# Patient Record
Sex: Male | Born: 1935 | Race: Black or African American | Hispanic: No | Marital: Married | Smoking: Former smoker
Health system: Southern US, Community
[De-identification: ages and names within clinical notes are randomized; demographics above are authoritative.]

## PROBLEM LIST (undated history)

## (undated) DIAGNOSIS — I714 Abdominal aortic aneurysm, without rupture, unspecified: Secondary | ICD-10-CM

## (undated) DIAGNOSIS — E559 Vitamin D deficiency, unspecified: Secondary | ICD-10-CM

## (undated) DIAGNOSIS — I2510 Atherosclerotic heart disease of native coronary artery without angina pectoris: Secondary | ICD-10-CM

## (undated) DIAGNOSIS — E785 Hyperlipidemia, unspecified: Secondary | ICD-10-CM

## (undated) DIAGNOSIS — C801 Malignant (primary) neoplasm, unspecified: Secondary | ICD-10-CM

## (undated) DIAGNOSIS — I10 Essential (primary) hypertension: Secondary | ICD-10-CM

## (undated) DIAGNOSIS — E119 Type 2 diabetes mellitus without complications: Secondary | ICD-10-CM

## (undated) HISTORY — DX: Type 2 diabetes mellitus without complications: E11.9

## (undated) HISTORY — DX: Hyperlipidemia, unspecified: E78.5

## (undated) HISTORY — DX: Atherosclerotic heart disease of native coronary artery without angina pectoris: I25.10

## (undated) HISTORY — DX: Malignant (primary) neoplasm, unspecified: C80.1

## (undated) HISTORY — DX: Essential (primary) hypertension: I10

## (undated) HISTORY — DX: Abdominal aortic aneurysm, without rupture: I71.4

## (undated) HISTORY — DX: Abdominal aortic aneurysm, without rupture, unspecified: I71.40

## (undated) HISTORY — DX: Vitamin D deficiency, unspecified: E55.9

---

## 1997-04-11 HISTORY — PX: CAROTID STENT: SHX1301

## 1997-09-25 ENCOUNTER — Inpatient Hospital Stay (HOSPITAL_COMMUNITY): Admission: RE | Admit: 1997-09-25 | Discharge: 1997-10-01 | Payer: Self-pay | Admitting: Cardiology

## 2000-10-06 HISTORY — PX: HERNIA REPAIR: SHX51

## 2005-03-24 ENCOUNTER — Ambulatory Visit: Payer: Self-pay | Admitting: Family Medicine

## 2005-04-14 ENCOUNTER — Other Ambulatory Visit: Payer: Self-pay

## 2005-04-14 ENCOUNTER — Ambulatory Visit: Payer: Self-pay | Admitting: Vascular Surgery

## 2005-06-22 ENCOUNTER — Inpatient Hospital Stay: Payer: Self-pay | Admitting: Vascular Surgery

## 2009-07-27 LAB — PSA: PSA: 2.7

## 2011-03-18 ENCOUNTER — Emergency Department: Payer: Self-pay | Admitting: Emergency Medicine

## 2013-05-08 ENCOUNTER — Ambulatory Visit: Payer: Self-pay | Admitting: Otolaryngology

## 2013-05-08 LAB — CBC WITH DIFFERENTIAL/PLATELET
Basophil #: 0.1 10*3/uL (ref 0.0–0.1)
Basophil %: 1.2 %
EOS ABS: 0.2 10*3/uL (ref 0.0–0.7)
EOS PCT: 2.9 %
HCT: 42.6 % (ref 40.0–52.0)
HGB: 13.7 g/dL (ref 13.0–18.0)
LYMPHS ABS: 1.1 10*3/uL (ref 1.0–3.6)
Lymphocyte %: 13.8 %
MCH: 28.5 pg (ref 26.0–34.0)
MCHC: 32.2 g/dL (ref 32.0–36.0)
MCV: 89 fL (ref 80–100)
MONO ABS: 0.8 x10 3/mm (ref 0.2–1.0)
MONOS PCT: 9.7 %
NEUTROS PCT: 72.4 %
Neutrophil #: 5.8 10*3/uL (ref 1.4–6.5)
PLATELETS: 243 10*3/uL (ref 150–440)
RBC: 4.82 10*6/uL (ref 4.40–5.90)
RDW: 14.1 % (ref 11.5–14.5)
WBC: 8 10*3/uL (ref 3.8–10.6)

## 2013-05-08 LAB — POTASSIUM: POTASSIUM: 4.4 mmol/L (ref 3.5–5.1)

## 2013-05-09 ENCOUNTER — Ambulatory Visit: Payer: Self-pay | Admitting: Otolaryngology

## 2013-05-09 ENCOUNTER — Ambulatory Visit: Payer: Self-pay | Admitting: Hematology and Oncology

## 2013-05-12 ENCOUNTER — Ambulatory Visit: Payer: Self-pay | Admitting: Hematology and Oncology

## 2013-05-12 DIAGNOSIS — C801 Malignant (primary) neoplasm, unspecified: Secondary | ICD-10-CM

## 2013-05-12 HISTORY — DX: Malignant (primary) neoplasm, unspecified: C80.1

## 2013-05-12 HISTORY — PX: PORTACATH PLACEMENT: SHX2246

## 2013-05-16 ENCOUNTER — Ambulatory Visit: Payer: Self-pay | Admitting: Hematology and Oncology

## 2013-05-16 LAB — PATHOLOGY REPORT

## 2013-05-20 ENCOUNTER — Ambulatory Visit: Payer: Self-pay | Admitting: Vascular Surgery

## 2013-05-23 LAB — CBC CANCER CENTER
BASOS PCT: 0.9 %
Basophil #: 0.1 x10 3/mm (ref 0.0–0.1)
Eosinophil #: 0.4 x10 3/mm (ref 0.0–0.7)
Eosinophil %: 5.1 %
HCT: 41.2 % (ref 40.0–52.0)
HGB: 13.4 g/dL (ref 13.0–18.0)
LYMPHS ABS: 1.1 x10 3/mm (ref 1.0–3.6)
LYMPHS PCT: 12.8 %
MCH: 28.6 pg (ref 26.0–34.0)
MCHC: 32.4 g/dL (ref 32.0–36.0)
MCV: 89 fL (ref 80–100)
MONO ABS: 0.5 x10 3/mm (ref 0.2–1.0)
MONOS PCT: 6.3 %
NEUTROS PCT: 74.9 %
Neutrophil #: 6.2 x10 3/mm (ref 1.4–6.5)
Platelet: 237 x10 3/mm (ref 150–440)
RBC: 4.66 10*6/uL (ref 4.40–5.90)
RDW: 14.1 % (ref 11.5–14.5)
WBC: 8.3 x10 3/mm (ref 3.8–10.6)

## 2013-05-23 LAB — COMPREHENSIVE METABOLIC PANEL
Albumin: 3.5 g/dL (ref 3.4–5.0)
Alkaline Phosphatase: 65 U/L
Anion Gap: 10 (ref 7–16)
BUN: 21 mg/dL — AB (ref 7–18)
Bilirubin,Total: 0.7 mg/dL (ref 0.2–1.0)
CALCIUM: 8.6 mg/dL (ref 8.5–10.1)
Chloride: 97 mmol/L — ABNORMAL LOW (ref 98–107)
Co2: 26 mmol/L (ref 21–32)
Creatinine: 1.57 mg/dL — ABNORMAL HIGH (ref 0.60–1.30)
EGFR (African American): 48 — ABNORMAL LOW
GFR CALC NON AF AMER: 42 — AB
Glucose: 217 mg/dL — ABNORMAL HIGH (ref 65–99)
Osmolality: 276 (ref 275–301)
POTASSIUM: 4.1 mmol/L (ref 3.5–5.1)
SGOT(AST): 24 U/L (ref 15–37)
SGPT (ALT): 32 U/L (ref 12–78)
Sodium: 133 mmol/L — ABNORMAL LOW (ref 136–145)
Total Protein: 7.3 g/dL (ref 6.4–8.2)

## 2013-06-09 ENCOUNTER — Ambulatory Visit: Payer: Self-pay | Admitting: Hematology and Oncology

## 2013-06-11 LAB — CBC CANCER CENTER
Basophil #: 0.1 x10 3/mm (ref 0.0–0.1)
Basophil %: 1 %
EOS ABS: 0.3 x10 3/mm (ref 0.0–0.7)
EOS PCT: 4.4 %
HCT: 43.5 % (ref 40.0–52.0)
HGB: 14 g/dL (ref 13.0–18.0)
LYMPHS ABS: 1.8 x10 3/mm (ref 1.0–3.6)
LYMPHS PCT: 23.8 %
MCH: 28.7 pg (ref 26.0–34.0)
MCHC: 32.2 g/dL (ref 32.0–36.0)
MCV: 89 fL (ref 80–100)
MONOS PCT: 7.5 %
Monocyte #: 0.6 x10 3/mm (ref 0.2–1.0)
NEUTROS ABS: 4.8 x10 3/mm (ref 1.4–6.5)
Neutrophil %: 63.3 %
PLATELETS: 241 x10 3/mm (ref 150–440)
RBC: 4.89 10*6/uL (ref 4.40–5.90)
RDW: 14.5 % (ref 11.5–14.5)
WBC: 7.6 x10 3/mm (ref 3.8–10.6)

## 2013-06-11 LAB — COMPREHENSIVE METABOLIC PANEL
ALBUMIN: 4 g/dL (ref 3.4–5.0)
Alkaline Phosphatase: 61 U/L
Anion Gap: 13 (ref 7–16)
BILIRUBIN TOTAL: 0.8 mg/dL (ref 0.2–1.0)
BUN: 19 mg/dL — ABNORMAL HIGH (ref 7–18)
CALCIUM: 8.6 mg/dL (ref 8.5–10.1)
CHLORIDE: 95 mmol/L — AB (ref 98–107)
CO2: 25 mmol/L (ref 21–32)
Creatinine: 1.6 mg/dL — ABNORMAL HIGH (ref 0.60–1.30)
EGFR (African American): 47 — ABNORMAL LOW
EGFR (Non-African Amer.): 41 — ABNORMAL LOW
Glucose: 289 mg/dL — ABNORMAL HIGH (ref 65–99)
OSMOLALITY: 279 (ref 275–301)
Potassium: 4.2 mmol/L (ref 3.5–5.1)
SGOT(AST): 18 U/L (ref 15–37)
SGPT (ALT): 31 U/L (ref 12–78)
SODIUM: 133 mmol/L — AB (ref 136–145)
Total Protein: 7.6 g/dL (ref 6.4–8.2)

## 2013-06-18 LAB — CBC CANCER CENTER
Basophil #: 0.1 x10 3/mm (ref 0.0–0.1)
Basophil %: 0.8 %
EOS ABS: 0.3 x10 3/mm (ref 0.0–0.7)
Eosinophil %: 3.2 %
HCT: 40.9 % (ref 40.0–52.0)
HGB: 13.3 g/dL (ref 13.0–18.0)
Lymphocyte #: 1.4 x10 3/mm (ref 1.0–3.6)
Lymphocyte %: 15.9 %
MCH: 28.6 pg (ref 26.0–34.0)
MCHC: 32.5 g/dL (ref 32.0–36.0)
MCV: 88 fL (ref 80–100)
Monocyte #: 0.6 x10 3/mm (ref 0.2–1.0)
Monocyte %: 7 %
NEUTROS PCT: 73.1 %
Neutrophil #: 6.6 x10 3/mm — ABNORMAL HIGH (ref 1.4–6.5)
Platelet: 229 x10 3/mm (ref 150–440)
RBC: 4.65 10*6/uL (ref 4.40–5.90)
RDW: 14.2 % (ref 11.5–14.5)
WBC: 9 x10 3/mm (ref 3.8–10.6)

## 2013-06-18 LAB — BASIC METABOLIC PANEL
Anion Gap: 8 (ref 7–16)
BUN: 23 mg/dL — ABNORMAL HIGH (ref 7–18)
CALCIUM: 9 mg/dL (ref 8.5–10.1)
CO2: 28 mmol/L (ref 21–32)
CREATININE: 1.46 mg/dL — AB (ref 0.60–1.30)
Chloride: 93 mmol/L — ABNORMAL LOW (ref 98–107)
EGFR (African American): 53 — ABNORMAL LOW
EGFR (Non-African Amer.): 45 — ABNORMAL LOW
GLUCOSE: 253 mg/dL — AB (ref 65–99)
Osmolality: 271 (ref 275–301)
POTASSIUM: 4.2 mmol/L (ref 3.5–5.1)
Sodium: 129 mmol/L — ABNORMAL LOW (ref 136–145)

## 2013-06-25 LAB — CBC CANCER CENTER
BASOS ABS: 0 x10 3/mm (ref 0.0–0.1)
Basophil %: 0.5 %
Eosinophil #: 0.1 x10 3/mm (ref 0.0–0.7)
Eosinophil %: 1 %
HCT: 37.2 % — ABNORMAL LOW (ref 40.0–52.0)
HGB: 12.1 g/dL — AB (ref 13.0–18.0)
LYMPHS ABS: 0.7 x10 3/mm — AB (ref 1.0–3.6)
Lymphocyte %: 7.9 %
MCH: 28.5 pg (ref 26.0–34.0)
MCHC: 32.4 g/dL (ref 32.0–36.0)
MCV: 88 fL (ref 80–100)
Monocyte #: 0.7 x10 3/mm (ref 0.2–1.0)
Monocyte %: 7.8 %
Neutrophil #: 7.3 x10 3/mm — ABNORMAL HIGH (ref 1.4–6.5)
Neutrophil %: 82.8 %
Platelet: 202 x10 3/mm (ref 150–440)
RBC: 4.22 10*6/uL — ABNORMAL LOW (ref 4.40–5.90)
RDW: 13.9 % (ref 11.5–14.5)
WBC: 8.8 x10 3/mm (ref 3.8–10.6)

## 2013-06-25 LAB — BASIC METABOLIC PANEL
Anion Gap: 8 (ref 7–16)
BUN: 24 mg/dL — ABNORMAL HIGH (ref 7–18)
CALCIUM: 8.5 mg/dL (ref 8.5–10.1)
CHLORIDE: 91 mmol/L — AB (ref 98–107)
Co2: 26 mmol/L (ref 21–32)
Creatinine: 1.43 mg/dL — ABNORMAL HIGH (ref 0.60–1.30)
EGFR (African American): 54 — ABNORMAL LOW
GFR CALC NON AF AMER: 47 — AB
Glucose: 198 mg/dL — ABNORMAL HIGH (ref 65–99)
OSMOLALITY: 261 (ref 275–301)
Potassium: 4.5 mmol/L (ref 3.5–5.1)
SODIUM: 125 mmol/L — AB (ref 136–145)

## 2013-07-02 LAB — BASIC METABOLIC PANEL
ANION GAP: 9 (ref 7–16)
BUN: 29 mg/dL — ABNORMAL HIGH (ref 7–18)
CALCIUM: 8.7 mg/dL (ref 8.5–10.1)
CHLORIDE: 91 mmol/L — AB (ref 98–107)
Co2: 26 mmol/L (ref 21–32)
Creatinine: 1.61 mg/dL — ABNORMAL HIGH (ref 0.60–1.30)
EGFR (Non-African Amer.): 40 — ABNORMAL LOW
GFR CALC AF AMER: 47 — AB
Glucose: 251 mg/dL — ABNORMAL HIGH (ref 65–99)
OSMOLALITY: 268 (ref 275–301)
POTASSIUM: 4.8 mmol/L (ref 3.5–5.1)
SODIUM: 126 mmol/L — AB (ref 136–145)

## 2013-07-02 LAB — CBC CANCER CENTER
Basophil #: 0 x10 3/mm (ref 0.0–0.1)
Basophil %: 0.6 %
EOS PCT: 1.4 %
Eosinophil #: 0.1 x10 3/mm (ref 0.0–0.7)
HCT: 37.9 % — ABNORMAL LOW (ref 40.0–52.0)
HGB: 12.3 g/dL — AB (ref 13.0–18.0)
Lymphocyte #: 0.7 x10 3/mm — ABNORMAL LOW (ref 1.0–3.6)
Lymphocyte %: 9.6 %
MCH: 28.7 pg (ref 26.0–34.0)
MCHC: 32.4 g/dL (ref 32.0–36.0)
MCV: 89 fL (ref 80–100)
MONO ABS: 0.5 x10 3/mm (ref 0.2–1.0)
MONOS PCT: 7.4 %
NEUTROS ABS: 5.7 x10 3/mm (ref 1.4–6.5)
Neutrophil %: 81 %
Platelet: 203 x10 3/mm (ref 150–440)
RBC: 4.28 10*6/uL — ABNORMAL LOW (ref 4.40–5.90)
RDW: 14 % (ref 11.5–14.5)
WBC: 7 x10 3/mm (ref 3.8–10.6)

## 2013-07-05 LAB — CBC CANCER CENTER
Basophil #: 0 x10 3/mm (ref 0.0–0.1)
Basophil %: 0.6 %
EOS PCT: 1.5 %
Eosinophil #: 0.1 x10 3/mm (ref 0.0–0.7)
HCT: 36.7 % — AB (ref 40.0–52.0)
HGB: 11.8 g/dL — ABNORMAL LOW (ref 13.0–18.0)
LYMPHS ABS: 0.3 x10 3/mm — AB (ref 1.0–3.6)
Lymphocyte %: 7.1 %
MCH: 28.6 pg (ref 26.0–34.0)
MCHC: 32.2 g/dL (ref 32.0–36.0)
MCV: 89 fL (ref 80–100)
Monocyte #: 0.5 x10 3/mm (ref 0.2–1.0)
Monocyte %: 9.7 %
Neutrophil #: 3.9 x10 3/mm (ref 1.4–6.5)
Neutrophil %: 81.1 %
Platelet: 164 x10 3/mm (ref 150–440)
RBC: 4.13 10*6/uL — AB (ref 4.40–5.90)
RDW: 14 % (ref 11.5–14.5)
WBC: 4.8 x10 3/mm (ref 3.8–10.6)

## 2013-07-05 LAB — BASIC METABOLIC PANEL
ANION GAP: 10 (ref 7–16)
BUN: 29 mg/dL — ABNORMAL HIGH (ref 7–18)
CO2: 27 mmol/L (ref 21–32)
Calcium, Total: 8.8 mg/dL (ref 8.5–10.1)
Chloride: 89 mmol/L — ABNORMAL LOW (ref 98–107)
Creatinine: 1.57 mg/dL — ABNORMAL HIGH (ref 0.60–1.30)
EGFR (African American): 48 — ABNORMAL LOW
EGFR (Non-African Amer.): 42 — ABNORMAL LOW
Glucose: 201 mg/dL — ABNORMAL HIGH (ref 65–99)
OSMOLALITY: 265 (ref 275–301)
POTASSIUM: 4.8 mmol/L (ref 3.5–5.1)
Sodium: 126 mmol/L — ABNORMAL LOW (ref 136–145)

## 2013-07-10 ENCOUNTER — Ambulatory Visit: Payer: Self-pay | Admitting: Hematology and Oncology

## 2013-07-12 LAB — CBC CANCER CENTER
BASOS ABS: 0 x10 3/mm (ref 0.0–0.1)
Basophil %: 0.5 %
EOS ABS: 0.1 x10 3/mm (ref 0.0–0.7)
Eosinophil %: 2.2 %
HCT: 36.6 % — ABNORMAL LOW (ref 40.0–52.0)
HGB: 11.8 g/dL — AB (ref 13.0–18.0)
Lymphocyte #: 0.6 x10 3/mm — ABNORMAL LOW (ref 1.0–3.6)
Lymphocyte %: 17.3 %
MCH: 28.7 pg (ref 26.0–34.0)
MCHC: 32.1 g/dL (ref 32.0–36.0)
MCV: 90 fL (ref 80–100)
MONOS PCT: 14.6 %
Monocyte #: 0.5 x10 3/mm (ref 0.2–1.0)
NEUTROS PCT: 65.4 %
Neutrophil #: 2.4 x10 3/mm (ref 1.4–6.5)
Platelet: 136 x10 3/mm — ABNORMAL LOW (ref 150–440)
RBC: 4.09 10*6/uL — ABNORMAL LOW (ref 4.40–5.90)
RDW: 14.9 % — ABNORMAL HIGH (ref 11.5–14.5)
WBC: 3.7 x10 3/mm — ABNORMAL LOW (ref 3.8–10.6)

## 2013-07-12 LAB — COMPREHENSIVE METABOLIC PANEL
ALT: 20 U/L (ref 12–78)
Albumin: 3.5 g/dL (ref 3.4–5.0)
Alkaline Phosphatase: 65 U/L
Anion Gap: 10 (ref 7–16)
BILIRUBIN TOTAL: 0.3 mg/dL (ref 0.2–1.0)
BUN: 34 mg/dL — ABNORMAL HIGH (ref 7–18)
CHLORIDE: 90 mmol/L — AB (ref 98–107)
Calcium, Total: 8.9 mg/dL (ref 8.5–10.1)
Co2: 26 mmol/L (ref 21–32)
Creatinine: 1.64 mg/dL — ABNORMAL HIGH (ref 0.60–1.30)
EGFR (African American): 46 — ABNORMAL LOW
EGFR (Non-African Amer.): 39 — ABNORMAL LOW
GLUCOSE: 235 mg/dL — AB (ref 65–99)
Osmolality: 269 (ref 275–301)
Potassium: 4.8 mmol/L (ref 3.5–5.1)
SGOT(AST): 13 U/L — ABNORMAL LOW (ref 15–37)
Sodium: 126 mmol/L — ABNORMAL LOW (ref 136–145)
TOTAL PROTEIN: 7.1 g/dL (ref 6.4–8.2)

## 2013-07-19 LAB — BASIC METABOLIC PANEL
Anion Gap: 10 (ref 7–16)
BUN: 38 mg/dL — ABNORMAL HIGH (ref 7–18)
Calcium, Total: 9 mg/dL (ref 8.5–10.1)
Chloride: 91 mmol/L — ABNORMAL LOW (ref 98–107)
Co2: 26 mmol/L (ref 21–32)
Creatinine: 1.85 mg/dL — ABNORMAL HIGH (ref 0.60–1.30)
EGFR (Non-African Amer.): 34 — ABNORMAL LOW
GFR CALC AF AMER: 40 — AB
Glucose: 241 mg/dL — ABNORMAL HIGH (ref 65–99)
Osmolality: 272 (ref 275–301)
Potassium: 4.5 mmol/L (ref 3.5–5.1)
Sodium: 127 mmol/L — ABNORMAL LOW (ref 136–145)

## 2013-07-19 LAB — CBC CANCER CENTER
BASOS PCT: 0.3 %
Basophil #: 0 x10 3/mm (ref 0.0–0.1)
EOS ABS: 0 x10 3/mm (ref 0.0–0.7)
EOS PCT: 1.2 %
HCT: 36.2 % — AB (ref 40.0–52.0)
HGB: 11.8 g/dL — AB (ref 13.0–18.0)
LYMPHS ABS: 0.6 x10 3/mm — AB (ref 1.0–3.6)
Lymphocyte %: 17 %
MCH: 29.2 pg (ref 26.0–34.0)
MCHC: 32.7 g/dL (ref 32.0–36.0)
MCV: 89 fL (ref 80–100)
Monocyte #: 0.4 x10 3/mm (ref 0.2–1.0)
Monocyte %: 10.2 %
Neutrophil #: 2.6 x10 3/mm (ref 1.4–6.5)
Neutrophil %: 71.3 %
PLATELETS: 216 x10 3/mm (ref 150–440)
RBC: 4.06 10*6/uL — ABNORMAL LOW (ref 4.40–5.90)
RDW: 15 % — ABNORMAL HIGH (ref 11.5–14.5)
WBC: 3.6 x10 3/mm — ABNORMAL LOW (ref 3.8–10.6)

## 2013-08-08 LAB — CBC CANCER CENTER
Basophil #: 0 x10 3/mm (ref 0.0–0.1)
Basophil %: 0.3 %
EOS ABS: 0.1 x10 3/mm (ref 0.0–0.7)
Eosinophil %: 1.7 %
HCT: 34 % — ABNORMAL LOW (ref 40.0–52.0)
HGB: 11.5 g/dL — ABNORMAL LOW (ref 13.0–18.0)
LYMPHS PCT: 11.2 %
Lymphocyte #: 0.7 x10 3/mm — ABNORMAL LOW (ref 1.0–3.6)
MCH: 30.1 pg (ref 26.0–34.0)
MCHC: 33.9 g/dL (ref 32.0–36.0)
MCV: 89 fL (ref 80–100)
MONO ABS: 0.7 x10 3/mm (ref 0.2–1.0)
Monocyte %: 11.2 %
NEUTROS ABS: 4.4 x10 3/mm (ref 1.4–6.5)
Neutrophil %: 75.6 %
Platelet: 70 x10 3/mm — ABNORMAL LOW (ref 150–440)
RBC: 3.83 10*6/uL — AB (ref 4.40–5.90)
RDW: 16.6 % — AB (ref 11.5–14.5)
WBC: 5.9 x10 3/mm (ref 3.8–10.6)

## 2013-08-08 LAB — COMPREHENSIVE METABOLIC PANEL
ALBUMIN: 3.8 g/dL (ref 3.4–5.0)
Alkaline Phosphatase: 71 U/L
Anion Gap: 10 (ref 7–16)
BUN: 41 mg/dL — AB (ref 7–18)
Bilirubin,Total: 0.5 mg/dL (ref 0.2–1.0)
CALCIUM: 9.5 mg/dL (ref 8.5–10.1)
Chloride: 93 mmol/L — ABNORMAL LOW (ref 98–107)
Co2: 26 mmol/L (ref 21–32)
Creatinine: 1.92 mg/dL — ABNORMAL HIGH (ref 0.60–1.30)
EGFR (African American): 38 — ABNORMAL LOW
EGFR (Non-African Amer.): 33 — ABNORMAL LOW
GLUCOSE: 170 mg/dL — AB (ref 65–99)
OSMOLALITY: 273 (ref 275–301)
POTASSIUM: 4.7 mmol/L (ref 3.5–5.1)
SGOT(AST): 19 U/L (ref 15–37)
SGPT (ALT): 22 U/L (ref 12–78)
Sodium: 129 mmol/L — ABNORMAL LOW (ref 136–145)
Total Protein: 7.4 g/dL (ref 6.4–8.2)

## 2013-08-09 ENCOUNTER — Ambulatory Visit: Payer: Self-pay | Admitting: Hematology and Oncology

## 2013-08-23 LAB — BASIC METABOLIC PANEL
Anion Gap: 7 (ref 7–16)
BUN: 51 mg/dL — ABNORMAL HIGH (ref 7–18)
CALCIUM: 8.8 mg/dL (ref 8.5–10.1)
Chloride: 95 mmol/L — ABNORMAL LOW (ref 98–107)
Co2: 28 mmol/L (ref 21–32)
Creatinine: 2.07 mg/dL — ABNORMAL HIGH (ref 0.60–1.30)
EGFR (African American): 35 — ABNORMAL LOW
EGFR (Non-African Amer.): 30 — ABNORMAL LOW
GLUCOSE: 152 mg/dL — AB (ref 65–99)
Osmolality: 277 (ref 275–301)
Potassium: 4.8 mmol/L (ref 3.5–5.1)
Sodium: 130 mmol/L — ABNORMAL LOW (ref 136–145)

## 2013-08-30 LAB — BASIC METABOLIC PANEL
ANION GAP: 10 (ref 7–16)
BUN: 18 mg/dL (ref 7–18)
CHLORIDE: 100 mmol/L (ref 98–107)
CO2: 26 mmol/L (ref 21–32)
Calcium, Total: 8.7 mg/dL (ref 8.5–10.1)
Creatinine: 1.51 mg/dL — ABNORMAL HIGH (ref 0.60–1.30)
EGFR (African American): 51 — ABNORMAL LOW
EGFR (Non-African Amer.): 44 — ABNORMAL LOW
Glucose: 182 mg/dL — ABNORMAL HIGH (ref 65–99)
OSMOLALITY: 278 (ref 275–301)
Potassium: 4.6 mmol/L (ref 3.5–5.1)
Sodium: 136 mmol/L (ref 136–145)

## 2013-09-05 LAB — BASIC METABOLIC PANEL
ANION GAP: 9 (ref 7–16)
BUN: 15 mg/dL (ref 7–18)
CALCIUM: 8.4 mg/dL — AB (ref 8.5–10.1)
CO2: 26 mmol/L (ref 21–32)
Chloride: 103 mmol/L (ref 98–107)
Creatinine: 1.39 mg/dL — ABNORMAL HIGH (ref 0.60–1.30)
EGFR (African American): 56 — ABNORMAL LOW
EGFR (Non-African Amer.): 48 — ABNORMAL LOW
GLUCOSE: 187 mg/dL — AB (ref 65–99)
Osmolality: 281 (ref 275–301)
POTASSIUM: 5 mmol/L (ref 3.5–5.1)
Sodium: 138 mmol/L (ref 136–145)

## 2013-09-09 ENCOUNTER — Ambulatory Visit: Payer: Self-pay | Admitting: Hematology and Oncology

## 2013-11-11 ENCOUNTER — Ambulatory Visit: Payer: Self-pay | Admitting: Hematology and Oncology

## 2013-11-14 ENCOUNTER — Ambulatory Visit: Payer: Self-pay | Admitting: Hematology and Oncology

## 2013-11-14 LAB — COMPREHENSIVE METABOLIC PANEL
ALT: 25 U/L
ANION GAP: 7 (ref 7–16)
AST: 24 U/L (ref 15–37)
Albumin: 3.9 g/dL (ref 3.4–5.0)
Alkaline Phosphatase: 63 U/L
BUN: 24 mg/dL — AB (ref 7–18)
Bilirubin,Total: 0.5 mg/dL (ref 0.2–1.0)
CALCIUM: 8.8 mg/dL (ref 8.5–10.1)
Chloride: 102 mmol/L (ref 98–107)
Co2: 26 mmol/L (ref 21–32)
Creatinine: 1.57 mg/dL — ABNORMAL HIGH (ref 0.60–1.30)
EGFR (African American): 48 — ABNORMAL LOW
GFR CALC NON AF AMER: 42 — AB
GLUCOSE: 110 mg/dL — AB (ref 65–99)
Osmolality: 275 (ref 275–301)
POTASSIUM: 4.4 mmol/L (ref 3.5–5.1)
Sodium: 135 mmol/L — ABNORMAL LOW (ref 136–145)
Total Protein: 7.2 g/dL (ref 6.4–8.2)

## 2013-11-14 LAB — CBC CANCER CENTER
Basophil #: 0.1 x10 3/mm (ref 0.0–0.1)
Basophil %: 1.1 %
EOS ABS: 0.4 x10 3/mm (ref 0.0–0.7)
Eosinophil %: 5.8 %
HCT: 36.7 % — AB (ref 40.0–52.0)
HGB: 11.7 g/dL — ABNORMAL LOW (ref 13.0–18.0)
LYMPHS ABS: 0.9 x10 3/mm — AB (ref 1.0–3.6)
LYMPHS PCT: 12.6 %
MCH: 30.7 pg (ref 26.0–34.0)
MCHC: 31.8 g/dL — ABNORMAL LOW (ref 32.0–36.0)
MCV: 96 fL (ref 80–100)
Monocyte #: 0.8 x10 3/mm (ref 0.2–1.0)
Monocyte %: 11.8 %
NEUTROS PCT: 68.7 %
Neutrophil #: 4.7 x10 3/mm (ref 1.4–6.5)
PLATELETS: 180 x10 3/mm (ref 150–440)
RBC: 3.81 10*6/uL — AB (ref 4.40–5.90)
RDW: 13 % (ref 11.5–14.5)
WBC: 6.8 x10 3/mm (ref 3.8–10.6)

## 2013-11-19 ENCOUNTER — Inpatient Hospital Stay: Payer: Self-pay | Admitting: Oncology

## 2013-11-19 LAB — BASIC METABOLIC PANEL
Anion Gap: 21 — ABNORMAL HIGH (ref 7–16)
BUN: 52 mg/dL — ABNORMAL HIGH (ref 7–18)
CALCIUM: 9.8 mg/dL (ref 8.5–10.1)
CREATININE: 4.56 mg/dL — AB (ref 0.60–1.30)
Chloride: 92 mmol/L — ABNORMAL LOW (ref 98–107)
Co2: 15 mmol/L — ABNORMAL LOW (ref 21–32)
EGFR (African American): 13 — ABNORMAL LOW
EGFR (Non-African Amer.): 11 — ABNORMAL LOW
GLUCOSE: 180 mg/dL — AB (ref 65–99)
Osmolality: 276 (ref 275–301)
POTASSIUM: 4 mmol/L (ref 3.5–5.1)
Sodium: 128 mmol/L — ABNORMAL LOW (ref 136–145)

## 2013-11-19 LAB — CBC CANCER CENTER
BASOS ABS: 0 x10 3/mm (ref 0.0–0.1)
Basophil %: 0.1 %
Eosinophil #: 0 x10 3/mm (ref 0.0–0.7)
Eosinophil %: 0 %
HCT: 45.1 % (ref 40.0–52.0)
HGB: 14.6 g/dL (ref 13.0–18.0)
LYMPHS ABS: 0.3 x10 3/mm — AB (ref 1.0–3.6)
Lymphocyte %: 5 %
MCH: 30.8 pg (ref 26.0–34.0)
MCHC: 32.4 g/dL (ref 32.0–36.0)
MCV: 95 fL (ref 80–100)
Monocyte #: 0.5 x10 3/mm (ref 0.2–1.0)
Monocyte %: 7.8 %
NEUTROS ABS: 5.5 x10 3/mm (ref 1.4–6.5)
NEUTROS PCT: 87.1 %
Platelet: 235 x10 3/mm (ref 150–440)
RBC: 4.76 10*6/uL (ref 4.40–5.90)
RDW: 13.2 % (ref 11.5–14.5)
WBC: 6.4 x10 3/mm (ref 3.8–10.6)

## 2013-11-19 LAB — MAGNESIUM: Magnesium: 2.2 mg/dL

## 2013-11-19 LAB — CLOSTRIDIUM DIFFICILE(ARMC)

## 2013-11-20 LAB — CBC WITH DIFFERENTIAL/PLATELET
Basophil #: 0 10*3/uL (ref 0.0–0.1)
Basophil %: 0.1 %
Eosinophil #: 0 10*3/uL (ref 0.0–0.7)
Eosinophil %: 0.1 %
HCT: 36 % — AB (ref 40.0–52.0)
HGB: 11.6 g/dL — ABNORMAL LOW (ref 13.0–18.0)
Lymphocyte #: 0.2 10*3/uL — ABNORMAL LOW (ref 1.0–3.6)
Lymphocyte %: 2.8 %
MCH: 30.5 pg (ref 26.0–34.0)
MCHC: 32.2 g/dL (ref 32.0–36.0)
MCV: 95 fL (ref 80–100)
MONO ABS: 0.5 x10 3/mm (ref 0.2–1.0)
Monocyte %: 8.5 %
Neutrophil #: 5.1 10*3/uL (ref 1.4–6.5)
Neutrophil %: 88.5 %
PLATELETS: 169 10*3/uL (ref 150–440)
RBC: 3.8 10*6/uL — ABNORMAL LOW (ref 4.40–5.90)
RDW: 13.3 % (ref 11.5–14.5)
WBC: 5.7 10*3/uL (ref 3.8–10.6)

## 2013-11-20 LAB — BASIC METABOLIC PANEL
Anion Gap: 11 (ref 7–16)
BUN: 57 mg/dL — ABNORMAL HIGH (ref 7–18)
CREATININE: 4.13 mg/dL — AB (ref 0.60–1.30)
Calcium, Total: 7.6 mg/dL — ABNORMAL LOW (ref 8.5–10.1)
Chloride: 106 mmol/L (ref 98–107)
Co2: 13 mmol/L — ABNORMAL LOW (ref 21–32)
EGFR (African American): 15 — ABNORMAL LOW
EGFR (Non-African Amer.): 13 — ABNORMAL LOW
Glucose: 109 mg/dL — ABNORMAL HIGH (ref 65–99)
Osmolality: 277 (ref 275–301)
Potassium: 3.4 mmol/L — ABNORMAL LOW (ref 3.5–5.1)
Sodium: 130 mmol/L — ABNORMAL LOW (ref 136–145)

## 2013-11-20 LAB — WBCS, STOOL

## 2013-11-21 LAB — COMPREHENSIVE METABOLIC PANEL WITH GFR
Albumin: 2.6 g/dL — ABNORMAL LOW
Alkaline Phosphatase: 49 U/L
Anion Gap: 5 — ABNORMAL LOW
BUN: 46 mg/dL — ABNORMAL HIGH
Bilirubin,Total: 0.3 mg/dL
Calcium, Total: 7.5 mg/dL — ABNORMAL LOW
Chloride: 110 mmol/L — ABNORMAL HIGH
Co2: 17 mmol/L — ABNORMAL LOW
Creatinine: 2.69 mg/dL — ABNORMAL HIGH
EGFR (African American): 25 — ABNORMAL LOW
EGFR (Non-African Amer.): 22 — ABNORMAL LOW
Glucose: 96 mg/dL
Osmolality: 276
Potassium: 3.1 mmol/L — ABNORMAL LOW
SGOT(AST): 22 U/L
SGPT (ALT): 19 U/L
Sodium: 132 mmol/L — ABNORMAL LOW
Total Protein: 6 g/dL — ABNORMAL LOW

## 2013-11-21 LAB — CBC WITH DIFFERENTIAL/PLATELET
Basophil #: 0 x10 3/mm 3
Basophil %: 0.3 %
Eosinophil #: 0 x10 3/mm 3
Eosinophil %: 1 %
HCT: 31.9 % — ABNORMAL LOW
HGB: 10.6 g/dL — ABNORMAL LOW
Lymphocyte %: 5.9 %
Lymphs Abs: 0.2 x10 3/mm 3 — ABNORMAL LOW
MCH: 31 pg
MCHC: 33.2 g/dL
MCV: 93 fL
Monocyte #: 0.7 "x10 3/mm "
Monocyte %: 17 %
Neutrophil #: 3 x10 3/mm 3
Neutrophil %: 75.8 %
Platelet: 146 x10 3/mm 3 — ABNORMAL LOW
RBC: 3.41 x10 6/mm 3 — ABNORMAL LOW
RDW: 12.9 %
WBC: 3.9 x10 3/mm 3

## 2013-11-25 LAB — COMPREHENSIVE METABOLIC PANEL
ALT: 48 U/L
Albumin: 3.1 g/dL — ABNORMAL LOW (ref 3.4–5.0)
Alkaline Phosphatase: 68 U/L
Anion Gap: 10 (ref 7–16)
BILIRUBIN TOTAL: 0.7 mg/dL (ref 0.2–1.0)
BUN: 32 mg/dL — ABNORMAL HIGH (ref 7–18)
CHLORIDE: 103 mmol/L (ref 98–107)
CO2: 20 mmol/L — AB (ref 21–32)
Calcium, Total: 8.8 mg/dL (ref 8.5–10.1)
Creatinine: 1.96 mg/dL — ABNORMAL HIGH (ref 0.60–1.30)
EGFR (Non-African Amer.): 32 — ABNORMAL LOW
GFR CALC AF AMER: 37 — AB
GLUCOSE: 151 mg/dL — AB (ref 65–99)
Osmolality: 276 (ref 275–301)
Potassium: 4 mmol/L (ref 3.5–5.1)
SGOT(AST): 37 U/L (ref 15–37)
Sodium: 133 mmol/L — ABNORMAL LOW (ref 136–145)
TOTAL PROTEIN: 7.2 g/dL (ref 6.4–8.2)

## 2013-11-25 LAB — CBC CANCER CENTER
BASOS ABS: 0.1 x10 3/mm (ref 0.0–0.1)
Basophil %: 0.7 %
EOS ABS: 0.3 x10 3/mm (ref 0.0–0.7)
EOS PCT: 2.7 %
HCT: 38.3 % — ABNORMAL LOW (ref 40.0–52.0)
HGB: 12.8 g/dL — ABNORMAL LOW (ref 13.0–18.0)
LYMPHS PCT: 5.8 %
Lymphocyte #: 0.6 x10 3/mm — ABNORMAL LOW (ref 1.0–3.6)
MCH: 30.5 pg (ref 26.0–34.0)
MCHC: 33.4 g/dL (ref 32.0–36.0)
MCV: 91 fL (ref 80–100)
MONO ABS: 0.8 x10 3/mm (ref 0.2–1.0)
Monocyte %: 8.1 %
Neutrophil #: 8.3 x10 3/mm — ABNORMAL HIGH (ref 1.4–6.5)
Neutrophil %: 82.7 %
Platelet: 200 x10 3/mm (ref 150–440)
RBC: 4.2 10*6/uL — AB (ref 4.40–5.90)
RDW: 13.4 % (ref 11.5–14.5)
WBC: 10 x10 3/mm (ref 3.8–10.6)

## 2013-11-28 ENCOUNTER — Ambulatory Visit: Payer: Self-pay | Admitting: Oncology

## 2013-11-28 HISTORY — PX: AXILLARY LYMPH NODE BIOPSY: SHX5737

## 2013-12-05 LAB — PATHOLOGY REPORT

## 2013-12-10 ENCOUNTER — Ambulatory Visit: Payer: Self-pay | Admitting: Hematology and Oncology

## 2013-12-10 LAB — BASIC METABOLIC PANEL
ANION GAP: 10 (ref 7–16)
BUN: 17 mg/dL (ref 7–18)
Calcium, Total: 9.1 mg/dL (ref 8.5–10.1)
Chloride: 100 mmol/L (ref 98–107)
Co2: 26 mmol/L (ref 21–32)
Creatinine: 1.63 mg/dL — ABNORMAL HIGH (ref 0.60–1.30)
EGFR (Non-African Amer.): 40 — ABNORMAL LOW
GFR CALC AF AMER: 46 — AB
GLUCOSE: 154 mg/dL — AB (ref 65–99)
Osmolality: 277 (ref 275–301)
Potassium: 4.9 mmol/L (ref 3.5–5.1)
SODIUM: 136 mmol/L (ref 136–145)

## 2013-12-10 LAB — CBC CANCER CENTER
BASOS ABS: 0 x10 3/mm (ref 0.0–0.1)
BASOS PCT: 0.6 %
Eosinophil #: 0.5 x10 3/mm (ref 0.0–0.7)
Eosinophil %: 9.3 %
HCT: 37 % — AB (ref 40.0–52.0)
HGB: 11.9 g/dL — AB (ref 13.0–18.0)
LYMPHS PCT: 12 %
Lymphocyte #: 0.6 x10 3/mm — ABNORMAL LOW (ref 1.0–3.6)
MCH: 29 pg (ref 26.0–34.0)
MCHC: 32.1 g/dL (ref 32.0–36.0)
MCV: 91 fL (ref 80–100)
MONOS PCT: 12.8 %
Monocyte #: 0.7 x10 3/mm (ref 0.2–1.0)
NEUTROS ABS: 3.4 x10 3/mm (ref 1.4–6.5)
NEUTROS PCT: 65.3 %
PLATELETS: 262 x10 3/mm (ref 150–440)
RBC: 4.09 10*6/uL — ABNORMAL LOW (ref 4.40–5.90)
RDW: 13.1 % (ref 11.5–14.5)
WBC: 5.2 x10 3/mm (ref 3.8–10.6)

## 2013-12-10 LAB — MAGNESIUM: Magnesium: 1.8 mg/dL

## 2013-12-12 LAB — HEPATIC FUNCTION PANEL
ALK PHOS: 76 U/L (ref 25–125)
ALT: 17 U/L (ref 10–40)
AST: 24 U/L (ref 14–40)
BILIRUBIN, TOTAL: 0.4 mg/dL

## 2013-12-12 LAB — CBC AND DIFFERENTIAL
HEMATOCRIT: 34 % — AB (ref 41–53)
HEMOGLOBIN: 11.2 g/dL — AB (ref 13.5–17.5)
Neutrophils Absolute: 3 /uL
PLATELETS: 293 10*3/uL (ref 150–399)
WBC: 5.6 10^3/mL

## 2013-12-12 LAB — TSH: TSH: 2.02 u[IU]/mL (ref 0.41–5.90)

## 2013-12-12 LAB — LIPID PANEL
Cholesterol: 114 mg/dL (ref 0–200)
HDL: 37 mg/dL (ref 35–70)
LDL Cholesterol: 54 mg/dL
LDL/HDL RATIO: 1.5
TRIGLYCERIDES: 116 mg/dL (ref 40–160)

## 2013-12-17 ENCOUNTER — Encounter: Payer: Self-pay | Admitting: General Surgery

## 2013-12-17 LAB — URINALYSIS, COMPLETE
BILIRUBIN, UR: NEGATIVE
BLOOD: NEGATIVE
Glucose,UR: NEGATIVE mg/dL (ref 0–75)
KETONE: NEGATIVE
Nitrite: NEGATIVE
Ph: 5 (ref 4.5–8.0)
Protein: NEGATIVE
RBC,UR: 2 /HPF (ref 0–5)
Specific Gravity: 1.021 (ref 1.003–1.030)
Squamous Epithelial: <1
WBC UR: 3 /HPF (ref 0–5)

## 2013-12-20 ENCOUNTER — Ambulatory Visit: Payer: Self-pay | Admitting: Vascular Surgery

## 2013-12-25 ENCOUNTER — Encounter: Payer: Self-pay | Admitting: General Surgery

## 2013-12-25 ENCOUNTER — Ambulatory Visit (INDEPENDENT_AMBULATORY_CARE_PROVIDER_SITE_OTHER): Payer: Medicare HMO | Admitting: General Surgery

## 2013-12-25 VITALS — BP 130/88 | HR 76 | Resp 12 | Ht 67.0 in | Wt 146.0 lb

## 2013-12-25 DIAGNOSIS — Z85819 Personal history of malignant neoplasm of unspecified site of lip, oral cavity, and pharynx: Secondary | ICD-10-CM

## 2013-12-25 DIAGNOSIS — R599 Enlarged lymph nodes, unspecified: Secondary | ICD-10-CM

## 2013-12-25 DIAGNOSIS — Z85818 Personal history of malignant neoplasm of other sites of lip, oral cavity, and pharynx: Secondary | ICD-10-CM

## 2013-12-25 DIAGNOSIS — R590 Localized enlarged lymph nodes: Secondary | ICD-10-CM

## 2013-12-25 NOTE — Patient Instructions (Signed)
Patient's surgery has been scheduled for 12-30-13 at [facility].

## 2013-12-25 NOTE — Progress Notes (Signed)
Patient ID: Geoffrey Gregory, male   DOB: 07-Jul-1932, 80 y.o.   MRN: [id number]  Chief Complaint  Patient presents with  . Other    lymphnode    HPI Geoffrey Gregory is a 80 y.o. male.  Here today for evaluation of lymph node. 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 He says there is a lymph node in left axilla that [name] found with the PET scan. Denies any pain. The biopsy completed 11-28-13 showed atypical squamous cells. It is uncertain as to where the primary may have been.   HPI  Past Medical History  Diagnosis Date  . Hyperlipidemia   . 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 Hypertension   . Diabetes mellitus without complication   . CAD (coronary artery disease)   . Vitamin D deficiency   . AAA (abdominal aortic aneurysm)     Past Surgical History  Procedure Laterality Date  . Portacath placement  Fe 2015    [name]  . Axillary lymph node biopsy Left 11-28-13    atyical squamous cells  . Carotid stent  1999  . Hernia repair Bilateral 10-06-00    inguinal/ Dr. [name]    History reviewed. No pertinent family history.  Social History History  Substance Use Topics  . Smoking status: Former Smoker -- 1.00 packs/day for 15 years    Types: Cigarettes    Quit date: 04/11/1997  . Smokeless tobacco: Never Used  . Alcohol Use: No    No Known Allergies  Current Outpatient Prescriptions  Medication Sig Dispense Refill  . acetaminophen (TYLENOL) 500 MG tablet Take 500 mg by mouth every 6 (six) hours as needed.      . Cholecalciferol (VITAMIN D3) 1000 UNITS CAPS Take 1 capsule by mouth daily.      Geoffrey Gregory lovastatin (MEVACOR) 40 MG tablet Take 40 mg by mouth at bedtime.       . metoprolol (LOPRESSOR) 50 MG tablet Take 50 mg by mouth 2 (two) times daily.       Geoffrey Gregory omeprazole (PRILOSEC) 40 MG capsule Take 40 mg by mouth daily.       . prochlorperazine (COMPAZINE) 10 MG tablet Take 10 mg by mouth every 6 (six) hours as needed for nausea or vomiting.       No  current facility-administered medications for this visit.    Review of Systems Review of Systems  Constitutional: Positive for appetite change.  Respiratory: Negative.   Cardiovascular: Negative.     Blood pressure 130/88, pulse 76, resp. rate 12, height 5\' 7"  (1.702 m), weight 146 lb (66.225 kg).  Physical Exam Physical Exam  Constitutional: He is oriented to person, place, and time. He appears well-developed and well-nourished.  Eyes: Conjunctivae are normal. No scleral icterus.  Neck: Neck supple.  Cardiovascular: Normal rate, regular rhythm and normal heart sounds.   Pulmonary/Chest: Effort normal and breath sounds normal.  Lymphadenopathy:    He has no cervical adenopathy.    He has axillary adenopathy.  2 cm firm mobile lymph node left axilla  Neurological: He is alert and oriented to person, place, and time.  Skin: Skin is warm and dry.    Data Reviewed Office notes, PET scan and pathology reviewed.  Assessment    Lymphadenopathy left axilla. This case ws discussed in Tumor Case coneference and consensus was to excise the left axillary node for more detailed pathologic analysis including tumor markers.    Plan  Plan for excision left axillary node. Procedure explained to pt.     Patient's surgery has been scheduled for 12-30-13 at [facility].   Gloriana Piltz G 12/25/2013, 10:06 AM

## 2013-12-26 ENCOUNTER — Ambulatory Visit: Payer: Self-pay | Admitting: General Surgery

## 2013-12-30 ENCOUNTER — Ambulatory Visit: Payer: Self-pay | Admitting: General Surgery

## 2013-12-30 DIAGNOSIS — C099 Malignant neoplasm of tonsil, unspecified: Secondary | ICD-10-CM

## 2013-12-30 HISTORY — PX: LYMPH NODE DISSECTION: SHX5087

## 2014-01-01 ENCOUNTER — Encounter: Payer: Self-pay | Admitting: General Surgery

## 2014-01-02 LAB — STOOL CULTURE

## 2014-01-02 LAB — PATHOLOGY REPORT

## 2014-01-03 ENCOUNTER — Encounter: Payer: Self-pay | Admitting: General Surgery

## 2014-01-07 ENCOUNTER — Encounter: Payer: Self-pay | Admitting: General Surgery

## 2014-01-07 ENCOUNTER — Ambulatory Visit (INDEPENDENT_AMBULATORY_CARE_PROVIDER_SITE_OTHER): Payer: Self-pay | Admitting: General Surgery

## 2014-01-07 VITALS — BP 110/76 | HR 74 | Resp 12 | Ht 67.0 in | Wt 145.0 lb

## 2014-01-07 DIAGNOSIS — R590 Localized enlarged lymph nodes: Secondary | ICD-10-CM

## 2014-01-07 DIAGNOSIS — R599 Enlarged lymph nodes, unspecified: Secondary | ICD-10-CM

## 2014-01-07 NOTE — Progress Notes (Signed)
This is a 80 year old male here today for his post op excision axilla node on 12-30-13. States he is doing well.  Small seroma left axilla. Pathology showed benign lymph node with no metastic disease. Pt advised.  Follow up in one month.

## 2014-01-07 NOTE — Patient Instructions (Addendum)
Follow up in one month Heating pad to axilla as needed

## 2014-01-08 ENCOUNTER — Encounter: Payer: Self-pay | Admitting: General Surgery

## 2014-01-09 ENCOUNTER — Ambulatory Visit: Payer: Self-pay | Admitting: Hematology and Oncology

## 2014-01-21 DIAGNOSIS — K219 Gastro-esophageal reflux disease without esophagitis: Secondary | ICD-10-CM | POA: Insufficient documentation

## 2014-01-21 DIAGNOSIS — I6529 Occlusion and stenosis of unspecified carotid artery: Secondary | ICD-10-CM | POA: Insufficient documentation

## 2014-02-05 ENCOUNTER — Encounter: Payer: Medicare HMO | Admitting: General Surgery

## 2014-02-05 NOTE — Progress Notes (Signed)
This encounter was created in error - please disregard.

## 2014-02-11 ENCOUNTER — Ambulatory Visit (INDEPENDENT_AMBULATORY_CARE_PROVIDER_SITE_OTHER): Payer: Self-pay | Admitting: General Surgery

## 2014-02-11 ENCOUNTER — Encounter: Payer: Self-pay | Admitting: General Surgery

## 2014-02-11 VITALS — BP 118/82 | HR 78 | Resp 14 | Ht 67.0 in | Wt 144.0 lb

## 2014-02-11 DIAGNOSIS — R590 Localized enlarged lymph nodes: Secondary | ICD-10-CM

## 2014-02-11 DIAGNOSIS — Z85819 Personal history of malignant neoplasm of unspecified site of lip, oral cavity, and pharynx: Secondary | ICD-10-CM

## 2014-02-11 DIAGNOSIS — Z85818 Personal history of malignant neoplasm of other sites of lip, oral cavity, and pharynx: Secondary | ICD-10-CM

## 2014-02-11 NOTE — Progress Notes (Signed)
This is a 80 year old male here today for his post op excision axilla node on 12-30-13. States he is doing well.  Pathology showed benign lymph node with no metastatic disease. Next appointment with Dr [name] January 2016 with CT scan. Seroma left axilla resolved. Incision  well healed. No cervical or axillary adenopathy.   Follow up as needed.

## 2014-02-11 NOTE — Patient Instructions (Signed)
The patient is aware to call back for any questions or concerns.  

## 2014-02-27 ENCOUNTER — Ambulatory Visit: Payer: Self-pay | Admitting: Vascular Surgery

## 2014-02-27 LAB — CBC
HCT: 36.3 % — AB (ref 40.0–52.0)
HGB: 11.7 g/dL — ABNORMAL LOW (ref 13.0–18.0)
MCH: 28.8 pg (ref 26.0–34.0)
MCHC: 32.2 g/dL (ref 32.0–36.0)
MCV: 89 fL (ref 80–100)
PLATELETS: 196 10*3/uL (ref 150–440)
RBC: 4.07 10*6/uL — ABNORMAL LOW (ref 4.40–5.90)
RDW: 16.2 % — ABNORMAL HIGH (ref 11.5–14.5)
WBC: 5.8 10*3/uL (ref 3.8–10.6)

## 2014-02-27 LAB — BASIC METABOLIC PANEL
ANION GAP: 5 — AB (ref 7–16)
BUN: 21 mg/dL — ABNORMAL HIGH (ref 7–18)
CHLORIDE: 102 mmol/L (ref 98–107)
CO2: 27 mmol/L (ref 21–32)
CREATININE: 1.37 mg/dL — AB (ref 0.60–1.30)
Calcium, Total: 9.1 mg/dL (ref 8.5–10.1)
EGFR (African American): >60
GFR CALC NON AF AMER: 53 — AB
Glucose: 88 mg/dL (ref 65–99)
OSMOLALITY: 271 (ref 275–301)
Potassium: 4.9 mmol/L (ref 3.5–5.1)
Sodium: 134 mmol/L — ABNORMAL LOW (ref 136–145)

## 2014-03-12 ENCOUNTER — Inpatient Hospital Stay: Payer: Self-pay | Admitting: Vascular Surgery

## 2014-03-13 LAB — COMPREHENSIVE METABOLIC PANEL
Albumin: 2.9 g/dL — ABNORMAL LOW (ref 3.4–5.0)
Alkaline Phosphatase: 55 U/L
Anion Gap: 8 (ref 7–16)
BUN: 16 mg/dL (ref 7–18)
Bilirubin,Total: 0.8 mg/dL (ref 0.2–1.0)
Calcium, Total: 7.7 mg/dL — ABNORMAL LOW (ref 8.5–10.1)
Chloride: 103 mmol/L (ref 98–107)
Co2: 25 mmol/L (ref 21–32)
Creatinine: 1.24 mg/dL (ref 0.60–1.30)
EGFR (Non-African Amer.): 60 — ABNORMAL LOW
Glucose: 104 mg/dL — ABNORMAL HIGH (ref 65–99)
OSMOLALITY: 273 (ref 275–301)
Potassium: 4.1 mmol/L (ref 3.5–5.1)
SGOT(AST): 27 U/L (ref 15–37)
SGPT (ALT): 22 U/L
Sodium: 136 mmol/L (ref 136–145)
Total Protein: 5.8 g/dL — ABNORMAL LOW (ref 6.4–8.2)

## 2014-03-13 LAB — CBC WITH DIFFERENTIAL/PLATELET
BASOS ABS: 0 10*3/uL (ref 0.0–0.1)
Basophil %: 0.5 %
Eosinophil #: 0.2 10*3/uL (ref 0.0–0.7)
Eosinophil %: 3.5 %
HCT: 30.4 % — ABNORMAL LOW (ref 40.0–52.0)
HGB: 9.8 g/dL — AB (ref 13.0–18.0)
LYMPHS ABS: 0.6 10*3/uL — AB (ref 1.0–3.6)
LYMPHS PCT: 8.3 %
MCH: 28.8 pg (ref 26.0–34.0)
MCHC: 32.1 g/dL (ref 32.0–36.0)
MCV: 90 fL (ref 80–100)
MONO ABS: 0.7 x10 3/mm (ref 0.2–1.0)
Monocyte %: 10 %
NEUTROS ABS: 5.3 10*3/uL (ref 1.4–6.5)
Neutrophil %: 77.7 %
Platelet: 121 10*3/uL — ABNORMAL LOW (ref 150–440)
RBC: 3.39 10*6/uL — ABNORMAL LOW (ref 4.40–5.90)
RDW: 16.5 % — ABNORMAL HIGH (ref 11.5–14.5)
WBC: 6.9 10*3/uL (ref 3.8–10.6)

## 2014-03-13 LAB — MAGNESIUM: MAGNESIUM: 1.4 mg/dL — AB

## 2014-03-13 LAB — PHOSPHORUS: Phosphorus: 2.5 mg/dL (ref 2.5–4.9)

## 2014-03-13 LAB — APTT: Activated PTT: 27.7 secs (ref 23.6–35.9)

## 2014-03-13 LAB — PROTIME-INR
INR: 1.1
Prothrombin Time: 14.2 secs (ref 11.5–14.7)

## 2014-04-16 LAB — HEMOGLOBIN A1C: HEMOGLOBIN A1C: 5.5 % (ref 4.0–6.0)

## 2014-05-01 LAB — BASIC METABOLIC PANEL
BUN: 27 mg/dL — AB (ref 4–21)
Creatinine: 1.5 mg/dL — AB (ref 0.6–1.3)
Glucose: 98 mg/dL
Potassium: 4.7 mmol/L (ref 3.4–5.3)
Sodium: 131 mmol/L — AB (ref 137–147)

## 2014-05-06 ENCOUNTER — Ambulatory Visit: Payer: Self-pay | Admitting: Oncology

## 2014-05-07 ENCOUNTER — Ambulatory Visit: Payer: Self-pay | Admitting: Oncology

## 2014-05-07 LAB — CBC CANCER CENTER
Basophil #: 0 x10 3/mm (ref 0.0–0.1)
Basophil %: 0.9 %
EOS ABS: 0.5 x10 3/mm (ref 0.0–0.7)
Eosinophil %: 8.5 %
HCT: 40.4 % (ref 40.0–52.0)
HGB: 13 g/dL (ref 13.0–18.0)
LYMPHS PCT: 17.1 %
Lymphocyte #: 0.9 x10 3/mm — ABNORMAL LOW (ref 1.0–3.6)
MCH: 28.3 pg (ref 26.0–34.0)
MCHC: 32.3 g/dL (ref 32.0–36.0)
MCV: 88 fL (ref 80–100)
MONOS PCT: 8.4 %
Monocyte #: 0.5 x10 3/mm (ref 0.2–1.0)
NEUTROS ABS: 3.6 x10 3/mm (ref 1.4–6.5)
Neutrophil %: 65.1 %
Platelet: 185 x10 3/mm (ref 150–440)
RBC: 4.6 10*6/uL (ref 4.40–5.90)
RDW: 15.4 % — ABNORMAL HIGH (ref 11.5–14.5)
WBC: 5.5 x10 3/mm (ref 3.8–10.6)

## 2014-05-07 LAB — COMPREHENSIVE METABOLIC PANEL
ANION GAP: 10 (ref 7–16)
Albumin: 3.9 g/dL (ref 3.4–5.0)
Alkaline Phosphatase: 80 U/L (ref 46–116)
BUN: 23 mg/dL — AB (ref 7–18)
Bilirubin,Total: 0.6 mg/dL (ref 0.2–1.0)
CHLORIDE: 96 mmol/L — AB (ref 98–107)
Calcium, Total: 9 mg/dL (ref 8.5–10.1)
Co2: 29 mmol/L (ref 21–32)
Creatinine: 1.6 mg/dL — ABNORMAL HIGH (ref 0.60–1.30)
EGFR (Non-African Amer.): 45 — ABNORMAL LOW
GFR CALC AF AMER: 54 — AB
Glucose: 163 mg/dL — ABNORMAL HIGH (ref 65–99)
Osmolality: 277 (ref 275–301)
POTASSIUM: 4.3 mmol/L (ref 3.5–5.1)
SGOT(AST): 25 U/L (ref 15–37)
SGPT (ALT): 33 U/L (ref 14–63)
SODIUM: 135 mmol/L — AB (ref 136–145)
Total Protein: 7.6 g/dL (ref 6.4–8.2)

## 2014-05-07 LAB — LACTATE DEHYDROGENASE: LDH: 152 U/L (ref 85–241)

## 2014-05-12 ENCOUNTER — Ambulatory Visit: Payer: Self-pay | Admitting: Oncology

## 2014-08-02 NOTE — Op Note (Signed)
PATIENT NAME:  Geoffrey Gregory, Geoffrey Gregory MR#:  [id number] DATE OF BIRTH:  08-Dec-1932  DATE OF PROCEDURE:  05/20/2013  PREOPERATIVE DIAGNOSES: 1.  Tonsillar cancer. 2.  Hypertension. 3.  Diabetes. 4.  Coronary disease.  POSTOPERATIVE DIAGNOSES:  1.  Tonsillar cancer. 2.  Hypertension. 3.  Diabetes. 4.  Coronary disease.  PROCEDURES:  1.  Ultrasound guidance for vascular access, right internal jugular vein.  2.  Fluoroscopic guidance for placement of catheter.  3.  Placement of CT compatible Port-A-Cath, right internal jugular vein.   SURGEON:  Leotis Pain, MD.   ANESTHESIA:  Local with moderate conscious sedation.   FLUOROSCOPY TIME:  Less than 1 minute.   CONTRAST:  Zero.   ESTIMATED BLOOD LOSS: Minimal.   INDICATION FOR PROCEDURE:  A 81 year old male with tonsillar cancer. He needs a Port-A-Cath to start chemotherapy later this week. Risks and benefits were discussed. Informed consent was obtained.   DESCRIPTION OF THE PROCEDURE:  The patient was brought to the vascular and interventional radiology suite. The right neck and chest were sterilely prepped and draped, and a sterile surgical field was created. Ultrasound was used to help visualize a patent right internal jugular vein. This was then accessed under direct ultrasound guidance without difficulty with a Seldinger needle and a permanent image was recorded. A J-wire was placed. After skin nick and dilatation, the peel-away sheath was then placed over the wire. I then anesthetized an area under the clavicle approximately 2 fingerbreadths. A transverse incision was created and an inferior pocket was created with electrocautery and blunt dissection. The port was then brought onto the field, placed into the pocket and secured to the chest wall with 2 Prolene sutures. The catheter was connected to the port and tunneled from the subclavicular incision to the access site. Fluoroscopic guidance was used to cut the catheter to an appropriate length.  The catheter was then placed through the peel-away sheath and the peel-away sheath was removed. The catheter tip was parked in excellent location in the cavoatrial junction. The pocket was then irrigated with antibiotic-impregnated saline and the wound was closed with a running 3-0 Vicryl and a 4-0 Monocryl. The access incision was closed with a single 4-0 Monocryl. The Huber needle was used to withdraw blood and flush the port with heparinized saline. Dermabond was then placed as a dressing. The patient tolerated the procedure well and was taken to the recovery room in stable condition.   ____________________________ Algernon Huxley, MD jsd:aw D: 05/20/2013 10:08:43 ET T: 05/20/2013 12:28:39 ET JOB#: [id number]  cc: Algernon Huxley, MD, <Dictator> Algernon Huxley MD ELECTRONICALLY SIGNED 05/20/2013 13:51

## 2014-08-02 NOTE — Op Note (Signed)
PATIENT NAME:  Geoffrey Gregory, Geoffrey Gregory MR#:  [id number] DATE OF BIRTH:  [birth year]  DATE OF PROCEDURE:  03/12/2014  PREOPERATIVE DIAGNOSES:  1.  Abdominal aortic aneurysm.  2.  Atherosclerotic occlusive disease bilateral lower extremities.  3.  Hypertension.  4.  Obesity.   POSTOPERATIVE DIAGNOSES:  1.  Abdominal aortic aneurysm.  2.  Atherosclerotic occlusive disease bilateral lower extremities.  3.  Hypertension.  4.  Obesity.   PROCEDURES PERFORMED:  1.  Introduction catheter into aorta percutaneous right common femoral approach.  2.  Introduction catheter into aorta percutaneous left common femoral approach.  3.  Repair of abdominal aortic aneurysm with Gore C3 Excluder stent graft.  4.  Extension of right iliac limb with a 12 x 10 extender cuff.   SURGEONS:  Dr. Lucky Cowboy, Dr. Delana Meyer co-surgeon.   ANESTHESIA: General by endotracheal intubation.   FLUIDS: Per anesthesia record.   ESTIMATED BLOOD LOSS: Minimal.   SPECIMEN: None.   FLUOROSCOPY TIME: 30.1 minutes.   CONTRAST USED: Isovue 100 mL.   INDICATIONS: Geoffrey Gregory is a 80 year old gentleman who has been followed in the office and is now found to have an abdominal aortic aneurysm greater than 5 cm in diameter. CT angiography demonstrated anatomy acceptable for endograft placement and he is therefore undergoing endovascular repair. Risks and benefits were reviewed. All questions answered. The patient has agreed to proceed.   DESCRIPTION OF PROCEDURE: The patient is taken to the special procedure suite, placed in the supine position. After adequate general anesthesia is induced and appropriate invasive monitors are placed he is positioned supine and prepped from just below the chest to above the knees. He is then draped in a sterile fashion and appropriate timeout is called.   Ultrasound is placed in a sterile sleeve and ultrasound is utilized to assist in accessing the common femoral arteries with Dr. Lucky Cowboy working on the right side,  myself on the left. The right common femoral artery is identified. It is echolucent and pulsatile indicating patency. Image is recorded for the permanent record and Seldinger needle is then inserted into the anterior wall and a J-wire advanced. In a similar fashion the left femoral is imaged, it is patent, image is recorded, and a micropuncture needle is inserted followed by microwire, micro sheath and a J-wire.  6 French sheaths are placed.   Again working simultaneously and performing this in a similar fashion Perclose devices are utilized in a pre-close fashion, advancing one and deploying the needles at the 11 o'clock position and a second at the 1 o'clock position. Both sutures are then tagged with a curved and a straight hemostat. Sheaths are upsized to 8 French sheaths and 5000 units of heparin are given.   Pigtail catheter is then advanced up the right and abdominal aortogram is obtained, renal arteries are marked and with a marker pigtail final length measurements are made. A 26 x 12 x 18 Gore C3 main body is then prepped on the back table. The sheath is upsized to a 16 French sheath on the right over an Amplatz Super Stiff wire, which was advanced through the pigtail catheter. KMP catheter is then advanced from the left over the J-wire and positioned at the approximate level of the renals and the wire is removed. With the larger sheath in place the main body is then advanced over the wire, positioned just below the renals. Aortogram and a magnified view is obtained through the [place] catheter to more closely localize the left renal artery which  is the lower renal and the main body is then positioned and opened through the contralateral gate. KMP catheter is pulled back into the aorta, and attempts at advancing the wire through the contralateral gate are unsuccessful initially. Several different catheters and wires are utilized, ultimately advancing the 12 French sheath and utilizing a KMP catheter and  multiple different views. The gate is engaged. Glidewire is advanced following the KMP catheter. KMP catheter is then exchanged for a pigtail catheter which is twirled in the main body verifying intraluminal placement. Amplatz Super Stiff wire is then advanced through the pigtail catheter. Markers are adjusted to match the gate marker and an RAO projection of the pelvis is obtained. Length measurements are made and a 14 x 14 contralateral limb is prepped on the back table and then advanced through the 12 [place] sheath.  It is then deployed without difficulty. At this point the remaining portion of the ipsilateral limb is deployed. Small injection of contrast verifies position and distal positioning of the limb which is within the common iliac aneurysm and therefore iliac extender which is a 12 x 10 is advanced through the right-sided sheath and deployed without difficulty extending approximately 3-4 cm into the external iliac on the right.   Coda balloon is then advanced up the left sheath and angioplasty is performed throughout the main body in the left limb.  The Pry[place] Ochoa is then advanced up the right and angioplasty of the main body in the right limb is then performed. The portion of the stent within the external on the right and the common on the left remain undersized and therefore a 10 x 6 balloon is advanced up the left and an 8 x 6 is advanced up the right, balloon angioplasty is performed to 10 atmospheres bilaterally. This fully expands both limbs with significant improvement.   The pigtail catheter is then advanced up the right and final imaging is performed, a delayed type 2 endoleak from a pair of lumbars is noted, but this is quite small and likely will thrombose once the heparin has worn off. No other graft abnormalities are noted. It should be noted that the internal iliac on the right was occluded based on the initial films and therefore no embolization was needed.   The right sheath is then  removed and the pre-close device is deployed successfully, the knots are secured and pressure is held. The left sheath is removed and again the sutures are secured and light pressure is held. Both groin incisions are closed with a single interrupted 4-0 Monocryl and then Dermabond. Safeguards are applied. The patient tolerated the procedure well. There were no immediate complications. Sponge and needle counts were correct and he was taken to the recovery area in excellent condition.    ____________________________ Katha Cabal, MD ggs:bu D: 03/12/2014 19:26:48 ET T: 03/12/2014 20:17:23 ET JOB#: [id number]  cc: Katha Cabal, MD, <Dictator> Katha Cabal MD ELECTRONICALLY SIGNED 03/15/2014 16:40

## 2014-08-02 NOTE — Op Note (Signed)
PATIENT NAME:  PARKS, CZAJKOWSKI MR#:  [id number] DATE OF BIRTH:  Apr 12, 1932  DATE OF PROCEDURE:  03/12/2014  PREOPERATIVE DIAGNOSES:  1.  Abdominal aortic aneurysm.  2.  Tonsillar cancer.  3.  Coronary disease.  4.  Hyperlipidemia.  5.  Diabetes.  POSTOPERATIVE DIAGNOSES: 1.  Abdominal aortic aneurysm.  2.  Tonsillar cancer.  3.  Coronary disease.  4.  Hyperlipidemia.  5.  Diabetes.    PROCEDURES:  1.  Ultrasound guidance for vascular access of bilateral femoral arteries; right by Dr. Leotis Pain, left by Dr. Hortencia Pilar.  2.  Catheter placement of the aorta from bilateral femoral approaches; from the right by Dr. Leotis Pain and from the left by Dr. Hortencia Pilar.  3.  Aortogram and iliofemoral arteriogram.  4.  Placement of a Gore Excluder endoprosthesis, main body right, 26 mm diameter main body, 18 cm length, with a 14 mm diameter x 14 cm length contralateral limb; co-surgeons. 5.  Placement of a right iliac artery extension cuff, 12 mm diameter x 12 cm length; co-surgeons.  6.  ProGlide closure device, bilateral femoral arteries; right by Dr. Leotis Pain and left by Dr. Hortencia Pilar.   CO-SURGEONS: Algernon Huxley, MD and Katha Cabal, MD  ANESTHESIA: General.   BLOOD LOSS: 50 mL   FLUOROSCOPY TIME: 30 minutes   CONTRAST USED: 100 mL   INDICATION FOR PROCEDURE: A 81 year old gentleman with a nearly 6 cm infrarenal abdominal aortic aneurysm with appropriate anatomy for an endovascular repair. He is brought in for repair of this. Risks and benefits were discussed. Informed consent was obtained.   DESCRIPTION OF PROCEDURE: The patient was brought to the vascular suite with the help our anesthesia colleagues providing general anesthetic. His abdomen and groins were sterilely prepped and draped, and a sterile surgical field was created. After an appropriate surgical timeout and antibiotics, we used ultrasound to access the femoral arteries on each side. I did the right and Dr.  Delana Meyer did the left, and 6 French sheaths were placed. We then placed 2 ProGlide devices in a pre close fashion. Again, I did the right and Dr. Delana Meyer did the left, and 8 French sheaths were placed. Next 5000 units of intravenous heparin were given for systemic anticoagulation.   Pigtail catheter was placed up the right, and aortogram was performed. This demonstrated the expected infrarenal abdominal aortic aneurysm. The right hypogastric artery was chronically occluded, which was also expected from the CT scan, with planned extension of the stent graft down into the external iliac arteries as the aneurysm went down throughout the entirety of the common iliac artery.   I put a 32 French sheath up on the right and a 12 French sheath up on the left. A Kumpe catheter was placed through the left femoral sheath, and an aortogram was performed in a magnified fashion. The stent graft then deployed, just to the base of the left renal artery, which was lowest in a flipped configuration, using a 26 mm diameter proximal, 18 cm in length main body. Dr. Delana Meyer cannulated the contralateral gate with a mild amount of difficulty; ultimately with a Kumpe catheter and stiff angled Glidewire. We confirmed successful cannulation by putting a pigtail catheter in the main body and twirling this. Imaging was performed through this to ensure that the stent graft remaining in good location. A 14 mm diameter x 14 cm in length contralateral limb was placed down to just above the left hypogastric artery, and the main  body deployment was completed in the right iliac, which extended down to the distal common iliac artery, where there was still aneurysmal degeneration. We had to extend down with a 12 mm diameter x 12 cm length limb, several centimeters into the external iliac artery to get a good seal. All junction points and seal zones were ironed out with the compliant balloon. Due to some residual constraint in the iliac vessels, an 8  mm diameter non-compliant balloon was used on the right and a 10 mm diameter non compliant balloon was used on the left within the stent, with a good angiographic completion result.   The completion angiogram showed what appeared to be a small type II endoleak, but no type I or III endoleaks, with brisk flow through the stent graft. The left hypogastric artery remained patent, and both renal arteries remained patent.   At this point, we elected to terminate the procedure. The ProGlide devices were used to close the arteriotomy on each side. I did the right and Dr. Delana Meyer did the left. Next, 4-0 Monocryl was used to close the skin incision, and Dermabond and pressure dressings were placed.   The patient tolerated the procedure well and was taken to the recovery room in stable condition.    ____________________________ Algernon Huxley, MD jsd:MT D: 03/12/2014 15:08:01 ET T: 03/12/2014 17:02:23 ET JOB#: [id number]  cc: Algernon Huxley, MD, <Dictator> Richard L. Rosanna Randy, MD Algernon Huxley MD ELECTRONICALLY SIGNED 04/10/2014 14:12

## 2014-08-02 NOTE — Discharge Summary (Signed)
Dates of Admission and Diagnosis:  Date of Admission 19-Nov-2013   Date of Discharge 21-Nov-2013   Admitting Diagnosis acute on chronic renal failure   Final Diagnosis acute on chronic renal failure   Discharge Diagnosis 1 severe diarrhea  secondary to salmonellosis   2 dehydration   3 carcinoma of   tonsils  status post chemoradiation therapywith abnormalPET scan    Chief Complaint/History of Present Illness Patient is here for further evaluation regarding tonsilar cancer. He completed chemoradiation on 08/02/13. He recently had a PET scan with some areas of concern noted in the left axillary area. Lymph nodes are very small but light up slightly on PET scan. He follows up today for further discussion with Dr. Doylene Canning regarding biopsy versus rescanning for follow up. He reports seeing his ENT on Friday, Dr. Gertie Baron. He also states that he developed severe diarrhea and vomiting on Sunday. He has taken his compazine at home for nausea with some relief but continues with loose liquid stools, last episode this morning. He denies any fever, chills, cough, shortness of breath, or chest pain. Reports no other family memebrs have been ill. Has been very weak since Sunday.   Allergies:  No Known Allergies:   Hepatic:  13-Aug-15 05:04   Bilirubin, Total 0.3  Alkaline Phosphatase 49 (46-116 NOTE: New Reference Range 10/29/13)  SGPT (ALT) 19 (14-63 NOTE: New Reference Range 10/29/13)  SGOT (AST) 22  Total Protein, Serum  6.0  Albumin, Serum  2.6  Routine Chem:  12-Aug-15 04:22   Glucose, Serum  109  BUN  57  Creatinine (comp)  4.13  Sodium, Serum  130  Potassium, Serum  3.4  Chloride, Serum 106  CO2, Serum  13  Calcium (Total), Serum  7.6  Osmolality (calc) 277  eGFR (African American)  15  eGFR (Non-African American)  13 (eGFR values <21mL/min/1.73 m2 may be an indication of chronic kidney disease (CKD). Calculated eGFR is useful in patients with stable renal function. The  eGFR calculation will not be reliable in acutely ill patients when serum creatinine is changing rapidly. It is not useful in  patients on dialysis. The eGFR calculation may not be applicable to patients at the low and high extremes of body sizes, pregnant women, and vegetarians.)  Anion Gap 11  13-Aug-15 05:04   Result Comment labs  - This specimen was collected through an   - indwelling catheter or arterial line.  - A minimum of of blood was wasted prior    - to collecting the sample.  Interpret  - results with caution.  Result(s) reported on 21 Nov 2013 at 08:44AM.  Glucose, Serum 96  BUN  46  Creatinine (comp)  2.69  Sodium, Serum  132  Potassium, Serum  3.1  Chloride, Serum  110  CO2, Serum  17  Calcium (Total), Serum  7.5  Osmolality (calc) 276  eGFR (African American)  25  eGFR (Non-African American)  22 (eGFR values <63mL/min/1.73 m2 may be an indication of chronic kidney disease (CKD). Calculated eGFR is useful in patients with stable renal function. The eGFR calculation will not be reliable in acutely ill patients when serum creatinine is changing rapidly. It is not useful in  patients on dialysis. The eGFR calculation may not be applicable to patients at the low and high extremes of body sizes, pregnant women, and vegetarians.)  Anion Gap  5  Routine Hem:  12-Aug-15 04:22   WBC (CBC) 5.7  RBC (CBC)  3.80  Hemoglobin (CBC)  11.6  Hematocrit (CBC)  36.0  Platelet Count (CBC) 169  MCV 95  MCH 30.5  MCHC 32.2  RDW 13.3  Neutrophil % 88.5  Lymphocyte % 2.8  Monocyte % 8.5  Eosinophil % 0.1  Basophil % 0.1  Neutrophil # 5.1  Lymphocyte #  0.2  Monocyte # 0.5  Eosinophil # 0.0  Basophil # 0.0 (Result(s) reported on 20 Nov 2013 at [facility].)  13-Aug-15 05:04   WBC (CBC) 3.9  RBC (CBC)  3.41  Hemoglobin (CBC)  10.6  Hematocrit (CBC)  31.9  Platelet Count (CBC)  146  MCV 93  MCH 31.0  MCHC 33.2  RDW 12.9  Neutrophil % 75.8  Lymphocyte % 5.9   Monocyte % 17.0  Eosinophil % 1.0  Basophil % 0.3  Neutrophil # 3.0  Lymphocyte #  0.2  Monocyte # 0.7  Eosinophil # 0.0  Basophil # 0.0   PERTINENT RADIOLOGY STUDIES: [place]:    11-Aug-15 14:12, US Kidney Bilateral  US Kidney Bilateral   REASON FOR EXAM:    acute renal failure, dehydration  COMMENTS:       PROCEDURE: [place]  - US KIDNEY  - Nov 19 2013  2:12PM     CLINICAL DATA:  Acute renal failure. Dehydration. Head and neck  cancer.    EXAM:  RENAL/URINARY TRACT ULTRASOUND COMPLETE    COMPARISON:  Multiple exams, including 11/11/2013    FINDINGS:  Right Kidney:  Length: 9.3 cm. Abnormally increased echogenicity compared to the  liver. Mid kidney medial cyst 0.7 cm.    Left Kidney:    Length: 9.2 cm. Mildly hyperechoic, similar to the contralateral  side. Left kidney lower pole cyst measures 3.0 x 2.1 x 1.7 cm.    Bladder:    Appears normal for degree of bladder distention.     IMPRESSION:  1. Small echogenic kidneys, favoring chronic medical renal disease.  Single bilateral renal cysts.  Electronically Signed    By: Sherryl Barters M.D.    On: 11/19/2013 14:17         Verified By: Carron Curie, M.D.,  LabUnknown:  PACS Image    Pertinent Past History:  Pertinent Past History Significant History/PMH: ??  nocturia:  ??  Hypertension:  ??  ASCVD:  ??  CAD:  ??  Hyperlipidemia:  ??  Vitamin D Deficiency:  ??  Diabetes Mellitus, Type II (NIDD):  ??  Hypotension:  ??  Port Placement:   Hospital Course:  Hospital Course During hospital stay patient was started on IV fluid.  Stool was cultured was negative for C. difficile but was positive for Salmonella.  Patient also had evaluation by nephrologist.  Ultrasound of both kidneys revealed chronic kidney disease.  Gradually serum creatinine improved.  Patient was started on Cipro and was discharged on Cipro.  Patient felt much better.  Diarrhea improved.  Patient was advised to get followup as outpatient    Condition on Discharge Fair   Code Status:  Code Status Full Code   DISCHARGE INSTRUCTIONS HOME MEDS:  Medication Reconciliation: Patient's Home Medications at Discharge:     Medication Instructions  metoprolol tartrate 50 mg oral tablet  1 tab(s) orally 2 times a day   lovastatin 40 mg oral tablet  1 tab(s) orally once a day   vitamin d3 1000 intl units oral capsule  1 cap(s) orally once a day   compazine 10 mg oral tablet  1 tab(s) orally 3 times a day, As Needed -  for Nausea, Vomiting   omeprazole 40 mg oral delayed release capsule  1 cap(s) orally once a day   acetaminophen 500 mg oral tablet  2 tab(s) orally 1 to 3 times a day, As Needed - for Pain   ciprofloxacin 500 mg oral tablet  1 tab(s) orally once a day   aspirin 81 mg oral tablet  1 tab(s) orally once a day    PRESCRIPTIONS: ELECTRONICALLY SUBMITTED   Physician's Instructions:  Home Health? No   Treatments None   Home Oxygen? No   Diet Regular   Diet Consistency Regular Consistency   Activity Limitations None   Return to Work Not Applicable   Time frame for Follow Up Appointment 1-2 days   Electronic Signatures: Jobe Gibbon (MD)  (Signed 31-Aug-15 20:41)  Authored: ADMISSION DATE AND DIAGNOSIS, CHIEF COMPLAINT/HPI, Allergies, PERTINENT LABS, PERTINENT RADIOLOGY STUDIES, PERTINENT PAST HISTORY, HOSPITAL COURSE, DISCHARGE INSTRUCTIONS HOME MEDS, PATIENT INSTRUCTIONS   Last Updated: 31-Aug-15 20:41 by Jobe Gibbon (MD)

## 2014-08-02 NOTE — Op Note (Signed)
PATIENT NAME:  Geoffrey Gregory, Geoffrey Gregory MR#:  [id number] DATE OF BIRTH:  1932/09/30  DATE OF PROCEDURE:  05/09/2013  PREOPERATIVE DIAGNOSIS: Left tonsil mass (likely squamous cell carcinoma).  POSTOPERATIVE DIAGNOSIS: Left tonsil mass (likely squamous cell carcinoma). Frozen section analysis consistent with invasive moderately differentiated squamous cell carcinoma.   PROCEDURES:  1.  Microscopic direct laryngoscopy.  2.  Incisional biopsy, left tonsil.   SURGEON: Nadeen Landau, M.D.   DESCRIPTION OF PROCEDURE: The patient was placed in supine position on the operating room table. After general endotracheal anesthesia was induced, the patient was turned slightly from anesthesia and the Dedo laryngoscope was used to examine the larynx, oropharynx and no other lesions other than the tonsillar mass were identified. The tonsillar mass filled the entire tonsillar fossa and extended superiorly and inferiorly, essentially by palpation measured approximately 3 x 3 cm and was palpably discrete from the left neck nodule (approximately 1.5 x 2 cm.) The Dingman mouth retractor was then placed and the superior portion of the mass was sent for frozen section analysis. An additional approximately 1 x 1.5 cm of the very friable tissue was removed. Hemostasis was achieved, but the entire mass was not able to be removed due to extension beyond the tonsillar fossa and there was fairly moderate diffuse bleeding from the incisional biopsy. A tonsil sponge was placed and left in place for approximately 10 minutes while the pathology was read. No additional bleeding was encountered, therefore the area was irrigated, suctioned, the patient was returned to anesthesia, allowed to emerge from anesthesia in the operating room and taken to the recovery room in stable condition. There were no complications. Estimated blood loss 25 mL.   ____________________________ J. Nadeen Landau, MD jmc:aw D: 05/09/2013 11:53:57 ET T: 05/09/2013  12:43:01 ET JOB#: [id number]  cc: Janalee Dane, MD, <Dictator> Nicholos Johns MD ELECTRONICALLY SIGNED 05/12/2013 10:04

## 2014-08-02 NOTE — Consult Note (Signed)
Reason for Visit: This 81 year old Male patient presents to the clinic for initial evaluation of  head and neck cancer .   Referred by Dr. Nadeen Landau.  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  Pathology Report pathology report reviewed   Imaging Report PET/CT scans reviewed   Referral Report clinical notes reviewed   Planned Treatment Regimen concurrent chemotherapy and radiation therapy   HPI   patient is a pleasant 81 year old male who presented with a several month history of left-sided throat pain. So his PMD who noted some swelling in his left cervical chain. He initially was treated empirically with antibiotic therapy then saw Dr. Carlis Abbott underwent fiberoptic laryngoscopy on January 29 showing a left posterior displacement of the tonsil highly suspicious for tonsillar carcinoma. Mass was appreciated at 3 x 3 cm. Went on to have biopsy positive for moderately differentiated squamous cell carcinoma with lymphovascular invasion.PET CT scan was performed showinghypermetabolic 3.3 cm mass in the left tonsillar pillar with hypermetabolic lymph nodes in the left cervical chain down into the superior left supraclavicular fossa.patient has been seen by medical oncology and plan is for concurrent chemoradiation. Patient is doing fairly well. Does have some slight dysphasia. Also complains of swelling in his left neck.  Past Hx:    nocturia:    Hypertension:    ASCVD:    CAD:    Hyperlipidemia:    Vitamin D Deficiency:    Diabetes Mellitus, Type II (NIDD):    Hypotension:    Port Placement:    Carotid Stent Placement:   Past, Family and Social History:  Past Medical History positive   Cardiovascular coronary artery disease; coronary artery stents; hyperlipidemia; hypertension   Endocrine diabetes mellitus   Family History noncontributory    Social History positive   Social History Comments 40-pack-year smoking history quit in 1999 no EtOH abuse history   Additional Past Medical and Surgical History seen accompanied by his wife today   Allergies:   No Known Allergies:   Home Meds:  Home Medications: Medication Instructions Status  docusate-senna 50 mg-8.6 mg oral tablet 2 tab(s) orally once a day (at bedtime) Active  aspirin 81 mg oral tablet 1 tab(s) orally once a day  Active  liquid pain medication  Active  Metoprolol Tartrate 50 mg oral tablet 1 tab(s) orally 2 times a day Active  amLODIPine 10 mg oral tablet 1 tab(s) orally once a day Active  hydrochlorothiazide-valsartan 25 mg-320 mg oral tablet 1 tab(s) orally once a day Active  lovastatin 40 mg oral tablet 1 tab(s) orally once a day Active  Vitamin D3 1000 intl units oral capsule 1 cap(s) orally once a day Active   Review of Systems:  General negative   Performance Status (ECOG) 0   Skin negative   Breast negative   Ophthalmologic negative   ENMT see HPI   Respiratory and Thorax negative   Cardiovascular negative   Gastrointestinal negative   Genitourinary negative   Musculoskeletal negative   Neurological negative   Psychiatric negative   Hematology/Lymphatics negative   Endocrine negative   Allergic/Immunologic negative   Review of Systems   review of systems obtained from nurses notes  Nursing Notes:  Nursing Vital Signs and Chemo Nursing Nursing Notes: *CC Vital Signs Flowsheet:   12-Feb-15 09:42  Temp Temperature 97.7  Pulse Pulse 80  Respirations Respirations 20  SBP SBP 117  DBP DBP 77  Pain Scale (0-10)  0  Pulse Oxi  95  Current Weight (kg) (kg) 79.4  Height (cm) centimeters 174  BSA (m2) 1.9   Physical Exam:  General/Skin/HEENT:  General normal   Skin normal   Eyes normal   Additional PE well-developed male in NAD. Oral cavity shows left necrotic lesion in the left tonsillar pillar. There some slight  occlusion of the upper airway. Patient will also has high cervical adenopathy and some fullness in the left supraclavicular fossa. Teeth are in good state of repair.lungs are clear to A&P.patient has had a port placed in the right anterior chest wall   Breasts/Resp/CV/GI/GU:  Respiratory and Thorax normal   Cardiovascular normal   Gastrointestinal normal   Genitourinary normal   MS/Neuro/Psych/Lymph:  Musculoskeletal normal   Neurological normal   Lymphatics normal   Other Results:  Radiology Results: LabUnknown:    05-Feb-15 15:40, PET/CT Scan Head/Neck CA Initial Staging  PACS Image   Nuclear Med:  PET/CT Scan Head/Neck CA Initial Staging   REASON FOR EXAM:    Staging Tonsil CA  COMMENTS:       PROCEDURE: PET - PET/CT INIT STAG HEAD/NECK CA  - May 16 2013  3:40PM     CLINICAL DATA:  Initial treatment strategy for tonsillar invasive  squamous cell cancer.Marland Kitchen    EXAM:  NUCLEAR MEDICINE PET SKULL BASE TO THIGH    FASTING BLOOD GLUCOSE:  Value: 137mg /dl    TECHNIQUE:  12.7 mCi F-18 FDG was injected intravenously. CT data was obtained  and used for attenuation correction and anatomic localization only.  (This was not acquired as a diagnostic CT examination.) Additional  exam technical data entered on technologist worksheet. Dedicated  head and neck images were acquired, and SUV measurements (if any) in  the head and neck regions were performed from these dedicated  images.    COMPARISON:  None.    FINDINGS:  NECK    Centrally necrotic mass of the left tonsillar pillar measures  approximately 3.3 cm in diameter and has a maximum standard uptake  value of 21.0.  1.1 by 0.9 cm soft tissue nodule along the inferior margin of the  left parotid gland has maximum standard uptake value of 8.0.    There are scattered hypermetabolic small station 2 and posterior  cervical triangle lymph nodes. An index left supraclavicular node  measuring 0.8 x 1.2 cm on image 98 of  series 3 has a maximum  standard uptake value of 6.3.    There is equivocal thickening of the left area epiglottic fold  posteriorly, with associated hypermetabolic activity at SUV max 8.3.    CHEST    Mild biapical pleural parenchymal scarring is observed. 3 mm  subpleural nodule in the left upper lobe, image 119 of series 3,  somewhat triangular morphology and not hypermetabolic. 3 x 4 mm left  lower lobe nodule, image 158 of series 3. Left anterior descending  and right coronary artery atherosclerotic calcification.    ABDOMEN/PELVIS    No significant abnormal hypermetabolic activity in the abdomen or  pelvis. There is a fusiform 5.4 by 5.5 cm infrarenal abdominal  aortic aneurysm which extends to the aortic bifurcation and  partially into the right common iliac artery which measures up to  2.3 cm in diameter. Aortoiliac atherosclerotic calcification is  present.    Left kidney lower pole fluid density lesion compatible with cyst  measuring 3.6 x 2.5 cm.  Bilateral inguinal hernia repairs  noted.    SKELETON    No focal hypermetabolic activity to suggest skeletal metastasis.     IMPRESSION:  1. Hypermetabolic 3.3 cm centrally necrotic mass of the left  tonsillar pillar with associated borderline enlarged but  hypermetabolic ipsilateral adenopathy in the left neck and  supraclavicular region.  2. Thickening of the left ariepiglottic fold posteriorly with  associated hypermetabolic activity could represent a small focus of  metastatic disease.  3. Two small pulmonary nodules in the left lung. These areprobably  benign but warrant observation. They are too small for accurate  PET-CT characterization, in the 3-4 millimeter range.  4. Fusiform 5.5 cm infrarenal abdominal aortic aneurysm.      Electronically Signed    By: Sherryl Barters M.D.    On: 05/16/2013 17:35         Verified By: Carron Curie, M.D.,   Relevent Results:   Relevant Scans and Labs PET  scan is reviewed   Assessment and Plan: Impression:   stage IVb squamous carcinoma left tonsillar pillar with cervical or supraclavicular hypermetabolic activity by PET CT scan to receive concurrent chemoradiation with curative intent in 81 year old male Plan:   at this time I recommended going ahead with IMRT radiation therapy to his head and neck region. Would treat up to 7000 cGy to both the primary lesion and hypermetabolic lymph nodes. Would also treat his remaining cervical chain up to 5400 cGy using IMRT dose painting technique. We'll use IMRT to spare spinal cord, salivary glands and oral cavity. Risks and benefits of treatment including increased sensation of taste, oral mucositis, fatigue, dysphagia, skin reaction, all were discussed in depth with the patient and his wife. They both seem tocomprehend my treatment plan well. I have set him up for CT simulation tomorrow he is oriented a port placed. Will coordinate concurrent chemotherapy with medical oncology.  I would like to take this opportunity to thank you for allowing me to continue to participate in this patient's care.  CC Referral:  cc: Dr. Nadeen Landau, Dr. Miguel Aschoff   Electronic Signatures: Baruch Gouty, Roda Shutters (MD)  (Signed 12-Feb-15 12:00)  Authored: HPI, Diagnosis, Past Hx, PFSH, Allergies, Home Meds, ROS, Nursing Notes, Physical Exam, Other Results, Relevent Results, Encounter Assessment and Plan, CC Referring Physician   Last Updated: 12-Feb-15 12:00 by Armstead Peaks (MD)

## 2014-08-02 NOTE — Op Note (Signed)
PATIENT NAME:  Geoffrey Gregory, Geoffrey Gregory MR#:  [id number] DATE OF BIRTH:  11/09/1932  DATE OF PROCEDURE:  12/30/2013  PREOPERATIVE DIAGNOSES:  1.  Carcinoma of tonsil.  2.  Left axilla lymphadenopathy.   POSTOPERATIVE DIAGNOSES:  1.  Carcinoma of tonsil.  2.  Left axilla lymphadenopathy.   OPERATION: Excision left axillary lymph node.   ANESTHESIA: General.   COMPLICATIONS: None.   ESTIMATED BLOOD LOSS: Minimal.   DRAINS: None.   DESCRIPTION OF PROCEDURE: The patient was put to sleep in the supine position on the operating table and the left axilla prepped and draped out as a sterile field. The patient had a palpable 1 to 2 cm lymph node in the left axilla which had shown to be positive on a recent PET scan and needle biopsy of this suggested squamous cells, suggesting a possible metastatic involvement, however excision was recommended for additional molecular studies and stainings to for a potential source cancer for this cancer. Accordingly with the area of the lymph node palpated and marked the skin was infiltrated with some local 0.5% Marcaine and skin incision was made in anterior-posterior direction and deepened through the axillary fat pad. Exploration showed that there was no significantly large node except for one that was palpable which was about 2 cm, but this did not however appear to be malignant on gross inspection, it had more fatty features to it, but it was well demarcated from the rest of the axillary tissue and there did not appear to be any other palpable nodes. This node was excised out and was sent in formalin for pathologic evaluation. Bleeding was controlled with cautery and the deep tissue closed with 2-0 Vicryl and the skin closed with subcuticular 4-0 Vicryl covered with Dermabond. The procedure was well tolerated. He was subsequently extubated and returned to the recovery room in stable condition    ____________________________ S.Robinette Haines, MD sgs:bu D: 12/30/2013 14:05:57  ET T: 12/30/2013 14:50:56 ET JOB#: [id number]  cc: S.G. [name], MD, <Dictator> [facility] Robinette Haines MD ELECTRONICALLY SIGNED 01/06/2014 8:47

## 2014-08-05 ENCOUNTER — Ambulatory Visit
Admit: 2014-08-05 | Disposition: A | Discharge status: Home / Self Care | Payer: Self-pay | Attending: Radiation Oncology | Admitting: Radiation Oncology

## 2014-08-06 LAB — CBC CANCER CENTER
Basophil #: 0.1 x10 3/mm (ref 0.0–0.1)
Basophil %: 1.1 %
Eosinophil #: 0.6 x10 3/mm (ref 0.0–0.7)
Eosinophil %: 11.6 %
HCT: 38.6 % — ABNORMAL LOW (ref 40.0–52.0)
HGB: 12.6 g/dL — ABNORMAL LOW (ref 13.0–18.0)
LYMPHS ABS: 1 x10 3/mm (ref 1.0–3.6)
Lymphocyte %: 18.1 %
MCH: 28.5 pg (ref 26.0–34.0)
MCHC: 32.6 g/dL (ref 32.0–36.0)
MCV: 87 fL (ref 80–100)
MONOS PCT: 10.5 %
Monocyte #: 0.6 x10 3/mm (ref 0.2–1.0)
Neutrophil #: 3.1 x10 3/mm (ref 1.4–6.5)
Neutrophil %: 58.7 %
PLATELETS: 163 x10 3/mm (ref 150–440)
RBC: 4.42 10*6/uL (ref 4.40–5.90)
RDW: 15.7 % — AB (ref 11.5–14.5)
WBC: 5.3 x10 3/mm (ref 3.8–10.6)

## 2014-08-06 LAB — COMPREHENSIVE METABOLIC PANEL
Albumin: 4 g/dL
Alkaline Phosphatase: 60 U/L
Anion Gap: 4 — ABNORMAL LOW (ref 7–16)
BUN: 32 mg/dL — AB
Bilirubin,Total: 1 mg/dL
CHLORIDE: 100 mmol/L — AB
CO2: 27 mmol/L
CREATININE: 1.61 mg/dL — AB
Calcium, Total: 8.9 mg/dL
GFR CALC AF AMER: 46 — AB
GFR CALC NON AF AMER: 40 — AB
GLUCOSE: 161 mg/dL — AB
POTASSIUM: 4.4 mmol/L
SGOT(AST): 28 U/L
SGPT (ALT): 19 U/L
Sodium: 131 mmol/L — ABNORMAL LOW
TOTAL PROTEIN: 7.6 g/dL

## 2014-09-17 DIAGNOSIS — N529 Male erectile dysfunction, unspecified: Secondary | ICD-10-CM | POA: Insufficient documentation

## 2014-09-17 DIAGNOSIS — I7090 Unspecified atherosclerosis: Secondary | ICD-10-CM | POA: Insufficient documentation

## 2014-09-17 DIAGNOSIS — E139 Other specified diabetes mellitus without complications: Secondary | ICD-10-CM | POA: Insufficient documentation

## 2014-09-17 DIAGNOSIS — F1021 Alcohol dependence, in remission: Secondary | ICD-10-CM | POA: Insufficient documentation

## 2014-09-17 DIAGNOSIS — R972 Elevated prostate specific antigen [PSA]: Secondary | ICD-10-CM | POA: Insufficient documentation

## 2014-09-17 DIAGNOSIS — Z87891 Personal history of nicotine dependence: Secondary | ICD-10-CM | POA: Insufficient documentation

## 2014-09-17 DIAGNOSIS — R351 Nocturia: Secondary | ICD-10-CM | POA: Insufficient documentation

## 2014-09-17 DIAGNOSIS — R1312 Dysphagia, oropharyngeal phase: Secondary | ICD-10-CM | POA: Insufficient documentation

## 2014-09-17 DIAGNOSIS — I889 Nonspecific lymphadenitis, unspecified: Secondary | ICD-10-CM | POA: Insufficient documentation

## 2014-09-17 DIAGNOSIS — C801 Malignant (primary) neoplasm, unspecified: Secondary | ICD-10-CM | POA: Insufficient documentation

## 2014-09-17 DIAGNOSIS — I10 Essential (primary) hypertension: Secondary | ICD-10-CM | POA: Insufficient documentation

## 2014-09-17 DIAGNOSIS — C099 Malignant neoplasm of tonsil, unspecified: Secondary | ICD-10-CM | POA: Insufficient documentation

## 2014-09-17 DIAGNOSIS — I2510 Atherosclerotic heart disease of native coronary artery without angina pectoris: Secondary | ICD-10-CM | POA: Insufficient documentation

## 2014-09-17 DIAGNOSIS — E7800 Pure hypercholesterolemia, unspecified: Secondary | ICD-10-CM | POA: Insufficient documentation

## 2014-09-17 DIAGNOSIS — E559 Vitamin D deficiency, unspecified: Secondary | ICD-10-CM | POA: Insufficient documentation

## 2014-10-01 ENCOUNTER — Inpatient Hospital Stay: Payer: Medicare HMO | Attending: Family Medicine

## 2014-10-15 ENCOUNTER — Other Ambulatory Visit: Payer: Self-pay

## 2014-10-15 MED ORDER — METOPROLOL TARTRATE 50 MG PO TABS: 50.0000 mg | ORAL_TABLET | Freq: Two times a day (BID) | ORAL | Status: DC

## 2014-10-15 MED ORDER — LOVASTATIN 40 MG PO TABS: 40.0000 mg | ORAL_TABLET | Freq: Every day | ORAL | Status: DC

## 2014-10-17 ENCOUNTER — Other Ambulatory Visit: Payer: Self-pay

## 2014-10-17 MED ORDER — METOPROLOL TARTRATE 50 MG PO TABS: 50.0000 mg | ORAL_TABLET | Freq: Two times a day (BID) | ORAL | Status: DC

## 2014-10-17 MED ORDER — LOVASTATIN 40 MG PO TABS: 40.0000 mg | ORAL_TABLET | Freq: Every day | ORAL | Status: DC

## 2014-12-05 ENCOUNTER — Encounter (INDEPENDENT_AMBULATORY_CARE_PROVIDER_SITE_OTHER): Payer: Self-pay

## 2014-12-05 ENCOUNTER — Inpatient Hospital Stay: Payer: Medicare HMO

## 2014-12-05 ENCOUNTER — Inpatient Hospital Stay: Payer: Medicare HMO | Attending: Family Medicine | Admitting: Family Medicine

## 2014-12-05 VITALS — BP 190/95 | HR 71 | Temp 96.7°F | Resp 16 | Wt 145.9 lb

## 2014-12-05 DIAGNOSIS — Z452 Encounter for adjustment and management of vascular access device: Secondary | ICD-10-CM | POA: Insufficient documentation

## 2014-12-05 DIAGNOSIS — R590 Localized enlarged lymph nodes: Secondary | ICD-10-CM | POA: Insufficient documentation

## 2014-12-05 DIAGNOSIS — Z87891 Personal history of nicotine dependence: Secondary | ICD-10-CM | POA: Insufficient documentation

## 2014-12-05 DIAGNOSIS — C099 Malignant neoplasm of tonsil, unspecified: Secondary | ICD-10-CM | POA: Insufficient documentation

## 2014-12-05 DIAGNOSIS — Z923 Personal history of irradiation: Secondary | ICD-10-CM | POA: Diagnosis not present

## 2014-12-05 DIAGNOSIS — Z9221 Personal history of antineoplastic chemotherapy: Secondary | ICD-10-CM | POA: Diagnosis not present

## 2014-12-05 DIAGNOSIS — E119 Type 2 diabetes mellitus without complications: Secondary | ICD-10-CM | POA: Diagnosis not present

## 2014-12-05 DIAGNOSIS — I10 Essential (primary) hypertension: Secondary | ICD-10-CM | POA: Insufficient documentation

## 2014-12-05 DIAGNOSIS — E559 Vitamin D deficiency, unspecified: Secondary | ICD-10-CM | POA: Insufficient documentation

## 2014-12-05 DIAGNOSIS — Z79899 Other long term (current) drug therapy: Secondary | ICD-10-CM | POA: Diagnosis not present

## 2014-12-05 DIAGNOSIS — E785 Hyperlipidemia, unspecified: Secondary | ICD-10-CM | POA: Diagnosis not present

## 2014-12-05 DIAGNOSIS — I2510 Atherosclerotic heart disease of native coronary artery without angina pectoris: Secondary | ICD-10-CM | POA: Insufficient documentation

## 2014-12-05 LAB — COMPREHENSIVE METABOLIC PANEL
ALK PHOS: 51 U/L (ref 38–126)
ALT: 16 U/L — AB (ref 17–63)
AST: 24 U/L (ref 15–41)
Albumin: 3.3 g/dL — ABNORMAL LOW (ref 3.5–5.0)
Anion gap: 7 (ref 5–15)
BILIRUBIN TOTAL: 0.8 mg/dL (ref 0.3–1.2)
BUN: 27 mg/dL — AB (ref 6–20)
CALCIUM: 8.7 mg/dL — AB (ref 8.9–10.3)
CO2: 27 mmol/L (ref 22–32)
CREATININE: 1.57 mg/dL — AB (ref 0.61–1.24)
Chloride: 97 mmol/L — ABNORMAL LOW (ref 101–111)
GFR, EST AFRICAN AMERICAN: 47 mL/min — AB (ref >60)
GFR, EST NON AFRICAN AMERICAN: 40 mL/min — AB (ref >60)
Glucose, Bld: 190 mg/dL — ABNORMAL HIGH (ref 65–99)
Potassium: 4.2 mmol/L (ref 3.5–5.1)
Sodium: 131 mmol/L — ABNORMAL LOW (ref 135–145)
TOTAL PROTEIN: 6.3 g/dL — AB (ref 6.5–8.1)

## 2014-12-05 LAB — CBC WITH DIFFERENTIAL/PLATELET
BASOS ABS: 0.1 10*3/uL (ref 0–0.1)
Basophils Relative: 1 %
Eosinophils Absolute: 0.5 10*3/uL (ref 0–0.7)
Eosinophils Relative: 8 %
HEMATOCRIT: 38.8 % — AB (ref 40.0–52.0)
HEMOGLOBIN: 12.8 g/dL — AB (ref 13.0–18.0)
LYMPHS PCT: 14 %
Lymphs Abs: 1 10*3/uL (ref 1.0–3.6)
MCH: 28.8 pg (ref 26.0–34.0)
MCHC: 33 g/dL (ref 32.0–36.0)
MCV: 87.2 fL (ref 80.0–100.0)
Monocytes Absolute: 0.5 10*3/uL (ref 0.2–1.0)
Monocytes Relative: 7 %
NEUTROS ABS: 4.8 10*3/uL (ref 1.4–6.5)
Neutrophils Relative %: 70 %
Platelets: 183 10*3/uL (ref 150–440)
RBC: 4.45 MIL/uL (ref 4.40–5.90)
RDW: 14.9 % — ABNORMAL HIGH (ref 11.5–14.5)
WBC: 6.9 10*3/uL (ref 3.8–10.6)

## 2014-12-05 MED ORDER — HEPARIN SOD (PORK) LOCK FLUSH 100 UNIT/ML IV SOLN
500.0000 [IU] | Freq: Once | INTRAVENOUS | Status: AC
  Administered 2014-12-05: 500 [IU] via INTRAVENOUS

## 2014-12-05 MED ORDER — SODIUM CHLORIDE 0.9 % IJ SOLN
10.0000 mL | INTRAMUSCULAR | Status: AC | PRN
  Administered 2014-12-05: 10 mL via INTRAVENOUS
  Filled 2014-12-05: qty 10

## 2014-12-05 MED ORDER — HEPARIN SOD (PORK) LOCK FLUSH 100 UNIT/ML IV SOLN
INTRAVENOUS | Status: AC
  Filled 2014-12-05: qty 5

## 2014-12-05 NOTE — Progress Notes (Signed)
[place]  [phone]) [phone]  Fax:([phone]     Geoffrey Gregory DOB: 1933/03/28  MR#: [id number]  YTW#:[id number]  Patient Care Team: Jerrol Banana., MD as PCP - General (Family Medicine) Forest Gleason, MD (Unknown Physician Specialty) Christene Lye, MD (General Surgery)  CHIEF COMPLAINT:  Chief Complaint  Patient presents with  . Follow-up    Will also have his port flushed today...   81 year old male referred by [facility]  with newly diagnosed left tonsillar squamous cell cancer patient had presented to Dr. Rosanna Randy for left-sided throat pain and his wife had noted some mild swelling in his neck.  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  Had been previously treated with oral antibiotics .remote history of smoking that he quit in 1999. Other history significant for diabetes type 2 elevated PSA coronary artery disease and carotid artery stenosis. Patient underwent fiberoptic laryngoscopy on 05/09/2013 with findings of left posterior displacement fullness inferior aspect of the left nasopharynxwith findings highly suspicious for tonsillar squamous cell carcinoma. Dr. Carlis Abbott noted  tonsillar mass is 3 x 3 cm also noted a discrete left neck nodule 1.5 x 2 and centimeters, was not able to be removed due to extension beyond the tonsillar fossa.  INTERVAL HISTORY:  Patient is here for further evaluation and treatment consideration regarding previous history of left tonsillar cancer. He subsequently developed enlarged lymph nodes in the left axilla. He had an excisional biopsy of the left axillary lymph nodes that was reported as negative for malignancy. CT scan also showed some enlargement in lymph nodes but no other areas of disease. He denies any fever, chills, nausea, vomiting, diarrhea, cough, shortness of breath, or chest pain. Denies any new areas of swelling or new nodules. Overall patient is  feeling very well and denies any acute complaints.   REVIEW OF SYSTEMS:   Review of Systems  Constitutional: Negative for fever, chills, weight loss, malaise/fatigue and diaphoresis.  HENT: Negative for congestion, ear discharge, ear pain, hearing loss, nosebleeds, sore throat and tinnitus.   Eyes: Negative for blurred vision, double vision, photophobia, pain, discharge and redness.  Respiratory: Negative for cough, hemoptysis, sputum production, shortness of breath, wheezing and stridor.   Cardiovascular: Negative for chest pain, palpitations, orthopnea, claudication, leg swelling and PND.  Gastrointestinal: Negative for heartburn, nausea, vomiting, abdominal pain, diarrhea, constipation, blood in stool and melena.  Genitourinary: Negative.   Musculoskeletal: Negative.   Skin: Negative.   Neurological: Negative for dizziness, tingling, focal weakness, seizures, weakness and headaches.  Endo/Heme/Allergies: Does not bruise/bleed easily.  Psychiatric/Behavioral: Negative for depression. The patient is not nervous/anxious and does not have insomnia.     As per HPI. Otherwise, a complete review of systems is negatve.   PAST MEDICAL HISTORY: Past Medical History  Diagnosis Date  . Hyperlipidemia   . 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 Hypertension   . Diabetes mellitus without complication   . CAD (coronary artery disease)   . Vitamin D deficiency   . AAA (abdominal aortic aneurysm)     PAST SURGICAL HISTORY: Past Surgical History  Procedure Laterality Date  . Portacath placement  Fe 2015    [name]  . Axillary lymph node biopsy Left 11-28-13    atyical squamous cells  . Carotid stent  1999  . Hernia repair Bilateral 10-06-00    inguinal/ Dr. [name]  . Lymph node dissection Left 12-30-13  axilla    FAMILY HISTORY Family History  Problem Relation Age of Onset  . Diabetes Mother   . Heart disease Mother   . Stroke Sister   . Kidney disease Sister     kidney  failure  . Hypertension Brother     GYNECOLOGIC HISTORY:  No LMP for male patient.     ADVANCED DIRECTIVES:    HEALTH MAINTENANCE: Social History  Substance Use Topics  . Smoking status: Former Smoker -- 1.00 packs/day for 15 years    Types: Cigarettes    Quit date: 04/11/1997  . Smokeless tobacco: Never Used  . Alcohol Use: No     Colonoscopy:  PAP:  Bone density:  Lipid panel:  No Known Allergies  Current Outpatient Prescriptions  Medication Sig Dispense Refill  . acetaminophen (TYLENOL) 500 MG tablet Take 500 mg by mouth every 6 (six) hours as needed.    Marland Kitchen aspirin 81 MG tablet Take by mouth daily.    . Cholecalciferol (VITAMIN D3) 1000 UNITS CAPS Take 1 capsule by mouth daily.    . hydrochlorothiazide (HYDRODIURIL) 25 MG tablet Take by mouth daily.    Marland Kitchen lovastatin (MEVACOR) 40 MG tablet Take 1 tablet (40 mg total) by mouth at bedtime. 30 tablet 6  . metoprolol (LOPRESSOR) 50 MG tablet Take 1 tablet (50 mg total) by mouth 2 (two) times daily. 60 tablet 6  . omeprazole (PRILOSEC) 40 MG capsule Take 40 mg by mouth daily.     . prochlorperazine (COMPAZINE) 10 MG tablet Take 10 mg by mouth every 6 (six) hours as needed for nausea or vomiting.     No current facility-administered medications for this visit.    OBJECTIVE: BP 190/95 mmHg  Pulse 71  Temp(Src) 96.7 F (35.9 C) (Tympanic)  Resp 16  Wt 145 lb 15.1 oz (66.2 kg)   Body mass index is 22.85 kg/(m^2).    ECOG FS:0 - Asymptomatic  General: Well-developed, well-nourished, no acute distress. Eyes: Pink conjunctiva, anicteric sclera. HEENT: Normocephalic, moist mucous membranes, clear oropharnyx. Lungs: Clear to auscultation bilaterally. Heart: Regular rate and rhythm. No rubs, murmurs, or gallops. Abdomen: Soft, nontender, nondistended. No organomegaly noted, normoactive bowel sounds. Musculoskeletal: No edema, cyanosis, or clubbing. Neuro: Alert, answering all questions appropriately. Cranial nerves grossly  intact. Skin: No rashes or petechiae noted. Psych: Normal affect. Lymphatics: No cervical, clavicular, axillary lymphadenopathy   LAB RESULTS:  Appointment on 12/05/2014  Component Date Value Ref Range Status  . WBC 12/05/2014 6.9  3.8 - 10.6 K/uL Final  . RBC 12/05/2014 4.45  4.40 - 5.90 MIL/uL Final  . Hemoglobin 12/05/2014 12.8* 13.0 - 18.0 g/dL Final  . HCT 12/05/2014 38.8* 40.0 - 52.0 % Final  . MCV 12/05/2014 87.2  80.0 - 100.0 fL Final  . MCH 12/05/2014 28.8  26.0 - 34.0 pg Final  . MCHC 12/05/2014 33.0  32.0 - 36.0 g/dL Final  . RDW 12/05/2014 14.9* 11.5 - 14.5 % Final  . Platelets 12/05/2014 183  150 - 440 K/uL Final  . Neutrophils Relative % 12/05/2014 70   Final  . Neutro Abs 12/05/2014 4.8  1.4 - 6.5 K/uL Final  . Lymphocytes Relative 12/05/2014 14   Final  . Lymphs Abs 12/05/2014 1.0  1.0 - 3.6 K/uL Final  . Monocytes Relative 12/05/2014 7   Final  . Monocytes Absolute 12/05/2014 0.5  0.2 - 1.0 K/uL Final  . Eosinophils Relative 12/05/2014 8   Final  . Eosinophils Absolute 12/05/2014 0.5  0 - 0.7  K/uL Final  . Basophils Relative 12/05/2014 1   Final  . Basophils Absolute 12/05/2014 0.1  0 - 0.1 K/uL Final  . Sodium 12/05/2014 131* 135 - 145 mmol/L Final  . Potassium 12/05/2014 4.2  3.5 - 5.1 mmol/L Final  . Chloride 12/05/2014 97* 101 - 111 mmol/L Final  . CO2 12/05/2014 27  22 - 32 mmol/L Final  . Glucose, Bld 12/05/2014 190* 65 - 99 mg/dL Final  . BUN 12/05/2014 27* 6 - 20 mg/dL Final  . Creatinine, Ser 12/05/2014 1.57* 0.61 - 1.24 mg/dL Final  . Calcium 12/05/2014 8.7* 8.9 - 10.3 mg/dL Final  . Total Protein 12/05/2014 6.3* 6.5 - 8.1 g/dL Final  . Albumin 12/05/2014 3.3* 3.5 - 5.0 g/dL Final  . AST 12/05/2014 24  15 - 41 U/L Final  . ALT 12/05/2014 16* 17 - 63 U/L Final  . Alkaline Phosphatase 12/05/2014 51  38 - 126 U/L Final  . Total Bilirubin 12/05/2014 0.8  0.3 - 1.2 mg/dL Final  . GFR calc non Af Amer 12/05/2014 40* >60 mL/min Final  . GFR calc Af Amer  12/05/2014 47* >60 mL/min Final   Comment: (NOTE) The eGFR has been calculated using the CKD EPI equation. This calculation has not been validated in all clinical situations. eGFR's persistently <60 mL/min signify possible Chronic Kidney Disease.   . Anion gap 12/05/2014 7  5 - 15 Final    STUDIES: No results found.  ASSESSMENT:  Left tonsillar cancer with lymphovascular invasion, status post combined modality chemotherapy and XRT.  PLAN:   1. Left Tonsillar cancer. Clinically there is no evidence of recurrent disease. As previously noted, Left axillary lymph node enlargement. Excisional biopsy was negative. Lymph node remains palpable but has not changed in size. Most recent CT scan in January 2016 revealed increase in size of left axillary lymph node. Retroperitoneal lymph nodes were not visualized on recent CT scan, CT scan of head and neck area did not reveal any evidence of recurrent disease.  Patient advised to routinely palpate axillary, inguinal and cervical lymph nodes and inform us if there is any enlargement.   We'll continue with routine follow-up with [name] in 4 months.  Patient expressed understanding and was in agreement with this plan. He also understands that He can call clinic at any time with any questions, concerns, or complaints.   [name] was available for consultation and review of plan of care for this patient.    Evlyn Kanner, NP   12/05/2014 10:51 AM

## 2014-12-08 ENCOUNTER — Encounter: Payer: Self-pay | Admitting: Family Medicine

## 2014-12-08 ENCOUNTER — Ambulatory Visit (INDEPENDENT_AMBULATORY_CARE_PROVIDER_SITE_OTHER): Payer: Medicare HMO | Admitting: Family Medicine

## 2014-12-08 ENCOUNTER — Telehealth: Payer: Self-pay | Admitting: Family Medicine

## 2014-12-08 VITALS — BP 148/62 | HR 78 | Temp 98.2°F | Resp 16 | Wt 147.0 lb

## 2014-12-08 DIAGNOSIS — I10 Essential (primary) hypertension: Secondary | ICD-10-CM | POA: Diagnosis not present

## 2014-12-08 DIAGNOSIS — I2510 Atherosclerotic heart disease of native coronary artery without angina pectoris: Secondary | ICD-10-CM | POA: Diagnosis not present

## 2014-12-08 DIAGNOSIS — E7800 Pure hypercholesterolemia, unspecified: Secondary | ICD-10-CM

## 2014-12-08 DIAGNOSIS — Z23 Encounter for immunization: Secondary | ICD-10-CM

## 2014-12-08 DIAGNOSIS — E139 Other specified diabetes mellitus without complications: Secondary | ICD-10-CM | POA: Diagnosis not present

## 2014-12-08 NOTE — Telephone Encounter (Signed)
Amy pharmacist advised that it is ok to do this-aa

## 2014-12-08 NOTE — Telephone Encounter (Signed)
Pharmacy called wanting to know if they could have a 90 refill on his medications metoprolol (LOPRESSOR) 50 MG tablet Taking 10/17/14 -- Geoffrey Gregory., MD Take 1 tablet (50 mg total) by mouth 2 (two) times daily.  Lovastatin and Hydrochlorothiazide.  Could not get it to copy.  Thanks Con Memos

## 2014-12-08 NOTE — Progress Notes (Signed)
Patient ID: Geoffrey Gregory, male   DOB: 1932/10/13, 81 y.o.   MRN: [id number]    Subjective:  HPI  Diabetes Mellitus Type II, Follow-up:   Lab Results  Component Value Date   HGBA1C 5.5 04/16/2014    Last seen for diabetes 4 months ago.  Management changes included none. He reports good compliance with treatment. He is not having side effects.  Current symptoms include none Home blood sugar records: not being checked.  Episodes of hypoglycemia? no   Current Insulin Regimen: n/a Most Recent Eye Exam: about 2 years ago. Weight trend: stable Current exercise: Not right now due to the hot weather  Pertinent Labs:    Component Value Date/Time   CHOL 114 12/12/2013   TRIG 116 12/12/2013   CREATININE 1.57* 12/05/2014 1002   CREATININE 1.61* 08/06/2014 1015   CREATININE 1.5* 05/01/2014    Wt Readings from Last 3 Encounters:  12/08/14 147 lb (66.679 kg)  12/05/14 145 lb 15.1 oz (66.2 kg)  06/09/14 147 lb (66.679 kg)    ------------------------------------------------------------------------  Hypertension, follow-up:  BP Readings from Last 3 Encounters:  12/08/14 148/62  12/05/14 190/95  06/09/14 136/82    He was last seen for hypertension 4 months ago.  BP at that visit was 136/82. Management changes since that visit include none. He reports good compliance with treatment. He is not having side effects.  Outside blood pressures are being checked when he feels bad and run about 120/78. He is experiencing none.      Wt Readings from Last 3 Encounters:  12/08/14 147 lb (66.679 kg)  12/05/14 145 lb 15.1 oz (66.2 kg)  06/09/14 147 lb (66.679 kg)   ------------------------------------------------------------------------       Prior to Admission medications   Medication Sig Start Date End Date Taking? Authorizing Provider  acetaminophen (TYLENOL) 500 MG tablet Take 500 mg by mouth every 6 (six) hours as needed.   Yes Historical Provider, MD  aspirin 81 MG  tablet Take by mouth daily. 06/08/11  Yes Historical Provider, MD  Cholecalciferol (VITAMIN D3) 1000 UNITS CAPS Take 1 capsule by mouth daily.   Yes Historical Provider, MD  hydrochlorothiazide (HYDRODIURIL) 25 MG tablet Take by mouth daily. 04/16/14  Yes Historical Provider, MD  lovastatin (MEVACOR) 40 MG tablet Take 1 tablet (40 mg total) by mouth at bedtime. 10/17/14  Yes [name], MD  metoprolol (LOPRESSOR) 50 MG tablet Take 1 tablet (50 mg total) by mouth 2 (two) times daily. 10/17/14  Yes [name], MD  omeprazole (PRILOSEC) 40 MG capsule Take 40 mg by mouth daily.  12/11/13  Yes Historical Provider, MD    Patient Active Problem List   Diagnosis Date Noted  . Alcohol dependence in remission 09/17/2014  . Arterial vascular disease 09/17/2014  . Arteriosclerosis of coronary artery 09/17/2014  . DM (diabetes mellitus), secondary 09/17/2014  . Dysphagia, oropharyngeal 09/17/2014  . Impotence of organic origin 09/17/2014  . Abnormal prostate specific antigen 09/17/2014  . Essential (primary) hypertension 09/17/2014  . Hypercholesteremia 09/17/2014  . Adenitis 09/17/2014  . Cancer of tonsil 09/17/2014  . Excessive urination at night 09/17/2014  . Cancer 09/17/2014  . Personal history of tobacco use, presenting hazards to health 09/17/2014  . Avitaminosis D 09/17/2014  . Carotid artery narrowing 01/21/2014  . Acid reflux 01/21/2014    Past Medical History  Diagnosis Date  . Hyperlipidemia   . 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 Hypertension   .  Diabetes mellitus without complication   . CAD (coronary artery disease)   . Vitamin D deficiency   . AAA (abdominal aortic aneurysm)     Social History   Social History  . Marital Status: Married    Spouse Name: N/A  . Number of Children: N/A  . Years of Education: N/A   Occupational History  . Not on file.   Social History Main Topics  . Smoking status: Former Smoker -- 1.00 packs/day for 15 years     Types: Cigarettes    Quit date: 04/11/1997  . Smokeless tobacco: Never Used  . Alcohol Use: No  . Drug Use: No  . Sexual Activity: Not on file   Other Topics Concern  . Not on file   Social History Narrative    No Known Allergies  Review of Systems  Constitutional: Negative.   HENT: Negative.   Eyes: Negative.   Respiratory: Negative.   Cardiovascular: Negative.   Gastrointestinal: Negative.   Genitourinary: Negative.   Musculoskeletal: Negative.   Skin: Negative.   Neurological: Negative.   Endo/Heme/Allergies: Negative.   Psychiatric/Behavioral: Negative.     Immunization History  Administered Date(s) Administered  . Pneumococcal Polysaccharide-23 01/13/2009  . Td 09/10/2008   Objective:  BP 148/62 mmHg  Pulse 78  Temp(Src) 98.2 F (36.8 C) (Oral)  Resp 16  Wt 147 lb (66.679 kg)  Physical Exam  Constitutional: He is oriented to person, place, and time and well-developed, well-nourished, and in no distress.  HENT:  Head: Normocephalic and atraumatic.  Right Ear: External ear normal.  Left Ear: External ear normal.  Nose: Nose normal.  Mouth/Throat: Oropharynx is clear and moist.  Single BB sized lymph node in the mid right posterior cervical chain. Another BB size large metastases in the left mandibular area is just below where the patient had hurt his tonsillar cancer.  Eyes: Conjunctivae are normal.  Neck: Neck supple.  Cardiovascular: Normal rate, regular rhythm and normal heart sounds.   Pulmonary/Chest: Effort normal and breath sounds normal.  Abdominal: Soft. Bowel sounds are normal.  Neurological: He is alert and oriented to person, place, and time. Gait normal.  Skin: Skin is warm.  Psychiatric: Mood, memory, affect and judgment normal.    Lab Results  Component Value Date   WBC 6.9 12/05/2014   HGB 12.8* 12/05/2014   HCT 38.8* 12/05/2014   PLT 183 12/05/2014   GLUCOSE 190* 12/05/2014   CHOL 114 12/12/2013   TRIG 116 12/12/2013   HDL  37 12/12/2013   LDLCALC 54 12/12/2013   TSH 2.02 12/12/2013   PSA 2.7 07/27/2009   INR 1.1 03/13/2014   HGBA1C 5.5 04/16/2014    CMP     Component Value Date/Time   NA 131* 12/05/2014 1002   NA 131* 08/06/2014 1015   NA 131* 05/01/2014   K 4.2 12/05/2014 1002   K 4.4 08/06/2014 1015   CL 97* 12/05/2014 1002   CL 100* 08/06/2014 1015   CO2 27 12/05/2014 1002   CO2 27 08/06/2014 1015   GLUCOSE 190* 12/05/2014 1002   GLUCOSE 161* 08/06/2014 1015   BUN 27* 12/05/2014 1002   BUN 32* 08/06/2014 1015   BUN 27* 05/01/2014   CREATININE 1.57* 12/05/2014 1002   CREATININE 1.61* 08/06/2014 1015   CREATININE 1.5* 05/01/2014   CALCIUM 8.7* 12/05/2014 1002   CALCIUM 8.9 08/06/2014 1015   PROT 6.3* 12/05/2014 1002   PROT 7.6 08/06/2014 1015   ALBUMIN 3.3* 12/05/2014 1002   ALBUMIN  4.0 08/06/2014 1015   AST 24 12/05/2014 1002   AST 28 08/06/2014 1015   ALT 16* 12/05/2014 1002   ALT 19 08/06/2014 1015   ALKPHOS 51 12/05/2014 1002   ALKPHOS 60 08/06/2014 1015   BILITOT 0.8 12/05/2014 1002   BILITOT 1.0 08/06/2014 1015   GFRNONAA 40* 12/05/2014 1002   GFRNONAA 40* 08/06/2014 1015   GFRNONAA 45* 05/07/2014 1041   GFRAA 47* 12/05/2014 1002   GFRAA 46* 08/06/2014 1015   GFRAA 54* 05/07/2014 1041    Assessment and Plan :  1. Essential (primary) hypertension  - CBC with Differential/Platelet - Comprehensive Metabolic Panel (CMET)  2. DM (diabetes mellitus), secondary Excellent control. This is actually pre diabetic range. - POCT HgB A1C--5.7 today  3. Hypercholesteremia  - Lipid Panel With LDL/HDL Ratio - TSH  4. Arteriosclerosis of coronary artery All risk factors treated - TSH  5. Need for pneumococcal vaccination  - Pneumococcal conjugate vaccine 13-valent 6. History of cancer of the tonsil Doing well. Followed at  cancer center. I have done the exam and reviewed the above chart and it is accurate to the best of my knowledge.  Miguel Aschoff MD [facility] 12/08/2014 9:51 AM

## 2014-12-10 LAB — COMPREHENSIVE METABOLIC PANEL
A/G RATIO: 1.5 (ref 1.1–2.5)
ALT: 31 IU/L (ref 0–44)
AST: 37 IU/L (ref 0–40)
Albumin: 3.5 g/dL (ref 3.5–4.8)
Alkaline Phosphatase: 66 IU/L (ref 39–117)
BILIRUBIN TOTAL: 0.5 mg/dL (ref 0.0–1.2)
BUN/Creatinine Ratio: 18 (ref 10–22)
BUN: 26 mg/dL (ref 8–27)
CHLORIDE: 94 mmol/L — AB (ref 97–108)
CO2: 23 mmol/L (ref 18–29)
Calcium: 9.2 mg/dL (ref 8.6–10.2)
Creatinine, Ser: 1.46 mg/dL — ABNORMAL HIGH (ref 0.76–1.27)
GFR calc non Af Amer: 45 mL/min/{1.73_m2} — ABNORMAL LOW (ref >59)
GFR, EST AFRICAN AMERICAN: 52 mL/min/{1.73_m2} — AB (ref >59)
GLUCOSE: 114 mg/dL — AB (ref 65–99)
Globulin, Total: 2.4 g/dL (ref 1.5–4.5)
POTASSIUM: 5.5 mmol/L — AB (ref 3.5–5.2)
Sodium: 133 mmol/L — ABNORMAL LOW (ref 134–144)
TOTAL PROTEIN: 5.9 g/dL — AB (ref 6.0–8.5)

## 2014-12-10 LAB — CBC WITH DIFFERENTIAL/PLATELET
BASOS ABS: 0.1 10*3/uL (ref 0.0–0.2)
Basos: 1 %
EOS (ABSOLUTE): 0.5 10*3/uL — AB (ref 0.0–0.4)
Eos: 7 %
Hematocrit: 38 % (ref 37.5–51.0)
Hemoglobin: 12.6 g/dL (ref 12.6–17.7)
IMMATURE GRANS (ABS): 0 10*3/uL (ref 0.0–0.1)
IMMATURE GRANULOCYTES: 0 %
LYMPHS: 13 %
Lymphocytes Absolute: 1 10*3/uL (ref 0.7–3.1)
MCH: 28.6 pg (ref 26.6–33.0)
MCHC: 33.2 g/dL (ref 31.5–35.7)
MCV: 86 fL (ref 79–97)
MONOS ABS: 0.9 10*3/uL (ref 0.1–0.9)
Monocytes: 13 %
NEUTROS ABS: 4.9 10*3/uL (ref 1.4–7.0)
NEUTROS PCT: 66 %
PLATELETS: 205 10*3/uL (ref 150–379)
RBC: 4.4 x10E6/uL (ref 4.14–5.80)
RDW: 14.9 % (ref 12.3–15.4)
WBC: 7.3 10*3/uL (ref 3.4–10.8)

## 2014-12-10 LAB — TSH: TSH: 3.17 u[IU]/mL (ref 0.450–4.500)

## 2014-12-10 LAB — LIPID PANEL WITH LDL/HDL RATIO
CHOLESTEROL TOTAL: 111 mg/dL (ref 100–199)
HDL: 44 mg/dL (ref >39)
LDL Calculated: 55 mg/dL (ref 0–99)
LDl/HDL Ratio: 1.3 ratio units (ref 0.0–3.6)
TRIGLYCERIDES: 59 mg/dL (ref 0–149)
VLDL CHOLESTEROL CAL: 12 mg/dL (ref 5–40)

## 2014-12-25 ENCOUNTER — Encounter: Payer: Self-pay | Admitting: Family Medicine

## 2014-12-25 ENCOUNTER — Ambulatory Visit (INDEPENDENT_AMBULATORY_CARE_PROVIDER_SITE_OTHER): Payer: Medicare HMO | Admitting: Family Medicine

## 2014-12-25 VITALS — BP 128/64 | HR 81 | Temp 97.7°F | Resp 16 | Wt 148.0 lb

## 2014-12-25 DIAGNOSIS — S29011A Strain of muscle and tendon of front wall of thorax, initial encounter: Secondary | ICD-10-CM | POA: Diagnosis not present

## 2014-12-25 NOTE — Patient Instructions (Signed)
Discussed using Tylenol up to 3000 mg./day.

## 2014-12-25 NOTE — Progress Notes (Signed)
Subjective:     Patient ID: Geoffrey Gregory, male   DOB: [birth year], 81 y.o.   MRN: [id number]  HPI  Chief Complaint  Patient presents with  . Cough    Patient comes in office today with concerns of cough and congestion for one week, patient reports attending family renunion prior to symptoms beginning. Patient complaints or sore ness in the chest but denies being short of breath.   States he was at a family reunion Labor Day weekend. Reports that he was lifting luggage and a cooler and felt it made his chest sore. Reports transient sneezing and runny nose which he states was contributed to by being in cold meeting rooms. No cough. Has taken Tylenol with improvement in his sx.    Review of Systems  Constitutional: Negative for fever and chills.       Objective:   Physical Exam  Constitutional: He appears well-developed and well-nourished. No distress.  Musculoskeletal:  No chest wall tenderness on palpation  Ears: T.M's intact without inflammation Throat: no tonsillar enlargement or exudate Neck: no cervical adenopathy Lungs: clear     Assessment:    1. Chest wall muscle strain, initial encounter    Plan:    Discussed continued use of Tylenol as needed due to decreased renal function.

## 2015-01-05 ENCOUNTER — Ambulatory Visit (INDEPENDENT_AMBULATORY_CARE_PROVIDER_SITE_OTHER): Payer: Medicare HMO | Admitting: Family Medicine

## 2015-01-05 ENCOUNTER — Telehealth: Payer: Self-pay | Admitting: *Deleted

## 2015-01-05 ENCOUNTER — Encounter: Payer: Self-pay | Admitting: Family Medicine

## 2015-01-05 VITALS — BP 144/62 | HR 72 | Resp 18 | Wt 155.0 lb

## 2015-01-05 DIAGNOSIS — N410 Acute prostatitis: Secondary | ICD-10-CM | POA: Diagnosis not present

## 2015-01-05 DIAGNOSIS — R3915 Urgency of urination: Secondary | ICD-10-CM

## 2015-01-05 DIAGNOSIS — R5383 Other fatigue: Secondary | ICD-10-CM

## 2015-01-05 LAB — POCT URINALYSIS DIPSTICK
Glucose, UA: NEGATIVE
NITRITE UA: NEGATIVE
PH UA: 6.5
Spec Grav, UA: 1.02
UROBILINOGEN UA: 0.2

## 2015-01-05 LAB — IFOBT (OCCULT BLOOD): IMMUNOLOGICAL FECAL OCCULT BLOOD TEST: NEGATIVE

## 2015-01-05 MED ORDER — DOXYCYCLINE HYCLATE 100 MG PO TABS: 100.0000 mg | ORAL_TABLET | Freq: Two times a day (BID) | ORAL | Status: DC

## 2015-01-05 NOTE — Progress Notes (Signed)
Patient ID: KAVIN WECKWERTH, male   DOB: 14-Jun-1932, 81 y.o.   MRN: [id number]       Patient: YOCHANAN EDDLEMAN Male    DOB: 10/21/1932   81 y.o.   MRN: [id number] Visit Date: 01/05/2015  Today's Provider: [name], MD   Chief Complaint  Patient presents with  . Fatigue    x 1 week.    Subjective:    HPI Fatigue Pateint reports that he has been feeling fatigued for the past week or so. Patient reports that he had trouble urinating, and feels bloated off and on. Patient denies any pain with urinating. He denies any fever. Patient reports that he just feels "run down". Patient reports that he has not been taking anything OTC for symptoms.    he has no specific symptoms such as fevers headaches abdominal pain chest pain shortness of breath.  No Known Allergies Previous Medications   ACETAMINOPHEN (TYLENOL) 500 MG TABLET    Take 500 mg by mouth every 6 (six) hours as needed.   ASPIRIN 81 MG TABLET    Take by mouth daily.   CHOLECALCIFEROL (VITAMIN D3) 1000 UNITS CAPS    Take 1 capsule by mouth daily.   HYDROCHLOROTHIAZIDE (HYDRODIURIL) 25 MG TABLET    Take by mouth daily.   LOVASTATIN (MEVACOR) 40 MG TABLET    Take 1 tablet (40 mg total) by mouth at bedtime.   METOPROLOL (LOPRESSOR) 50 MG TABLET    Take 1 tablet (50 mg total) by mouth 2 (two) times daily.   OMEPRAZOLE (PRILOSEC) 40 MG CAPSULE    Take 40 mg by mouth daily.     Review of Systems  Constitutional: Positive for fatigue.  HENT: Negative.   Eyes: Negative.   Respiratory: Positive for shortness of breath.   Cardiovascular: Negative.   Gastrointestinal: Positive for abdominal pain.       Feels "bloated".   Endocrine: Negative.   Genitourinary: Positive for frequency.  Allergic/Immunologic: Negative.   Neurological: Negative.   Hematological: Negative.   Psychiatric/Behavioral: Negative.     Social History  Substance Use Topics  . Smoking status: Former Smoker -- 1.00 packs/day for 15 years    Types:  Cigarettes    Quit date: 04/11/1997  . Smokeless tobacco: Never Used  . Alcohol Use: No   Objective:   There were no vitals taken for this visit.  Physical Exam  Constitutional: He is oriented to person, place, and time. He appears well-developed and well-nourished.  Incredibly pleasant, frail black male gentleman in no acute distress. He looks chronically ill.  HENT:  Head: Normocephalic and atraumatic.  Right Ear: External ear normal.  Left Ear: External ear normal.  Nose: Nose normal.  Mouth/Throat: Oropharyngeal exudate present.  Eyes: Conjunctivae are normal. Scleral icterus is present.  Neck: Neck supple.  Cardiovascular: Normal rate, regular rhythm and normal heart sounds.   Pulmonary/Chest: Effort normal.  Abdominal: Soft.  Neurological: He is alert and oriented to person, place, and time.  Skin: Skin is warm and dry.  Psychiatric: He has a normal mood and affect. His behavior is normal. Judgment and thought content normal.        Assessment & Plan:     Malaise and fatigue in this pleasant gentleman I am very concerned because of his parents today. His color is not good and he has has slight jaundice. We'll draw lab work. Encouraged hydration. Follow-up in a couple days. Acute prostatitis He has an abnormal urine and his  prostate is joint to exam. This could explain his symptoms but not all physical findings. At this time we'll treat with doxycycline 100 mg twice a day and follow-up. Again encouraged fluid Known squamous cell carcinoma of the tonsil Followed at [facility] ASCVD Risk factors treated History of controlled type 2 diabetes mellitus    I have done the exam and reviewed the above chart and it is accurate to the best of my knowledge.   Richard Cranford Mon, MD  [facility]

## 2015-01-05 NOTE — Telephone Encounter (Signed)
Called to report that he is lying around and doesn't feel well. "Thinks it is his kidneys again" drinking a lot of water, but not really passing much urine, denies burning on urination. Advised her to call Dr Rosanna Randy, [place], She said she would call them

## 2015-01-06 LAB — URINE CULTURE: ORGANISM ID, BACTERIA: NO GROWTH

## 2015-01-07 ENCOUNTER — Ambulatory Visit (INDEPENDENT_AMBULATORY_CARE_PROVIDER_SITE_OTHER): Payer: Medicare HMO | Admitting: Family Medicine

## 2015-01-07 ENCOUNTER — Encounter: Payer: Self-pay | Admitting: Emergency Medicine

## 2015-01-07 ENCOUNTER — Inpatient Hospital Stay
Admission: EM | Admit: 2015-01-07 | Discharge: 2015-01-09 | DRG: 435 | Disposition: A | Discharge status: Other Acute Inpatient Hospital | Payer: Medicare HMO | Attending: Internal Medicine | Admitting: Internal Medicine

## 2015-01-07 ENCOUNTER — Emergency Department: Payer: Medicare HMO

## 2015-01-07 ENCOUNTER — Encounter: Payer: Self-pay | Admitting: Family Medicine

## 2015-01-07 ENCOUNTER — Telehealth: Payer: Self-pay

## 2015-01-07 ENCOUNTER — Inpatient Hospital Stay: Payer: Medicare HMO

## 2015-01-07 VITALS — BP 120/60 | HR 67 | Temp 96.9°F | Resp 16 | Wt 153.6 lb

## 2015-01-07 DIAGNOSIS — D638 Anemia in other chronic diseases classified elsewhere: Secondary | ICD-10-CM | POA: Diagnosis present

## 2015-01-07 DIAGNOSIS — E119 Type 2 diabetes mellitus without complications: Secondary | ICD-10-CM | POA: Diagnosis present

## 2015-01-07 DIAGNOSIS — I2510 Atherosclerotic heart disease of native coronary artery without angina pectoris: Secondary | ICD-10-CM | POA: Diagnosis present

## 2015-01-07 DIAGNOSIS — I714 Abdominal aortic aneurysm, without rupture: Secondary | ICD-10-CM | POA: Diagnosis present

## 2015-01-07 DIAGNOSIS — E222 Syndrome of inappropriate secretion of antidiuretic hormone: Secondary | ICD-10-CM | POA: Diagnosis present

## 2015-01-07 DIAGNOSIS — K838 Other specified diseases of biliary tract: Secondary | ICD-10-CM

## 2015-01-07 DIAGNOSIS — Z79899 Other long term (current) drug therapy: Secondary | ICD-10-CM | POA: Diagnosis not present

## 2015-01-07 DIAGNOSIS — Z794 Long term (current) use of insulin: Secondary | ICD-10-CM

## 2015-01-07 DIAGNOSIS — K869 Disease of pancreas, unspecified: Secondary | ICD-10-CM

## 2015-01-07 DIAGNOSIS — R630 Anorexia: Secondary | ICD-10-CM | POA: Diagnosis not present

## 2015-01-07 DIAGNOSIS — C099 Malignant neoplasm of tonsil, unspecified: Secondary | ICD-10-CM | POA: Diagnosis present

## 2015-01-07 DIAGNOSIS — E780 Pure hypercholesterolemia: Secondary | ICD-10-CM | POA: Diagnosis present

## 2015-01-07 DIAGNOSIS — Z85818 Personal history of malignant neoplasm of other sites of lip, oral cavity, and pharynx: Secondary | ICD-10-CM | POA: Diagnosis not present

## 2015-01-07 DIAGNOSIS — K831 Obstruction of bile duct: Secondary | ICD-10-CM

## 2015-01-07 DIAGNOSIS — Z8589 Personal history of malignant neoplasm of other organs and systems: Secondary | ICD-10-CM | POA: Diagnosis not present

## 2015-01-07 DIAGNOSIS — E871 Hypo-osmolality and hyponatremia: Secondary | ICD-10-CM | POA: Diagnosis present

## 2015-01-07 DIAGNOSIS — I10 Essential (primary) hypertension: Secondary | ICD-10-CM | POA: Diagnosis present

## 2015-01-07 DIAGNOSIS — R1909 Other intra-abdominal and pelvic swelling, mass and lump: Secondary | ICD-10-CM | POA: Diagnosis not present

## 2015-01-07 DIAGNOSIS — R17 Unspecified jaundice: Secondary | ICD-10-CM

## 2015-01-07 DIAGNOSIS — Z7982 Long term (current) use of aspirin: Secondary | ICD-10-CM | POA: Diagnosis not present

## 2015-01-07 DIAGNOSIS — C259 Malignant neoplasm of pancreas, unspecified: Secondary | ICD-10-CM | POA: Diagnosis not present

## 2015-01-07 DIAGNOSIS — Z87891 Personal history of nicotine dependence: Secondary | ICD-10-CM

## 2015-01-07 DIAGNOSIS — K8689 Other specified diseases of pancreas: Secondary | ICD-10-CM

## 2015-01-07 DIAGNOSIS — E559 Vitamin D deficiency, unspecified: Secondary | ICD-10-CM | POA: Diagnosis present

## 2015-01-07 DIAGNOSIS — E785 Hyperlipidemia, unspecified: Secondary | ICD-10-CM | POA: Diagnosis present

## 2015-01-07 DIAGNOSIS — R531 Weakness: Secondary | ICD-10-CM | POA: Diagnosis not present

## 2015-01-07 LAB — CBC WITH DIFFERENTIAL/PLATELET
BAND NEUTROPHILS: 1 %
BASOS ABS: 0 10*3/uL (ref 0–0.1)
BASOS PCT: 0 %
Basophils Absolute: 0 10*3/uL (ref 0.0–0.2)
Basos: 0 %
Blasts: 0 %
EOS (ABSOLUTE): 0.3 10*3/uL (ref 0.0–0.4)
EOS ABS: 0 10*3/uL (ref 0–0.7)
EOS PCT: 0 %
EOS: 3 %
HCT: 29.5 % — ABNORMAL LOW (ref 40.0–52.0)
HEMATOCRIT: 31.1 % — AB (ref 37.5–51.0)
Hemoglobin: 10.7 g/dL — ABNORMAL LOW (ref 12.6–17.7)
Hemoglobin: 10.1 g/dL — ABNORMAL LOW (ref 13.0–18.0)
Immature Grans (Abs): 0.1 10*3/uL (ref 0.0–0.1)
Immature Granulocytes: 1 %
LYMPHS ABS: 0.8 10*3/uL (ref 0.7–3.1)
LYMPHS ABS: 0.6 10*3/uL — AB (ref 1.0–3.6)
Lymphocytes Relative: 7 %
Lymphs: 9 %
MCH: 28.3 pg (ref 26.6–33.0)
MCH: 28.4 pg (ref 26.0–34.0)
MCHC: 34.4 g/dL (ref 31.5–35.7)
MCHC: 34.3 g/dL (ref 32.0–36.0)
MCV: 82 fL (ref 79–97)
MCV: 82.7 fL (ref 80.0–100.0)
METAMYELOCYTES PCT: 0 %
MONO ABS: 0.7 10*3/uL (ref 0.2–1.0)
MONOS ABS: 1 10*3/uL — AB (ref 0.1–0.9)
MONOS PCT: 8 %
MYELOCYTES: 0 %
Monocytes: 11 %
NEUTROS ABS: 6.9 10*3/uL — AB (ref 1.4–6.5)
NEUTROS PCT: 76 %
Neutrophils Absolute: 7 10*3/uL (ref 1.4–7.0)
Neutrophils Relative %: 84 %
Other: 0 %
PLATELETS: 510 10*3/uL — AB (ref 150–379)
PLATELETS: 471 10*3/uL — AB (ref 150–440)
Promyelocytes Absolute: 0 %
RBC: 3.78 x10E6/uL — AB (ref 4.14–5.80)
RBC: 3.56 MIL/uL — AB (ref 4.40–5.90)
RDW: 16.5 % — AB (ref 12.3–15.4)
RDW: 15.1 % — AB (ref 11.5–14.5)
WBC: 9.2 10*3/uL (ref 3.4–10.8)
WBC: 8.2 10*3/uL (ref 3.8–10.6)
nRBC: 0 /100 WBC

## 2015-01-07 LAB — COMPREHENSIVE METABOLIC PANEL
A/G RATIO: 1.1 (ref 1.1–2.5)
ALBUMIN: 3 g/dL — AB (ref 3.5–4.8)
ALK PHOS: 978 IU/L — AB (ref 39–117)
ALK PHOS: 871 U/L — AB (ref 38–126)
ALT: 168 IU/L — ABNORMAL HIGH (ref 0–44)
ALT: 149 U/L — AB (ref 17–63)
AST: 199 IU/L — AB (ref 0–40)
AST: 189 U/L — AB (ref 15–41)
Albumin: 2.4 g/dL — ABNORMAL LOW (ref 3.5–5.0)
Anion gap: 10 (ref 5–15)
BILIRUBIN TOTAL: 18.4 mg/dL — AB (ref 0.0–1.2)
BUN / CREAT RATIO: 17 (ref 10–22)
BUN: 21 mg/dL (ref 8–27)
BUN: 25 mg/dL — AB (ref 6–20)
CALCIUM: 8.3 mg/dL — AB (ref 8.9–10.3)
CHLORIDE: 73 mmol/L — AB (ref 97–108)
CHLORIDE: 77 mmol/L — AB (ref 101–111)
CO2: 22 mmol/L (ref 18–29)
CO2: 24 mmol/L (ref 22–32)
CREATININE: 1.23 mg/dL (ref 0.76–1.27)
CREATININE: 0.84 mg/dL (ref 0.61–1.24)
Calcium: 8.8 mg/dL (ref 8.6–10.2)
GFR calc Af Amer: 64 mL/min/{1.73_m2} (ref >59)
GFR calc Af Amer: >60 mL/min (ref >60)
GFR calc non Af Amer: 55 mL/min/{1.73_m2} — ABNORMAL LOW (ref >59)
GFR calc non Af Amer: >60 mL/min (ref >60)
GLOBULIN, TOTAL: 2.8 g/dL (ref 1.5–4.5)
GLUCOSE: 176 mg/dL — AB (ref 65–99)
Glucose: 178 mg/dL — ABNORMAL HIGH (ref 65–99)
POTASSIUM: 5 mmol/L (ref 3.5–5.2)
Potassium: 4.6 mmol/L (ref 3.5–5.1)
SODIUM: 111 mmol/L — AB (ref 134–144)
SODIUM: 111 mmol/L — AB (ref 135–145)
Total Bilirubin: 19.7 mg/dL (ref 0.3–1.2)
Total Protein: 5.8 g/dL — ABNORMAL LOW (ref 6.0–8.5)
Total Protein: 6.4 g/dL — ABNORMAL LOW (ref 6.5–8.1)

## 2015-01-07 LAB — CREATININE, SERUM: Creatinine, Ser: 0.88 mg/dL (ref 0.61–1.24)

## 2015-01-07 LAB — CBC
HEMATOCRIT: 27.6 % — AB (ref 40.0–52.0)
HEMOGLOBIN: 9.8 g/dL — AB (ref 13.0–18.0)
MCH: 29.4 pg (ref 26.0–34.0)
MCHC: 35.4 g/dL (ref 32.0–36.0)
MCV: 83 fL (ref 80.0–100.0)
Platelets: 450 10*3/uL — ABNORMAL HIGH (ref 150–440)
RBC: 3.33 MIL/uL — AB (ref 4.40–5.90)
RDW: 15.3 % — AB (ref 11.5–14.5)
WBC: 8.2 10*3/uL (ref 3.8–10.6)

## 2015-01-07 LAB — TSH: TSH: 4.58 u[IU]/mL — ABNORMAL HIGH (ref 0.450–4.500)

## 2015-01-07 LAB — GLUCOSE, CAPILLARY
GLUCOSE-CAPILLARY: 117 mg/dL — AB (ref 65–99)
GLUCOSE-CAPILLARY: 127 mg/dL — AB (ref 65–99)

## 2015-01-07 LAB — LIPASE, BLOOD: Lipase: 739 U/L — ABNORMAL HIGH (ref 22–51)

## 2015-01-07 LAB — SODIUM
SODIUM: 112 mmol/L — AB (ref 135–145)
SODIUM: 112 mmol/L — AB (ref 135–145)

## 2015-01-07 LAB — HEMOGLOBIN A1C: Hgb A1c MFr Bld: 5.9 % (ref 4.0–6.0)

## 2015-01-07 LAB — BILIRUBIN, DIRECT: Bilirubin, Direct: 12.7 mg/dL — ABNORMAL HIGH (ref 0.1–0.5)

## 2015-01-07 MED ORDER — INSULIN ASPART 100 UNIT/ML ~~LOC~~ SOLN: 0.0000 [IU] | Freq: Three times a day (TID) | SUBCUTANEOUS | Status: DC

## 2015-01-07 MED ORDER — GADOBENATE DIMEGLUMINE 529 MG/ML IV SOLN
15.0000 mL | Freq: Once | INTRAVENOUS | Status: AC | PRN
  Administered 2015-01-07: 13 mL via INTRAVENOUS

## 2015-01-07 MED ORDER — SODIUM CHLORIDE 0.9 % IJ SOLN: 10.0000 mL | INTRAMUSCULAR | Status: DC | PRN

## 2015-01-07 MED ORDER — INSULIN ASPART 100 UNIT/ML ~~LOC~~ SOLN
0.0000 [IU] | Freq: Every day | SUBCUTANEOUS | Status: DC
  Administered 2015-01-08: 23:00:00 2 [IU] via SUBCUTANEOUS
  Filled 2015-01-07: qty 2

## 2015-01-07 MED ORDER — METOPROLOL TARTRATE 50 MG PO TABS
50.0000 mg | ORAL_TABLET | Freq: Two times a day (BID) | ORAL | Status: DC
  Administered 2015-01-07 – 2015-01-09 (×4): 50 mg via ORAL
  Filled 2015-01-07 (×4): qty 1

## 2015-01-07 MED ORDER — HEPARIN SOD (PORK) LOCK FLUSH 100 UNIT/ML IV SOLN: 500.0000 [IU] | Freq: Once | INTRAVENOUS | Status: DC

## 2015-01-07 MED ORDER — ONDANSETRON HCL 4 MG PO TABS: 4.0000 mg | ORAL_TABLET | Freq: Four times a day (QID) | ORAL | Status: DC | PRN

## 2015-01-07 MED ORDER — INSULIN ASPART 100 UNIT/ML ~~LOC~~ SOLN: 3.0000 [IU] | Freq: Three times a day (TID) | SUBCUTANEOUS | Status: DC

## 2015-01-07 MED ORDER — ALTEPLASE 2 MG IJ SOLR
2.0000 mg | Freq: Once | INTRAMUSCULAR | Status: DC | PRN
  Filled 2015-01-07: qty 2

## 2015-01-07 MED ORDER — SODIUM CHLORIDE 0.9 % IV SOLN: INTRAVENOUS | Status: DC

## 2015-01-07 MED ORDER — SODIUM CHLORIDE 3 % IV SOLN
INTRAVENOUS | Status: DC
Start: 2015-01-07 — End: 2015-01-07

## 2015-01-07 MED ORDER — ONDANSETRON HCL 4 MG/2ML IJ SOLN: 4.0000 mg | Freq: Four times a day (QID) | INTRAMUSCULAR | Status: DC | PRN

## 2015-01-07 MED ORDER — ASPIRIN EC 81 MG PO TBEC
81.0000 mg | DELAYED_RELEASE_TABLET | Freq: Every day | ORAL | Status: DC
Start: 2015-01-07 — End: 2015-01-09
  Filled 2015-01-07: qty 1

## 2015-01-07 MED ORDER — HEPARIN SODIUM (PORCINE) 5000 UNIT/ML IJ SOLN
5000.0000 [IU] | Freq: Three times a day (TID) | INTRAMUSCULAR | Status: DC
  Filled 2015-01-07: qty 1

## 2015-01-07 MED ORDER — PANTOPRAZOLE SODIUM 40 MG PO TBEC
40.0000 mg | DELAYED_RELEASE_TABLET | Freq: Every day | ORAL | Status: DC
  Administered 2015-01-07: 18:00:00 40 mg via ORAL
  Filled 2015-01-07: qty 1

## 2015-01-07 NOTE — ED Provider Notes (Addendum)
[facility] Emergency Department Provider Note  Time seen: 1:14 PM  I have reviewed the triage vital signs and the nursing notes.   HISTORY  Chief Complaint Weakness    HPI Geoffrey Gregory is a 81 y.o. male with a past medical history of hyperlipidemia, hypertension, diabetes, AAA status post repair, tonsil cancer (status post remission 2 years) who presents to the emergency department for abnormal labs. According to the patient he has noticed a very dark urine color simian appointment with his primary care physician. He was seen at his primary care physician where they did basic lab work. He was called back today stating that his bilirubin was very elevated and he needed to come to the emergency department for evaluation. Patient has noted today that he is been very itchy, his eyes are yellow and the skin is yellow. He states prior to today he did not notice this. He denies any abdominal pain, nausea or vomiting. Patient has a history of tonsil cancer, but is been off chemotherapy for the past 2 years, and is supposedly in remission per patient.     Past Medical History  Diagnosis Date  . Hyperlipidemia   . 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 Hypertension   . Diabetes mellitus without complication   . CAD (coronary artery disease)   . Vitamin D deficiency   . AAA (abdominal aortic aneurysm)     Patient Active Problem List   Diagnosis Date Noted  . Alcohol dependence in remission 09/17/2014  . Arterial vascular disease 09/17/2014  . Arteriosclerosis of coronary artery 09/17/2014  . DM (diabetes mellitus), secondary 09/17/2014  . Dysphagia, oropharyngeal 09/17/2014  . Impotence of organic origin 09/17/2014  . Abnormal prostate specific antigen 09/17/2014  . Essential (primary) hypertension 09/17/2014  . Hypercholesteremia 09/17/2014  . Adenitis 09/17/2014  . Cancer of tonsil 09/17/2014  . Excessive urination at night 09/17/2014  . Cancer  09/17/2014  . Personal history of tobacco use, presenting hazards to health 09/17/2014  . Avitaminosis D 09/17/2014  . Carotid artery narrowing 01/21/2014  . Acid reflux 01/21/2014    Past Surgical History  Procedure Laterality Date  . Portacath placement  Fe 2015    [name]  . Axillary lymph node biopsy Left 11-28-13    atyical squamous cells  . Carotid stent  1999  . Hernia repair Bilateral 10-06-00    inguinal/ Dr. [name]  . Lymph node dissection Left 12-30-13    axilla    Current Outpatient Rx  Name  Route  Sig  Dispense  Refill  . acetaminophen (TYLENOL) 500 MG tablet   Oral   Take 500 mg by mouth every 6 (six) hours as needed.         Marland Kitchen aspirin 81 MG tablet   Oral   Take by mouth daily.         . Cholecalciferol (VITAMIN D3) 1000 UNITS CAPS   Oral   Take 1 capsule by mouth daily.         Marland Kitchen doxycycline (VIBRA-TABS) 100 MG tablet   Oral   Take 1 tablet (100 mg total) by mouth 2 (two) times daily.   20 tablet   0   . hydrochlorothiazide (HYDRODIURIL) 25 MG tablet   Oral   Take by mouth daily.         Marland Kitchen lovastatin (MEVACOR) 40 MG tablet   Oral   Take 1 tablet (40 mg total) by mouth at bedtime.  30 tablet   6   . metoprolol (LOPRESSOR) 50 MG tablet   Oral   Take 1 tablet (50 mg total) by mouth 2 (two) times daily.   60 tablet   6   . omeprazole (PRILOSEC) 40 MG capsule   Oral   Take 40 mg by mouth daily.            Allergies Review of patient's allergies indicates no known allergies.  Family History  Problem Relation Age of Onset  . Diabetes Mother   . Heart disease Mother   . Stroke Sister   . Kidney disease Sister     kidney failure  . Hypertension Brother     Social History Social History  Substance Use Topics  . Smoking status: Former Smoker -- 1.00 packs/day for 15 years    Types: Cigarettes    Quit date: 04/11/1997  . Smokeless tobacco: Never Used  . Alcohol Use: No    Review of Systems Constitutional: Negative for  fever. Eyes: Yellowing of the eyes. Cardiovascular: Negative for chest pain. Respiratory: Negative for shortness of breath. Gastrointestinal: Negative for abdominal pain Genitourinary: Negative for dysuria. Positive for dark urine. Skin: Yellowing of the skin. Neurological: Negative for headache 10-point ROS otherwise negative.  ____________________________________________   PHYSICAL EXAM:  VITAL SIGNS: ED Triage Vitals  Enc Vitals Group     BP 01/07/15 1124 158/77 mmHg     Pulse Rate 01/07/15 1124 67     Resp 01/07/15 1124 20     Temp 01/07/15 1124 98.2 F (36.8 C)     Temp Source 01/07/15 1124 Oral     SpO2 01/07/15 1124 100 %     Weight 01/07/15 1124 153 lb (69.4 kg)     Height 01/07/15 1124 5\' 5"  (1.651 m)     Head Cir --      Peak Flow --      Pain Score 01/07/15 1125 0     Pain Loc --      Pain Edu? --      Excl. in [place]? --     Constitutional: Alert and oriented. Well appearing and in no distress. Eyes: Normal exam ENT   Mouth/Throat: Mucous membranes are moist. Scleral icterus. Cardiovascular: Normal rate, regular rhythm.  Respiratory: Normal respiratory effort without tachypnea nor retractions. Breath sounds are clear and equal bilaterally. No wheezes/rales/rhonchi. Gastrointestinal: Soft and nontender. No distention.  Musculoskeletal: Nontender with normal range of motion in all extremities. No lower extremity tenderness or edema. Neurologic:  Normal speech and language. No gross focal neurologic deficits are appreciated. Speech is normal. Skin:  Skin is warm, dry. Jaundiced appearance. Psychiatric: Mood and affect are normal. Speech and behavior are normal. Patient exhibits appropriate insight and judgment.  ____________________________________________     RADIOLOGY  Ultrasound consistent with pancreatic mass likely causing obstruction.  ____________________________________________    INITIAL IMPRESSION / ASSESSMENT AND PLAN / ED  COURSE  Pertinent labs & imaging results that were available during my care of the patient were reviewed by me and considered in my medical decision making (see chart for details).  Patient with an elevated bilirubin of 18.5 sent to the emergency department for evaluation. Scleral icterus and jaundice on exam. No abdominal pain/tenderness. Patient has a history of tonsillar cancer, but is supposedly in remission. No right upper quadrant pain. No recent abdominal pain. No association with food. We will repeat labs in the emergency department, and obtain an ultrasound of the right upper quadrant help further  evaluate. Patient is in no distress, is calm and comfortable. States he is itching but denies any other pains or discomforts.  Ultrasound consistent with pancreatic mass which likely causing an obstruction of the biliary ducts. I discussed the findings with the patient and the need for admission for an oncologic workup, and GI intervention. The patient and wife are agreeable. Labs consistent with obstructive processes as well as hyponatremia. Patient admits possible for further workup and evaluation. ____________________________________________   FINAL CLINICAL IMPRESSION(S) / ED DIAGNOSES  Hyperbilirubinemia Pancreatic mass Hyponatremia  Harvest Dark, MD 01/07/15 1448  Harvest Dark, MD 01/07/15 [phone]

## 2015-01-07 NOTE — ED Notes (Signed)
Pt reports that he hasnt been feeling well for a week.  States that he went to his PCP and had bloodwork done yesterday.  PCP called pt today and was told to come to the ED for further eval of elevated bili and decreased sodium.  Pt appears very jaundice and is reporting itching.  Pt denies pain at this time.  Pt A/O x 4.  No acute distress noted.  Family at bedside

## 2015-01-07 NOTE — Progress Notes (Signed)
       Patient: Geoffrey Gregory Male    DOB: 1932/12/28   81 y.o.   MRN: [id number] Visit Date: 01/07/2015  Today's Daquisha Clermont: [name], MD   Chief Complaint  Patient presents with  . Labs Only    Here to review labs   Subjective:    HPI Patient is here to discuss labs.    No Known Allergies Previous Medications   ACETAMINOPHEN (TYLENOL) 500 MG TABLET    Take 500 mg by mouth every 6 (six) hours as needed.   ASPIRIN 81 MG TABLET    Take by mouth daily.   CHOLECALCIFEROL (VITAMIN D3) 1000 UNITS CAPS    Take 1 capsule by mouth daily.   DOXYCYCLINE (VIBRA-TABS) 100 MG TABLET    Take 1 tablet (100 mg total) by mouth 2 (two) times daily.   HYDROCHLOROTHIAZIDE (HYDRODIURIL) 25 MG TABLET    Take by mouth daily.   LOVASTATIN (MEVACOR) 40 MG TABLET    Take 1 tablet (40 mg total) by mouth at bedtime.   METOPROLOL (LOPRESSOR) 50 MG TABLET    Take 1 tablet (50 mg total) by mouth 2 (two) times daily.   OMEPRAZOLE (PRILOSEC) 40 MG CAPSULE    Take 40 mg by mouth daily.     Review of Systems  Constitutional: Positive for fatigue.  HENT: Negative.   Eyes: Negative.   Respiratory: Negative.   Cardiovascular: Negative.   Gastrointestinal: Positive for abdominal distention.  Endocrine: Negative.   Genitourinary: Positive for difficulty urinating.  Allergic/Immunologic: Negative.   Neurological: Negative.   Hematological: Negative.   Psychiatric/Behavioral: Negative.     Social History  Substance Use Topics  . Smoking status: Former Smoker -- 1.00 packs/day for 15 years    Types: Cigarettes    Quit date: 04/11/1997  . Smokeless tobacco: Never Used  . Alcohol Use: No   Objective:   BP 120/60 mmHg  Pulse 67  Temp(Src) 96.9 F (36.1 C) (Axillary)  Resp 16  Wt 153 lb 9.6 oz (69.673 kg)  SpO2 95%  Physical Exam  Constitutional: He is oriented to person, place, and time. He appears well-developed and well-nourished.  Thin black male in no acute distress. He does not look  like he feels well.  HENT:  Head: Normocephalic and atraumatic.  Right Ear: External ear normal.  Left Ear: External ear normal.  Nose: Nose normal.  Eyes: Conjunctivae are normal. Scleral icterus is present.  Neck: Neck supple.  Cardiovascular: Normal rate, regular rhythm and normal heart sounds.   Pulmonary/Chest: Effort normal and breath sounds normal.  Abdominal: Soft. He exhibits distension.  Mild abdominal distention without tenderness or mass  Neurological: He is alert and oriented to person, place, and time.  Skin: Skin is warm and dry.  Psychiatric: He has a normal mood and affect. His behavior is normal. Judgment and thought content normal.        Assessment & Plan:     Severe hyponatremia Biliary Obstruction    Pt to ED at [facility] for almost certain admission.  Richard Cranford Mon, MD  [facility]

## 2015-01-07 NOTE — ED Notes (Addendum)
Pt to ed with c/o weakness and elevated billirubin, and sodium of 111.  Pt skin yellow in color.

## 2015-01-07 NOTE — Plan of Care (Signed)
Problem: Discharge Progression Outcomes Goal: Discharge plan in place and appropriate Individualization of Care Outcome: Progressing 1. Lives at home with wife 2. Likes to be called Geoffrey Gregory 3. Hx of CAD, hyperlipidemia, DM, Tonsil cancer, AAA s/p repair, and HTn controlled by home meds.  Goal: Other Discharge Outcomes/Goals Outcome: Progressing Plan of care progress to goal for: 1. Pain-no c/o pain this shift 2. Hemodynamically-             -VSS, pt remains afebrile this shift             -IV fluids continue per orders 3. Complications-no evidence of this shift 4. Diet-pt tolerating diet this shift 5. Activity-pt up to the BR with assistance

## 2015-01-07 NOTE — H&P (Addendum)
[facility] [place] NAME: Geoffrey Gregory    MR#:  [id number]  DATE OF BIRTH:  [birth year]  DATE OF ADMISSION:  01/07/2015  PRIMARY CARE PHYSICIAN: [name], MD   REQUESTING/REFERRING PHYSICIAN:   CHIEF COMPLAINT:   Chief Complaint  Patient presents with  . Weakness    HISTORY OF PRESENT ILLNESS: Geoffrey Gregory  is a 81 y.o. male with a known history of multiple medical problems including coronary artery disease, diabetes, hypertension, hyperlipidemia, tonsillar cancer who underwent a chemotherapy and radiation therapy as recommended by Dr. Ramond Craver approximately 2 years ago, presents to the hospital with dark urine. According to the patient is been noticing dark urine for the past one week. He denies any stool color change, but admits of significant weakness and itching since approximately 2 or 3 days ago. On arrival to the hospital. He was noted to have elevated total and direct bilirubin and ultrasound of abdomen was performed which revealed a mass in pancreatic head area. Hospitalist services were contacted for admission. Patient denies any significant pains but admits of poor appetite recently and weakness  PAST MEDICAL HISTORY:   Past Medical History  Diagnosis Date  . Hyperlipidemia   . 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 Hypertension   . Diabetes mellitus without complication   . CAD (coronary artery disease)   . Vitamin D deficiency   . AAA (abdominal aortic aneurysm)     PAST SURGICAL HISTORY:  Past Surgical History  Procedure Laterality Date  . Portacath placement  Fe 2015    [name]  . Axillary lymph node biopsy Left 11-28-13    atyical squamous cells  . Carotid stent  1999  . Hernia repair Bilateral 10-06-00    inguinal/ Dr. [name]  . Lymph node dissection Left 12-30-13    axilla    SOCIAL HISTORY:  Social History  Substance Use Topics  . Smoking status: Former Smoker -- 1.00 packs/day for 15  years    Types: Cigarettes    Quit date: 04/11/1997  . Smokeless tobacco: Never Used  . Alcohol Use: No    FAMILY HISTORY:  Family History  Problem Relation Age of Onset  . Diabetes Mother   . Heart disease Mother   . Stroke Sister   . Kidney disease Sister     kidney failure  . Hypertension Brother     DRUG ALLERGIES: No Known Allergies  Review of Systems  Constitutional: Negative for fever, chills, weight loss and malaise/fatigue.  HENT: Negative for congestion.   Eyes: Negative for blurred vision and double vision.  Respiratory: Negative for cough, sputum production, shortness of breath and wheezing.   Cardiovascular: Negative for chest pain, palpitations, orthopnea, leg swelling and PND.  Gastrointestinal: Negative for nausea, vomiting, abdominal pain, diarrhea, constipation, blood in stool and melena.  Genitourinary: Negative for dysuria, urgency, frequency and hematuria.  Musculoskeletal: Negative for falls.  Skin: Negative for rash.  Neurological: Positive for weakness. Negative for dizziness.  Psychiatric/Behavioral: Negative for depression and memory loss. The patient is not nervous/anxious.     MEDICATIONS AT HOME:  Prior to Admission medications   Medication Sig Start Date End Date Taking? Authorizing [name]  acetaminophen (TYLENOL) 500 MG tablet Take 500 mg by mouth every 6 (six) hours as needed.   Yes Historical [name], MD  aspirin 81 MG tablet Take by mouth daily. 06/08/11  Yes Historical [name], MD  Cholecalciferol (VITAMIN D3) 1000 UNITS  CAPS Take 1 capsule by mouth daily.   Yes Historical [name], MD  doxycycline (VIBRA-TABS) 100 MG tablet Take 1 tablet (100 mg total) by mouth 2 (two) times daily. 01/05/15  Yes [name], MD  hydrochlorothiazide (HYDRODIURIL) 25 MG tablet Take by mouth daily. 04/16/14  Yes Historical [name], MD  lovastatin (MEVACOR) 40 MG tablet Take 1 tablet (40 mg total) by mouth at bedtime. 10/17/14  Yes [name], MD  metoprolol (LOPRESSOR) 50 MG tablet Take 1 tablet (50 mg total) by mouth 2 (two) times daily. 10/17/14  Yes [name], MD  omeprazole (PRILOSEC) 40 MG capsule Take 40 mg by mouth daily.  12/11/13  Yes Historical [name], MD      PHYSICAL EXAMINATION:   VITAL SIGNS: Blood pressure 158/77, pulse 67, temperature 98.2 F (36.8 C), temperature source Oral, resp. rate 20, height 5\' 5"  (1.651 m), weight 69.4 kg (153 lb), SpO2 100 %.  GENERAL:  81 y.o.-year-old patient lying in the bed with no acute distress.  EYES: Pupils equal, round, reactive to light and accommodation. No scleral icterus. Extraocular muscles intact.  HEENT: Head atraumatic, normocephalic. Oropharynx and nasopharynx clear.  NECK:  Supple, no jugular venous distention. No thyroid enlargement, no tenderness.  LUNGS: Normal breath sounds bilaterally, no wheezing, rales,rhonchi or crepitation. No use of accessory muscles of respiration.  CARDIOVASCULAR: S1, S2 normal. No murmurs, rubs, or gallops.  ABDOMEN: Soft, nontender, nondistended. Bowel sounds present. No organomegaly or mass.  EXTREMITIES: No pedal edema, cyanosis, or clubbing.  NEUROLOGIC: Cranial nerves II through XII are intact. Muscle strength 5/5 in all extremities. Sensation intact. Gait not checked.  PSYCHIATRIC: The patient is alert and oriented x 3.  SKIN: No obvious rash, lesion, or ulcer.   LABORATORY PANEL:   CBC  Recent Labs Lab 01/06/15 1033 01/07/15 1149  WBC 9.2 8.2  HGB  --  10.1*  HCT 31.1* 29.5*  PLT  --  471*  MCV  --  82.7  MCH 28.3 28.4  MCHC 34.4 34.3  RDW 16.5* 15.1*  LYMPHSABS 0.8 0.6*  MONOABS  --  0.7  EOSABS  --  0.0  BASOSABS 0.0 0.0   ------------------------------------------------------------------------------------------------------------------  Chemistries   Recent Labs Lab 01/07/15 1149  NA 111*  K 4.6  CL 77*  CO2 24  GLUCOSE 176*  BUN 25*  CREATININE 0.84  CALCIUM 8.3*  AST 189*  ALT  149*  ALKPHOS 871*  BILITOT 19.7*   ------------------------------------------------------------------------------------------------------------------  Cardiac Enzymes No results for input(s): TROPONINI in the last 168 hours. ------------------------------------------------------------------------------------------------------------------  RADIOLOGY: US Abdomen Limited Ruq  01/07/2015   CLINICAL DATA:  Elevated liver function studies, history of diabetes, alcohol dependence in remission, coronary artery disease.  EXAM: US ABDOMEN LIMITED - RIGHT UPPER QUADRANT  COMPARISON:  Abdominal pelvic CT scan of December 20, 2013  FINDINGS: Gallbladder:  The gallbladder is adequately distended. There is echogenic bile versus tiny floating stones within the gallbladder lumen. There is no gallbladder wall thickening, pericholecystic fluid, or positive sonographic Murphy's sign.  Common bile duct:  Diameter: 10 mm  Liver:  The intrahepatic ducts are dilated. No discrete hepatic mass is observed. There is a hypoechoic mass associated with the inferior aspect of the pancreatic head. There is ascites.  IMPRESSION: Suspicious masslike lesion in the region of the pancreatic head resulting in biliary ductal dilation. Possible tiny gallstones versus echogenic bile.  Abdominal MRI or CT scanning is recommended.   Electronically Signed   By:  David  Martinique M.D.   On: 01/07/2015 14:03    EKG: Orders placed or performed in visit on 05/08/13  . EKG 12-Lead    IMPRESSION AND PLAN:  Active Problems:   Obstructive jaundice 1. Obstructive jaundice with ultrasound revealing pancreatic head mass, get gastroenterologist involved for further recommendations. Get MRI, MRCP of liver and pancreas 2. Hypertension. Continue outpatient medications. No changes 3. Diabetes mellitus. Continue patient on diabetic diet and sliding scale insulin 4. Hyponatremia, severe, discontinue HCTZ , patient is relatively asymptomatic except of  generalized weakness, continue IV fluids for now. Following sodium level every 4 hours. Initiate patient on 3% sodium solution. If sodium level does not improve on IV fluid administration 5. Generalized weakness. Physical therapist involved for further recommendations. Possible rehabilitation placement   All the records are reviewed and case discussed with ED Nathaniel Yaden. Management plans discussed with the patient, family and they are in agreement.  CODE STATUS:  Full code  TOTAL TIME TAKING CARE OF THIS PATIENT: 50 minutes.    Theodoro Grist M.D on 01/07/2015 at 3:07 PM  Between 7am to 6pm - Pager - [phone] After 6pm go to [email or web address] - password [email or web address] [facility] [place] [facility]  Office  [phone]  CC: Primary care physician; [name], MD

## 2015-01-07 NOTE — Consult Note (Signed)
[facility]  [address] [place], [place] [address] Phone: [phone] Fax : [phone]  Consultation  Referring Braxston Quinter:     No ref. Elyshia Kumagai found Primary Care Physician:  [name], MD Primary Gastroenterologist:           Reason for Consultation:     Obstructive jaundice  Date of Admission:  01/07/2015 Date of Consultation:  01/07/2015         HPI:   Geoffrey Gregory is a 81 y.o. 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 The patient has been followed by oncology and underwent chemotherapy and radiation. The patient has been noticing dark urine for 1 week. He also has been feeling very tired and was brought to the emergency room for this weakness. The patient also states he has been having itching. On admission to the hospital the patient was found to have a mass in the pancreatic area which was compressing the common bile duct. The patient's bilirubin was also noted to be high at 19. The patient denies any nausea vomiting fevers chills black stools or bloody stools. The patient was then sent for an MRI which she had just come back from when I saw him today.  Past Medical History  Diagnosis Date  . Hyperlipidemia   . 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 Hypertension   . Diabetes mellitus without complication   . CAD (coronary artery disease)   . Vitamin D deficiency   . AAA (abdominal aortic aneurysm)     Past Surgical History  Procedure Laterality Date  . Portacath placement  Fe 2015    [name]  . Axillary lymph node biopsy Left 11-28-13    atyical squamous cells  . Carotid stent  1999  . Hernia repair Bilateral 10-06-00    inguinal/ Dr. [name]  . Lymph node dissection Left 12-30-13    axilla    Prior to Admission medications   Medication Sig Start Date End Date Taking? Authorizing Bobbyjo Marulanda  acetaminophen (TYLENOL) 500 MG tablet Take 500 mg by mouth every 6 (six) hours as needed.   Yes Historical Karion Cudd, MD    aspirin 81 MG tablet Take by mouth daily. 06/08/11  Yes Historical Kamirah Shugrue, MD  Cholecalciferol (VITAMIN D3) 1000 UNITS CAPS Take 1 capsule by mouth daily.   Yes Historical Cypress Fanfan, MD  doxycycline (VIBRA-TABS) 100 MG tablet Take 1 tablet (100 mg total) by mouth 2 (two) times daily. 01/05/15  Yes [name], MD  hydrochlorothiazide (HYDRODIURIL) 25 MG tablet Take by mouth daily. 04/16/14  Yes Historical Guillermo Difrancesco, MD  lovastatin (MEVACOR) 40 MG tablet Take 1 tablet (40 mg total) by mouth at bedtime. 10/17/14  Yes [name], MD  metoprolol (LOPRESSOR) 50 MG tablet Take 1 tablet (50 mg total) by mouth 2 (two) times daily. 10/17/14  Yes [name], MD  omeprazole (PRILOSEC) 40 MG capsule Take 40 mg by mouth daily.  12/11/13  Yes Historical Nianna Igo, MD    Family History  Problem Relation Age of Onset  . Diabetes Mother   . Heart disease Mother   . Stroke Sister   . Kidney disease Sister     kidney failure  . Hypertension Brother      Social History  Substance Use Topics  . Smoking status: Former Smoker -- 1.00 packs/day for 15 years    Types: Cigarettes    Quit date: 04/11/1997  . Smokeless tobacco:  Never Used  . Alcohol Use: No    Allergies as of 01-21-15  . (No Known Allergies)    Review of Systems:    All systems reviewed and negative except where noted in HPI.   Physical Exam:  Vital signs in last 24 hours: Temp:  [96.9 F (36.1 C)-98.2 F (36.8 C)] 97.7 F (36.5 C) 2023-01-21 1543) Pulse Rate:  [58-67] 64 21-Jan-2023 1543) Resp:  [15-20] 18 01/21/2023 1543) BP: (120-158)/(60-77) 154/74 mmHg 01-21-23 1543) SpO2:  [95 %-100 %] 100 % 2023-01-21 1543) Weight:  [145 lb (65.772 kg)-153 lb 9.6 oz (69.673 kg)] 145 lb (65.772 kg) Jan 21, 2023 1543)   General:   Pleasant, cooperative in NAD jaundice Head:  Normocephalic and atraumatic. Eyes:   Positive icterus.   Conjunctiva yellow. PERRLA. Ears:  Normal auditory acuity. Neck:  Supple; no masses or  thyroidomegaly Lungs: Respirations even and unlabored. Lungs clear to auscultation bilaterally.   No wheezes, crackles, or rhonchi.  Heart:  Regular rate and rhythm;  Without murmur, clicks, rubs or gallops Abdomen:  Soft, nondistended, nontender. Normal bowel sounds. No appreciable masses or hepatomegaly.  No rebound or guarding.  Rectal:  Not performed. Msk:  Symmetrical without gross deformities.  Strength  Extremities:  Without edema, cyanosis or clubbing. Neurologic:  Alert and oriented x3;  grossly normal neurologically. Skin:  Intact without significant lesions or rashes. icteric Cervical Nodes:  No significant cervical adenopathy. Psych:  Alert and cooperative. Normal affect.  LAB RESULTS:  Recent Labs  01/06/15 1033 01-21-15 1149  WBC 9.2 8.2  HGB  --  10.1*  HCT 31.1* 29.5*  PLT  --  471*   BMET  Recent Labs  01/06/15 1033 01/21/15 1149  NA 111* 111*  K 5.0 4.6  CL 73* 77*  CO2 22 24  GLUCOSE 178* 176*  BUN 21 25*  CREATININE 1.23 0.84  CALCIUM 8.8 8.3*   LFT  Recent Labs  21-Jan-2015 1149  PROT 6.4*  ALBUMIN 2.4*  AST 189*  ALT 149*  ALKPHOS 871*  BILITOT 19.7*  BILIDIR 12.7*   PT/INR No results for input(s): LABPROT, INR in the last 72 hours.  STUDIES: US Abdomen Limited Ruq  Jan 21, 2015   CLINICAL DATA:  Elevated liver function studies, history of diabetes, alcohol dependence in remission, coronary artery disease.  EXAM: US ABDOMEN LIMITED - RIGHT UPPER QUADRANT  COMPARISON:  Abdominal pelvic CT scan of December 20, 2013  FINDINGS: Gallbladder:  The gallbladder is adequately distended. There is echogenic bile versus tiny floating stones within the gallbladder lumen. There is no gallbladder wall thickening, pericholecystic fluid, or positive sonographic Murphy's sign.  Common bile duct:  Diameter: 10 mm  Liver:  The intrahepatic ducts are dilated. No discrete hepatic mass is observed. There is a hypoechoic mass associated with the inferior aspect of  the pancreatic head. There is ascites.  IMPRESSION: Suspicious masslike lesion in the region of the pancreatic head resulting in biliary ductal dilation. Possible tiny gallstones versus echogenic bile.  Abdominal MRI or CT scanning is recommended.   Electronically Signed   By: David  Martinique M.D.   On: Jan 21, 2015 14:03      Impression / Plan:   Geoffrey Gregory is a 81 y.o. y/o male with who comes today with painless jaundice and a mass found on an ultrasound. The patient's bilirubin is elevated at 19. The patient will be set up for an ERCP for tomorrow. The patient underwent a MRI of the abdomen today. The patient and his  family have been explained the high likelihood that this is pancreatic cancer. He will also have his blood sent off for CA 19-9. I have discussed risks & benefits which include, but are not limited to, bleeding, infection, perforation & drug reaction.  The patient agrees with this plan & written consent will be obtained.     Thank you for involving me in the care of this patient.      LOS: 0 days   Ollen Bowl, MD  01/07/2015, 5:47 PM   Note: This dictation was prepared with Dragon dictation along with smaller phrase technology. Any transcriptional errors that result from this process are unintentional.

## 2015-01-07 NOTE — Telephone Encounter (Signed)
Louie Casa, RN from the [facility] [employer] triage service called regarding Kendric Sindelar with a critical lab. Sodium is at 111 four weeks ago was 133, Total Bili is 18.4 four weeks ago was 0.5, and chloride is 73. Per Louie Casa Dr. Manuella Ghazi was on call last night and recommended for patient to go the ED but nurse have not be able to reach patient.  Please advise.  Thanks,  -Joseline

## 2015-01-07 NOTE — ED Notes (Signed)
Pt returned from ultrasound

## 2015-01-07 NOTE — Telephone Encounter (Signed)
Pt is coming in today to discuss this-aa

## 2015-01-08 ENCOUNTER — Encounter: Payer: Self-pay | Admitting: Certified Registered Nurse Anesthetist

## 2015-01-08 ENCOUNTER — Inpatient Hospital Stay: Payer: Medicare HMO | Admitting: Anesthesiology

## 2015-01-08 ENCOUNTER — Ambulatory Visit: Payer: Medicare HMO | Admitting: Family Medicine

## 2015-01-08 ENCOUNTER — Encounter
Admission: EM | Disposition: A | Discharge status: Other Acute Inpatient Hospital | Payer: Self-pay | Source: Home / Self Care | Attending: Internal Medicine

## 2015-01-08 ENCOUNTER — Encounter: Payer: Self-pay | Admitting: Anesthesiology

## 2015-01-08 DIAGNOSIS — Z85818 Personal history of malignant neoplasm of other sites of lip, oral cavity, and pharynx: Secondary | ICD-10-CM

## 2015-01-08 DIAGNOSIS — R630 Anorexia: Secondary | ICD-10-CM

## 2015-01-08 DIAGNOSIS — R531 Weakness: Secondary | ICD-10-CM

## 2015-01-08 DIAGNOSIS — K831 Obstruction of bile duct: Secondary | ICD-10-CM

## 2015-01-08 DIAGNOSIS — R1909 Other intra-abdominal and pelvic swelling, mass and lump: Secondary | ICD-10-CM

## 2015-01-08 DIAGNOSIS — R109 Unspecified abdominal pain: Secondary | ICD-10-CM

## 2015-01-08 HISTORY — PX: ENDOSCOPIC RETROGRADE CHOLANGIOPANCREATOGRAPHY (ERCP) WITH PROPOFOL: SHX5810

## 2015-01-08 LAB — BASIC METABOLIC PANEL
ANION GAP: 10 (ref 5–15)
BUN: 23 mg/dL — ABNORMAL HIGH (ref 6–20)
CALCIUM: 7.8 mg/dL — AB (ref 8.9–10.3)
CO2: 22 mmol/L (ref 22–32)
Chloride: 81 mmol/L — ABNORMAL LOW (ref 101–111)
Creatinine, Ser: 0.91 mg/dL (ref 0.61–1.24)
GLUCOSE: 97 mg/dL (ref 65–99)
POTASSIUM: 4 mmol/L (ref 3.5–5.1)
Sodium: 113 mmol/L — CL (ref 135–145)

## 2015-01-08 LAB — COMPREHENSIVE METABOLIC PANEL
ALBUMIN: 2.1 g/dL — AB (ref 3.5–5.0)
ALK PHOS: 786 U/L — AB (ref 38–126)
ALT: 130 U/L — ABNORMAL HIGH (ref 17–63)
ANION GAP: 7 (ref 5–15)
AST: 165 U/L — ABNORMAL HIGH (ref 15–41)
BILIRUBIN TOTAL: 18.5 mg/dL — AB (ref 0.3–1.2)
BUN: 24 mg/dL — ABNORMAL HIGH (ref 6–20)
CALCIUM: 7.8 mg/dL — AB (ref 8.9–10.3)
CO2: 23 mmol/L (ref 22–32)
Chloride: 83 mmol/L — ABNORMAL LOW (ref 101–111)
Creatinine, Ser: 0.9 mg/dL (ref 0.61–1.24)
GFR calc Af Amer: >60 mL/min (ref >60)
GLUCOSE: 101 mg/dL — AB (ref 65–99)
POTASSIUM: 4.2 mmol/L (ref 3.5–5.1)
Sodium: 113 mmol/L — CL (ref 135–145)
TOTAL PROTEIN: 5.7 g/dL — AB (ref 6.5–8.1)

## 2015-01-08 LAB — CHLORIDE, URINE, RANDOM: CHLORIDE URINE: 73 mmol/L

## 2015-01-08 LAB — CBC
HCT: 26.3 % — ABNORMAL LOW (ref 40.0–52.0)
HEMOGLOBIN: 8.9 g/dL — AB (ref 13.0–18.0)
MCH: 28.4 pg (ref 26.0–34.0)
MCHC: 34.1 g/dL (ref 32.0–36.0)
MCV: 83.3 fL (ref 80.0–100.0)
Platelets: 427 10*3/uL (ref 150–440)
RBC: 3.15 MIL/uL — ABNORMAL LOW (ref 4.40–5.90)
RDW: 15.4 % — AB (ref 11.5–14.5)
WBC: 7.6 10*3/uL (ref 3.8–10.6)

## 2015-01-08 LAB — GLUCOSE, CAPILLARY
Glucose-Capillary: 107 mg/dL — ABNORMAL HIGH (ref 65–99)
Glucose-Capillary: 104 mg/dL — ABNORMAL HIGH (ref 65–99)
Glucose-Capillary: 240 mg/dL — ABNORMAL HIGH (ref 65–99)

## 2015-01-08 LAB — SODIUM
SODIUM: 123 mmol/L — AB (ref 135–145)
Sodium: 112 mmol/L — CL (ref 135–145)
Sodium: 113 mmol/L — CL (ref 135–145)
Sodium: 115 mmol/L — CL (ref 135–145)

## 2015-01-08 LAB — SODIUM, URINE, RANDOM: SODIUM UR: 75 mmol/L

## 2015-01-08 SURGERY — ENDOSCOPIC RETROGRADE CHOLANGIOPANCREATOGRAPHY (ERCP) WITH PROPOFOL
Anesthesia: General

## 2015-01-08 MED ORDER — PROPOFOL 10 MG/ML IV BOLUS
INTRAVENOUS | Status: DC | PRN
  Administered 2015-01-08: 30 mg via INTRAVENOUS
  Administered 2015-01-08: 50 mg via INTRAVENOUS
  Administered 2015-01-08: 70 mg via INTRAVENOUS

## 2015-01-08 MED ORDER — LIDOCAINE HCL (CARDIAC) 20 MG/ML IV SOLN
INTRAVENOUS | Status: DC | PRN
  Administered 2015-01-08: 60 mg via INTRAVENOUS

## 2015-01-08 MED ORDER — DEXAMETHASONE SODIUM PHOSPHATE 10 MG/ML IJ SOLN
INTRAMUSCULAR | Status: DC | PRN
  Administered 2015-01-08: 20 mg via INTRAVENOUS

## 2015-01-08 MED ORDER — INDOMETHACIN 50 MG RE SUPP
100.0000 mg | Freq: Once | RECTAL | Status: AC
  Administered 2015-01-08: 16:00:00 100 mg via RECTAL
  Filled 2015-01-08: qty 2

## 2015-01-08 MED ORDER — RACEPINEPHRINE HCL 2.25 % IN NEBU
0.5000 mL | INHALATION_SOLUTION | Freq: Once | RESPIRATORY_TRACT | Status: AC
  Administered 2015-01-08: 0.5 mL via RESPIRATORY_TRACT

## 2015-01-08 MED ORDER — NALOXONE HCL 0.4 MG/ML IJ SOLN
INTRAMUSCULAR | Status: DC | PRN
  Administered 2015-01-08: 0.1 mg via INTRAVENOUS

## 2015-01-08 MED ORDER — SODIUM CHLORIDE 3 % IV SOLN
INTRAVENOUS | Status: DC
  Administered 2015-01-08: 02:00:00 25 mL/h via INTRAVENOUS
  Administered 2015-01-08: 40 mL/h via INTRAVENOUS
  Filled 2015-01-08 (×3): qty 500

## 2015-01-08 MED ORDER — FENTANYL CITRATE (PF) 100 MCG/2ML IJ SOLN
INTRAMUSCULAR | Status: DC | PRN
  Administered 2015-01-08: 50 ug via INTRAVENOUS

## 2015-01-08 MED ORDER — SODIUM CHLORIDE 0.9 % IV SOLN
INTRAVENOUS | Status: DC | PRN
  Administered 2015-01-08: 17:00:00 via INTRAVENOUS

## 2015-01-08 NOTE — Consult Note (Signed)
[place] @ [facility] [phone]) [phone]  [phone]   INITIAL CONSULT  STANELY SEXSON OB: 08/12/1932  MR#: [id number]  BUL#:[id number]  Patient Care Team: Jerrol Banana., MD as PCP - General (Family Medicine) Forest Gleason, MD (Unknown Physician Specialty) Christene Lye, MD (General Surgery)  CHIEF COMPLAINT: oncology history this is a 81 year old male referred by [facility]  with newly diagnosed left tonsillar squamous cell cancer.patient had presented to Dr. Rosanna Randy for left-sided throat pain and his wife had noted some mild swelling in his neck.  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  Had been previously treated with oral antibiotics .remote history of smoking that he quit in 1999. ickel history significant for diabetes type 2 elevated PSA coronary artery disease and carotid artery stenosis Patient underwent fiberoptic laryngoscopy on 05/09/2013 with findings of left posterior displacement fullness inferior aspect of the left nasopharynxwith findings highly suspicious for tonsillar squamous cell carcinoma. Dr. Carlis Abbott noted  tonsillar mass is 3 x 3 cm also noted a discrete left neck nodule 1.5 x 2 and centimeters, was not able to be removed due to extension beyond the tonsillar fossa. 2.patient was admitted in the hospital on January 06, 2015 with obstructive jaundice.  Weakness.  Poor appetite.  Abdominal discomfort.  VISIT DIAGNOSIS:     ICD-9-CM ICD-10-CM   1. Hyperbilirubinemia 782.4 E80.6   2. Obstructive jaundice 576.8 K83.8 MR Abd W/Wo Cm/MRCP     MR Abd W/Wo Cm/MRCP     CANCELED: MR ABDOMEN MRCP WO CM     CANCELED: MR ABDOMEN MRCP WO CM  3. Pancreatic mass 577.9 K86.9      Oncology History   81 year old male referred by [facility]  with newly diagnosed left tonsillar squamous cell cancer.patient had presented to Dr. Rosanna Randy for left-sided throat pain and his wife had noted some mild swelling in  his neck.  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  Had been previously treated with oral antibiotics .remote history of smoking that he quit in 1999. Patient underwent fiberoptic laryngoscopy on 05/09/2013 with findings of left posterior displacement fullness inferior aspect of the left nasopharynxwith findings highly suspicious for tonsillar squamous cell carcinoma. Dr. Carlis Abbott noted  tonsillar mass is 3 x 3 cm also noted a discrete left neck nodule 1.5 x 2 and centimeters, was not able to be removed due to extension beyond the tonsillar fossa     Cancer of tonsil   05/09/2013 Initial Diagnosis Cancer of tonsil      INTERVAL HISTORY:   Patient was admitted in the hospital with history of far dark urine.  Patient is been trialed week.  Had itching. Initial hospital stay patient was found to obstructive jaundice with bilirubin of 18 MRI scan revealed a pancreatic mass Ultrasound revealed a suspicious looking mass in the pancreatic head and biliary duct dilation. I was asked to evaluate patient.  ERCP being planned today    REVIEW OF SYSTEMS:   Gen. Status: Patient is lying in the bed without any acute discomfort.  Not in any acute distress.  HEENT: Patient has a previous history of tonsillar cancer treated with chemoradiation therapy Musculoskeletal system back pain and discomfort GI: No nausea no vomiting patient had vague abdominal discomfort. Endocrine system is hyponatremia GU: complains of dark urine Lower extremity no swelling.  Skin generalized itching Stool color has changed No rectal  bleeding Neurological system: No headache no dizziness no seizure disorder Musculoskeletal system no bony pain  As per HPI. Otherwise, a complete review of systems is negatve.  PAST MEDICAL HISTORY: Past Medical History  Diagnosis Date  . Hyperlipidemia   . 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 Hypertension   . Diabetes  mellitus without complication   . CAD (coronary artery disease)   . Vitamin D deficiency   . AAA (abdominal aortic aneurysm)     PAST SURGICAL HISTORY: Past Surgical History  Procedure Laterality Date  . Portacath placement  Fe 2015    [name]  . Axillary lymph node biopsy Left 11-28-13    atyical squamous cells  . Carotid stent  1999  . Hernia repair Bilateral 10-06-00    inguinal/ Dr. [name]  . Lymph node dissection Left 12-30-13    axilla    FAMILY HISTORY Family History  Problem Relation Age of Onset  . Diabetes Mother   . Heart disease Mother   . Stroke Sister   . Kidney disease Sister     kidney failure  . Hypertension Brother         ADVANCED DIRECTIVES:  Patient does have advance healthcare directive, Patient   does not desire to make any changes  HEALTH MAINTENANCE: Social History  Substance Use Topics  . Smoking status: Former Smoker -- 1.00 packs/day for 15 years    Types: Cigarettes    Quit date: 04/11/1997  . Smokeless tobacco: Never Used  . Alcohol Use: No      No Known Allergies  Current Facility-Administered Medications  Medication Dose Route Frequency Provider Last Rate Last Dose  . alteplase (CATHFLO ACTIVASE) injection 2 mg  2 mg Intracatheter Once PRN Theodoro Grist, MD      . aspirin EC tablet 81 mg  81 mg Oral Daily Theodoro Grist, MD   81 mg at 01/07/15 1836  . heparin injection 5,000 Units  5,000 Units Subcutaneous 3 times per day Theodoro Grist, MD   5,000 Units at 01/07/15 1836  . heparin lock flush 100 unit/mL  500 Units Intravenous Once Theodoro Grist, MD   500 Units at 01/07/15 2007  . insulin aspart (novoLOG) injection 0-5 Units  0-5 Units Subcutaneous QHS Theodoro Grist, MD   0 Units at 01/07/15 2240  . insulin aspart (novoLOG) injection 0-9 Units  0-9 Units Subcutaneous TID WC Theodoro Grist, MD   0 Units at 01/07/15 1721  . metoprolol (LOPRESSOR) tablet 50 mg  50 mg Oral BID Theodoro Grist, MD   50 mg at 01/07/15 2243  . ondansetron  (ZOFRAN) tablet 4 mg  4 mg Oral Q6H PRN Theodoro Grist, MD       Or  . ondansetron (ZOFRAN) injection 4 mg  4 mg Intravenous Q6H PRN Theodoro Grist, MD      . pantoprazole (PROTONIX) EC tablet 40 mg  40 mg Oral Daily Theodoro Grist, MD   40 mg at 01/07/15 1808  . sodium chloride (hypertonic) 3 % solution   Intravenous Continuous Theodoro Grist, MD 25 mL/hr at 01/08/15 0141 25 mL/hr at 01/08/15 0141  . sodium chloride 0.9 % injection 10 mL  10 mL Intravenous PRN Theodoro Grist, MD       Facility-Administered Medications Ordered in Other Encounters  Medication Dose Route Frequency Provider Last Rate Last Dose  . sodium chloride 0.9 % injection 10 mL  10 mL Intravenous PRN Evlyn Kanner, NP   10 mL at 12/05/14  1103    OBJECTIVE: PHYSICAL EXAM: Gen. Status: Patient is alert oriented not any acute distress lying in the bed. Lymphatic system: Supraclavicular, cervical, axillary, inguinal lymph nodes are not palpable HEENT: Jaundice otherwise examination of mouth is clear on limited examination no abnormality was found Lungs: Emphysematous chest with diminished air entry Cardiac: Soft systolic murmur Abdomen: Soft tenderness in epigastric area Lower extremity no edema Skin: Dry scaly Neurologically, the patient was awake, alert, and oriented to person, place and time. There were no obvious focal neurologic abnormalities. Musculoskeletal system within normal limit  Filed Vitals:   01/08/15 0829  BP: 119/60  Pulse: 63  Temp: 98.4 F (36.9 C)  Resp: 18     Body mass index is 24.13 kg/(m^2).    ECOG FS:1 - Symptomatic but completely ambulatory  LAB RESULTS:  Admission on 01/07/2015  Component Date Value Ref Range Status  . Sodium 01/07/2015 111* 135 - 145 mmol/L Final   Comment: CRITICAL RESULT CALLED TO, READ BACK BY AND VERIFIED WITH: KATIE CRANSTON ON 01/07/15 AT 1430 BY TB. RESULT REPEATED AND VERIFIED   . Potassium 01/07/2015 4.6  3.5 - 5.1 mmol/L Final   HEMOLYSIS AT THIS LEVEL  MAY AFFECT RESULT  . Chloride 01/07/2015 77* 101 - 111 mmol/L Final  . CO2 01/07/2015 24  22 - 32 mmol/L Final  . Glucose, Bld 01/07/2015 176* 65 - 99 mg/dL Final  . BUN 01/07/2015 25* 6 - 20 mg/dL Final  . Creatinine, Ser 01/07/2015 0.84  0.61 - 1.24 mg/dL Final   ICTERUS AT THIS LEVEL MAY AFFECT RESULT  . Calcium 01/07/2015 8.3* 8.9 - 10.3 mg/dL Final  . Total Protein 01/07/2015 6.4* 6.5 - 8.1 g/dL Final  . Albumin 01/07/2015 2.4* 3.5 - 5.0 g/dL Final  . AST 01/07/2015 189* 15 - 41 U/L Final  . ALT 01/07/2015 149* 17 - 63 U/L Final   ICTERUS AT THIS LEVEL MAY AFFECT RESULT  . Alkaline Phosphatase 01/07/2015 871* 38 - 126 U/L Final  . Total Bilirubin 01/07/2015 19.7* 0.3 - 1.2 mg/dL Final   Comment: CRITICAL RESULT CALLED TO, READ BACK BY AND VERIFIED WITH: KATIE CRANSTON ON 01/07/15 AT 1430 BY TB.   Marland Kitchen GFR calc non Af Amer 01/07/2015 >60  >60 mL/min Final  . GFR calc Af Amer 01/07/2015 >60  >60 mL/min Final   Comment: (NOTE) The eGFR has been calculated using the CKD EPI equation. This calculation has not been validated in all clinical situations. eGFR's persistently <60 mL/min signify possible Chronic Kidney Disease.   . Anion gap 01/07/2015 10  5 - 15 Final  . Lipase 01/07/2015 739* 22 - 51 U/L Final   RESULTS CONFIRMED BY MANUAL DILUTION  . WBC 01/07/2015 8.2  3.8 - 10.6 K/uL Final  . RBC 01/07/2015 3.56* 4.40 - 5.90 MIL/uL Final  . Hemoglobin 01/07/2015 10.1* 13.0 - 18.0 g/dL Final  . HCT 01/07/2015 29.5* 40.0 - 52.0 % Final  . MCV 01/07/2015 82.7  80.0 - 100.0 fL Final  . MCH 01/07/2015 28.4  26.0 - 34.0 pg Final  . MCHC 01/07/2015 34.3  32.0 - 36.0 g/dL Final  . RDW 01/07/2015 15.1* 11.5 - 14.5 % Final  . Platelets 01/07/2015 471* 150 - 440 K/uL Final  . Neutrophils Relative % 01/07/2015 84   Final  . Lymphocytes Relative 01/07/2015 7   Final  . Monocytes Relative 01/07/2015 8   Final  . Eosinophils Relative 01/07/2015 0   Final  . Basophils Relative 01/07/2015 0  Final  . Band Neutrophils 01/07/2015 1   Final  . Metamyelocytes Relative 01/07/2015 0   Final  . Myelocytes 01/07/2015 0   Final  . Promyelocytes Absolute 01/07/2015 0   Final  . Blasts 01/07/2015 0   Final  . nRBC 01/07/2015 0  0 /100 WBC Final  . Other 01/07/2015 0   Final  . Neutro Abs 01/07/2015 6.9* 1.4 - 6.5 K/uL Final  . Lymphs Abs 01/07/2015 0.6* 1.0 - 3.6 K/uL Final  . Monocytes Absolute 01/07/2015 0.7  0.2 - 1.0 K/uL Final  . Eosinophils Absolute 01/07/2015 0.0  0 - 0.7 K/uL Final  . Basophils Absolute 01/07/2015 0.0  0 - 0.1 K/uL Final  . Smear Review 01/07/2015 MORPHOLOGY UNREMARKABLE   Final  . Bilirubin, Direct 01/07/2015 12.7* 0.1 - 0.5 mg/dL Final   RESULTS CONFIRMED BY MANUAL DILUTION  . Hgb A1c MFr Bld 01/07/2015 5.9  4.0 - 6.0 % Final  . Sodium 01/07/2015 112* 135 - 145 mmol/L Final   CRITICAL RESULT CALLED TO, READ BACK BY AND VERIFIED WITH LINDSEY PERKINS 01/07/2015 2046 LKH  . Sodium 01/07/2015 112* 135 - 145 mmol/L Final   CRITICAL RESULT CALLED TO, READ BACK BY AND VERIFIED WITH LINDSEY PERKINS 01/07/2015 2042 LKH  . Sodium 01/08/2015 112* 135 - 145 mmol/L Final   CRITICAL RESULT CALLED TO, READ BACK BY AND VERIFIED WITH LINDSEY PERKINS 01/08/2015 0034 LKH  . WBC 01/07/2015 8.2  3.8 - 10.6 K/uL Final  . RBC 01/07/2015 3.33* 4.40 - 5.90 MIL/uL Final  . Hemoglobin 01/07/2015 9.8* 13.0 - 18.0 g/dL Final  . HCT 01/07/2015 27.6* 40.0 - 52.0 % Final  . MCV 01/07/2015 83.0  80.0 - 100.0 fL Final  . MCH 01/07/2015 29.4  26.0 - 34.0 pg Final  . MCHC 01/07/2015 35.4  32.0 - 36.0 g/dL Final  . RDW 01/07/2015 15.3* 11.5 - 14.5 % Final  . Platelets 01/07/2015 450* 150 - 440 K/uL Final  . Creatinine, Ser 01/07/2015 0.88  0.61 - 1.24 mg/dL Final   ICTERUS AT THIS LEVEL MAY AFFECT RESULT  . GFR calc non Af Amer 01/07/2015 >60  >60 mL/min Final  . GFR calc Af Amer 01/07/2015 >60  >60 mL/min Final   Comment: (NOTE) The eGFR has been calculated using the CKD EPI  equation. This calculation has not been validated in all clinical situations. eGFR's persistently <60 mL/min signify possible Chronic Kidney Disease.   . Glucose-Capillary 01/07/2015 117* 65 - 99 mg/dL Final  . Sodium 01/08/2015 113* 135 - 145 mmol/L Final   CRITICAL RESULT CALLED TO, READ BACK BY AND VERIFIED WITH LYNDSEY PERKINS 01/08/2015 0536 LKH  . Potassium 01/08/2015 4.2  3.5 - 5.1 mmol/L Final  . Chloride 01/08/2015 83* 101 - 111 mmol/L Final  . CO2 01/08/2015 23  22 - 32 mmol/L Final  . Glucose, Bld 01/08/2015 101* 65 - 99 mg/dL Final  . BUN 01/08/2015 24* 6 - 20 mg/dL Final  . Creatinine, Ser 01/08/2015 0.90  0.61 - 1.24 mg/dL Final   ICTERUS AT THIS LEVEL MAY AFFECT RESULT  . Calcium 01/08/2015 7.8* 8.9 - 10.3 mg/dL Final  . Total Protein 01/08/2015 5.7* 6.5 - 8.1 g/dL Final  . Albumin 01/08/2015 2.1* 3.5 - 5.0 g/dL Final  . AST 01/08/2015 165* 15 - 41 U/L Final  . ALT 01/08/2015 130* 17 - 63 U/L Final   ICTERUS AT THIS LEVEL MAY AFFECT RESULT  . Alkaline Phosphatase 01/08/2015 786* 38 - 126 U/L Final  .  Total Bilirubin 01/08/2015 18.5* 0.3 - 1.2 mg/dL Final   CRITICAL RESULT CALLED TO, READ BACK BY AND VERIFIED WITH LYNDSEY PERKINS 01/08/2015 0536 LKH  . GFR calc non Af Amer 01/08/2015 >60  >60 mL/min Final  . GFR calc Af Amer 01/08/2015 >60  >60 mL/min Final   Comment: (NOTE) The eGFR has been calculated using the CKD EPI equation. This calculation has not been validated in all clinical situations. eGFR's persistently <60 mL/min signify possible Chronic Kidney Disease.   . Anion gap 01/08/2015 7  5 - 15 Final  . WBC 01/08/2015 7.6  3.8 - 10.6 K/uL Final  . RBC 01/08/2015 3.15* 4.40 - 5.90 MIL/uL Final  . Hemoglobin 01/08/2015 8.9* 13.0 - 18.0 g/dL Final  . HCT 01/08/2015 26.3* 40.0 - 52.0 % Final  . MCV 01/08/2015 83.3  80.0 - 100.0 fL Final  . MCH 01/08/2015 28.4  26.0 - 34.0 pg Final  . MCHC 01/08/2015 34.1  32.0 - 36.0 g/dL Final  . RDW 01/08/2015 15.4* 11.5 -  14.5 % Final  . Platelets 01/08/2015 427  150 - 440 K/uL Final  . Sodium 01/08/2015 113* 135 - 145 mmol/L Final   Comment: CRITICAL RESULT CALLED TO, READ BACK BY AND VERIFIED WITH STEVEN SYKES AT 1950 ON 01/08/15.Marland KitchenMarland KitchenAttu Station   . Glucose-Capillary 01/07/2015 127* 65 - 99 mg/dL Final  . Glucose-Capillary 01/08/2015 107* 65 - 99 mg/dL Final  Office Visit on 01/05/2015  Component Date Value Ref Range Status  . Glucose 01/06/2015 178* 65 - 99 mg/dL Final  . BUN 01/06/2015 21  8 - 27 mg/dL Final  . Creatinine, Ser 01/06/2015 1.23  0.76 - 1.27 mg/dL Final  . GFR calc non Af Amer 01/06/2015 55* >59 mL/min/1.73 Final  . GFR calc Af Amer 01/06/2015 64  >59 mL/min/1.73 Final  . BUN/Creatinine Ratio 01/06/2015 17  10 - 22 Final  . Sodium 01/06/2015 111* 134 - 144 mmol/L Final   **Verified by repeat analysis**  . Potassium 01/06/2015 5.0  3.5 - 5.2 mmol/L Final  . Chloride 01/06/2015 73* 97 - 108 mmol/L Final   **Verified by repeat analysis**  . CO2 01/06/2015 22  18 - 29 mmol/L Final  . Calcium 01/06/2015 8.8  8.6 - 10.2 mg/dL Final  . Total Protein 01/06/2015 5.8* 6.0 - 8.5 g/dL Final  . Albumin 01/06/2015 3.0* 3.5 - 4.8 g/dL Final  . Globulin, Total 01/06/2015 2.8  1.5 - 4.5 g/dL Final  . Albumin/Globulin Ratio 01/06/2015 1.1  1.1 - 2.5 Final  . Bilirubin Total 01/06/2015 18.4* 0.0 - 1.2 mg/dL Final   Comment: Note: Specimen is icteric. **Verified by repeat analysis**   . Alkaline Phosphatase 01/06/2015 978* 39 - 117 IU/L Final  . AST 01/06/2015 199* 0 - 40 IU/L Final  . ALT 01/06/2015 168* 0 - 44 IU/L Final  . WBC 01/06/2015 9.2  3.4 - 10.8 x10E3/uL Final  . RBC 01/06/2015 3.78* 4.14 - 5.80 x10E6/uL Final  . Hemoglobin 01/06/2015 10.7* 12.6 - 17.7 g/dL Final  . Hematocrit 01/06/2015 31.1* 37.5 - 51.0 % Final  . MCV 01/06/2015 82  79 - 97 fL Final  . MCH 01/06/2015 28.3  26.6 - 33.0 pg Final  . MCHC 01/06/2015 34.4  31.5 - 35.7 g/dL Final  . RDW 01/06/2015 16.5* 12.3 - 15.4 % Final  .  Platelets 01/06/2015 510* 150 - 379 x10E3/uL Final  . Neutrophils 01/06/2015 76   Final  . Lymphs 01/06/2015 9   Final  . Monocytes 01/06/2015  11   Final  . Eos 01/06/2015 3   Final  . Basos 01/06/2015 0   Final  . Neutrophils Absolute 01/06/2015 7.0  1.4 - 7.0 x10E3/uL Final  . Lymphocytes Absolute 01/06/2015 0.8  0.7 - 3.1 x10E3/uL Final  . Monocytes Absolute 01/06/2015 1.0* 0.1 - 0.9 x10E3/uL Final  . EOS (ABSOLUTE) 01/06/2015 0.3  0.0 - 0.4 x10E3/uL Final  . Basophils Absolute 01/06/2015 0.0  0.0 - 0.2 x10E3/uL Final  . Immature Granulocytes 01/06/2015 1   Final  . Immature Grans (Abs) 01/06/2015 0.1  0.0 - 0.1 x10E3/uL Final  . TSH 01/06/2015 4.580* 0.450 - 4.500 uIU/mL Final  . Color, UA 01/05/2015 amber   Final  . Clarity, UA 01/05/2015 clear   Final  . Glucose, UA 01/05/2015 negative   Final  . Bilirubin, UA 01/05/2015 small   Final  . Ketones, UA 01/05/2015 moderate   Final  . Spec Grav, UA 01/05/2015 1.020   Final  . Blood, UA 01/05/2015 small   Final  . pH, UA 01/05/2015 6.5   Final  . Protein, UA 01/05/2015 small   Final  . Urobilinogen, UA 01/05/2015 0.2   Final  . Nitrite, UA 01/05/2015 negative   Final  . Leukocytes, UA 01/05/2015 Trace* Negative Final  . Urine Culture, Routine 01/05/2015 Final report   Final  . Urine Culture result 1 01/05/2015 No growth   Final  . IFOBT 01/05/2015 Negative   Final     STUDIES: Mr Abd W/wo Cm/mrcp  01/07/2015   CLINICAL DATA:  Biliary obstruction.  Jaundice  EXAM: MRI ABDOMEN WITHOUT AND WITH CONTRAST (INCLUDING MRCP)  TECHNIQUE: Multiplanar multisequence MR imaging of the abdomen was performed both before and after the administration of intravenous contrast. Heavily T2-weighted images of the biliary and pancreatic ducts were obtained, and three-dimensional MRCP images were rendered by post processing.  CONTRAST:  51mL MULTIHANCE GADOBENATE DIMEGLUMINE 529 MG/ML IV SOLN  COMPARISON:  05/06/2014  FINDINGS: Lower chest: Small bilateral  pleural effusions are identified. New from previous study.  Hepatobiliary: No focal liver lesions identified. Marked intrahepatic bile duct dilatation. The extrahepatic common bile duct measured 1 cm. Abrupt cut off of the common bile duct is identified.  Pancreas: There is an ill defined mass arising from the head of pancreas which measures approximately 4.1 x 3.6 x 4.6 cm. The mass completely encases the portal venous confluence with significant diminished caliber of the portal vein. Branches of the celiac trunk appear encased by this mass. The SMA appears patent an unaffected.  Spleen: Normal appearance of the spleen.  Adrenals/Urinary Tract: The adrenal glands are unremarkable. Bilateral renal cysts noted.  Stomach/Bowel: The stomach and the upper abdominal bowel loops are unremarkable.  Vascular/Lymphatic: Status post repair of infrarenal abdominal aortic aneurysm. The aneurysm sac measures 4.6 cm, image 7 of series 13. Previously 5.5 cm. No retroperitoneal adenopathy identified.  Other: There is a small amount of ascites identified within the upper abdomen.  Musculoskeletal: No specific findings to suggest bone metastasis.  IMPRESSION: 1. Large mass arising from the head of pancreas is identified worrisome for primary pancreatic neoplasm. 2. There is complete obstruction of the common bile duct. The portal venous confluence is completely encased by the lesion. There is also involvement of a branches of the celiac trunk. 3. No specific findings identified to suggest liver metastasis.   Electronically Signed   By: Signa Kell M.D.   On: 01/07/2015 17:53   US Abdomen Limited Ruq  01/07/2015  CLINICAL DATA:  Elevated liver function studies, history of diabetes, alcohol dependence in remission, coronary artery disease.  EXAM: US ABDOMEN LIMITED - RIGHT UPPER QUADRANT  COMPARISON:  Abdominal pelvic CT scan of December 20, 2013  FINDINGS: Gallbladder:  The gallbladder is adequately distended. There is  echogenic bile versus tiny floating stones within the gallbladder lumen. There is no gallbladder wall thickening, pericholecystic fluid, or positive sonographic Murphy's sign.  Common bile duct:  Diameter: 10 mm  Liver:  The intrahepatic ducts are dilated. No discrete hepatic mass is observed. There is a hypoechoic mass associated with the inferior aspect of the pancreatic head. There is ascites.  IMPRESSION: Suspicious masslike lesion in the region of the pancreatic head resulting in biliary ductal dilation. Possible tiny gallstones versus echogenic bile.  Abdominal MRI or CT scanning is recommended.   Electronically Signed   By: David  Martinique M.D.   On: 01/07/2015 14:03    ASSESSMENT: obstructive jaundice MRI scan shows large mass arising from pancreas and this is unresectable based on portal venous involvement as well as celiac axis involvement MRI scan has been reviewed independently I agree with proceeding with ERCP and if ERCP fails to achieve appropriate decompression then percutaneous  billiary  drainage can be adone  Once appropriate.  drainage is establishred then  this patient can be discharge and I will follow-up this patient as outpatient to arrange for further diagnostic workup including EUS and further discussion regarding treatment. Patient has tonsillar cancer and at present time there is no evidence of recurrent or progressive disease Also has hyponatremia Which may be secondary to malignancy with SIADH or may be lab error because of's significant high level of bilirubin In any way nephrology consult can be obtained to correct hyponatremia if needed prior to discharge or patient can handle hypertonic normal saline as outpatient I will follow-up on . Our nurse Navigator would be informed about this patient to arrange for appropriate diagnostic workup as outpatient.  Patient does not have to be in the hospital for further diagnostic workup. In 19-9 is pending    Patient expressed  understanding and was in agreement with this plan. He also understands that He can call clinic at any time with any questions, concerns, or complaints.    No matching staging information was found for the patient.  Forest Gleason, MD   01/08/2015 10:07 AM

## 2015-01-08 NOTE — Progress Notes (Signed)
PT Hol Note  Patient Details Name: Geoffrey Gregory MRN: [id number] DOB: 04-Sep-1932   Cancelled Treatment:    Reason Eval/Treat Not Completed: Medical issues which prohibited therapy. Chart reviewed by therapist in AM. Pt with low sodium but stable. Per MD report pt is asymptomatic but awaiting nephrology consult. Pt going for ERCP this afternoon. Will hold patient currently pending consults and procedure and attempt tomorrow as appropriate.  Lyndel Safe Huprich PT, DPT   Huprich,Jason 01/08/2015, 1:21 PM

## 2015-01-08 NOTE — Progress Notes (Signed)
Dr Benjie Karvonen notified of critical VALUE sodium of 115

## 2015-01-08 NOTE — Progress Notes (Addendum)
MEDICATION RELATED CONSULT NOTE - INITIAL   Pharmacy Consult for hypertonic saline Indication: hyponatremia  No Known Allergies  Patient Measurements: Height: 5\' 5"  (165.1 cm) Weight: 145 lb (65.772 kg) IBW/kg (Calculated) : 61.5 Adjusted Body Weight: 65.8 kg  Vital Signs: Temp: 97.8 F (36.6 C) (09/29 1824) Temp Source: Oral (09/29 1331) BP: 141/70 mmHg (09/29 1808) Pulse Rate: 62 (09/29 1331) Intake/Output from previous day: 09/28 0701 - 09/29 0700 In: 240 [P.O.:240] Out: 125 [Urine:125] Intake/Output from this shift: Total I/O In: 450 [I.V.:450] Out: -   Labs:  Recent Labs  01/06/15 1033 01/07/15 1149 01/07/15 1953 01/08/15 0428 01/08/15 0858  WBC 9.2 8.2 8.2 7.6  --   HGB  --  10.1* 9.8* 8.9*  --   HCT 31.1* 29.5* 27.6* 26.3*  --   PLT  --  471* 450* 427  --   CREATININE 1.23 0.84 0.88 0.90 0.91  ALBUMIN  --  2.4*  --  2.1*  --   PROT 5.8* 6.4*  --  5.7*  --   AST 199* 189*  --  165*  --   ALT 168* 149*  --  130*  --   ALKPHOS 978* 871*  --  786*  --   BILITOT 18.4* 19.7*  --  18.5*  --   BILIDIR  --  12.7*  --   --   --    Estimated Creatinine Clearance: 57.3 mL/min (by C-G formula based on Cr of 0.91).   Microbiology: Recent Results (from the past 720 hour(s))  Urine Culture     Status: None   Collection Time: 01/05/15 12:00 AM  Result Value Ref Range Status   Urine Culture, Routine Final report  Final   Urine Culture result 1 No growth  Final    Medical History: Past Medical History  Diagnosis Date  . Hyperlipidemia   . 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 Hypertension   . Diabetes mellitus without complication   . CAD (coronary artery disease)   . Vitamin D deficiency   . AAA (abdominal aortic aneurysm)     Medications:  Infusions:  . sodium chloride (hypertonic) 40 mL/hr (01/08/15 1419)    Assessment: 81 yom cc weakness with history of cancer sp chemo/radiation approx 2 years ago. Presents with dark urine and elevated  bilirubin. Na 111 on admission, baseline low 130s. Has been on 100 mL/hr normal saline and serum sodium isn't responding, pharmacy consulted to monitor hypertonic saline. Nephrology consult pending  Goal of Therapy:  Sodium 135 mEq/L  Plan:  Hypertonic sodium chloride 25 mL/hr, will check serum sodium level every 4 hours and adjust dose as needed to avoid correction of greater than 8 mEq in 24 hours.   6010 0428 Na 113 will continue current rate with goal correction of 8 mEq in 24 hours. 9323 0857 Na 113 will continue current rate with goal correction of 8 mEq in 24 hours.   5573 1258 Na 113 - nephrology following, rate increased to 67ml/hr. Will continue Q4H monitoring, with goal correction of 8 mEq in 24H  0929  1600 :   Na = 115 ,  Will continue hypertonic saline and recheck Na in 4 hrs.             20:00 :  Na = 123,  This represents an increase in Na > 8 mEq in 24 hrs.  Will d/c hypertonic saline and continue  to recheck Na every 4 hrs.   Will recheck sodium in 4 hours.  Violeta Gelinas, PharmD  Clinical Pharmacist 01/08/2015 6:36 PM

## 2015-01-08 NOTE — Anesthesia Procedure Notes (Signed)
Procedure Name: Intubation Date/Time: 01/08/2015 5:40 PM Performed by: Dionne Bucy Pre-anesthesia Checklist: Patient identified, Emergency Drugs available, Suction available, Patient being monitored and Timeout performed Patient Re-evaluated:Patient Re-evaluated prior to inductionOxygen Delivery Method: Circle system utilized Preoxygenation: Pre-oxygenation with 100% oxygen Intubation Type: IV induction Ventilation: Mask ventilation without difficulty Laryngoscope Size: McGraph and 4 (see quick note) Grade View: Grade IV Number of attempts: 5 or more Airway Equipment and Method: Stylet,  Bougie stylet and Video-laryngoscopy Dental Injury: Teeth and Oropharynx as per pre-operative assessment  Difficulty Due To: Difficulty was unanticipated Future Recommendations: Recommend- induction with short-acting agent, and alternative techniques readily available Comments: See quick note @ 1740

## 2015-01-08 NOTE — Progress Notes (Signed)
MEDICATION RELATED CONSULT NOTE - INITIAL   Pharmacy Consult for hypertonic saline Indication: hyponatremia  No Known Allergies  Patient Measurements: Height: 5\' 5"  (165.1 cm) Weight: 145 lb (65.772 kg) IBW/kg (Calculated) : 61.5 Adjusted Body Weight: 65.8 kg  Vital Signs: Temp: 98.5 F (36.9 C) (09/29 1331) Temp Source: Oral (09/29 1331) BP: 115/61 mmHg (09/29 1331) Pulse Rate: 62 (09/29 1331) Intake/Output from previous day: 09/28 0701 - 09/29 0700 In: 240 [P.O.:240] Out: 125 [Urine:125] Intake/Output from this shift:    Labs:  Recent Labs  01/06/15 1033 01/07/15 1149 01/07/15 1953 01/08/15 0428 01/08/15 0858  WBC 9.2 8.2 8.2 7.6  --   HGB  --  10.1* 9.8* 8.9*  --   HCT 31.1* 29.5* 27.6* 26.3*  --   PLT  --  471* 450* 427  --   CREATININE 1.23 0.84 0.88 0.90 0.91  ALBUMIN  --  2.4*  --  2.1*  --   PROT 5.8* 6.4*  --  5.7*  --   AST 199* 189*  --  165*  --   ALT 168* 149*  --  130*  --   ALKPHOS 978* 871*  --  786*  --   BILITOT 18.4* 19.7*  --  18.5*  --   BILIDIR  --  12.7*  --   --   --    Estimated Creatinine Clearance: 57.3 mL/min (by C-G formula based on Cr of 0.91).   Microbiology: Recent Results (from the past 720 hour(s))  Urine Culture     Status: None   Collection Time: 01/05/15 12:00 AM  Result Value Ref Range Status   Urine Culture, Routine Final report  Final   Urine Culture result 1 No growth  Final    Medical History: Past Medical History  Diagnosis Date  . Hyperlipidemia   . 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 Hypertension   . Diabetes mellitus without complication   . CAD (coronary artery disease)   . Vitamin D deficiency   . AAA (abdominal aortic aneurysm)     Medications:  Infusions:  . sodium chloride (hypertonic) 40 mL/hr (01/08/15 1419)    Assessment: Geoffrey Gregory cc weakness with history of cancer sp chemo/radiation approx 2 years ago. Presents with dark urine and elevated bilirubin. Na 111 on admission,  baseline low 130s. Has been on 100 mL/hr normal saline and serum sodium isn't responding, pharmacy consulted to monitor hypertonic saline. Nephrology consult pending  Goal of Therapy:  Sodium 135 mEq/L  Plan:  Hypertonic sodium chloride 25 mL/hr, will check serum sodium level every 4 hours and adjust dose as needed to avoid correction of greater than 8 mEq in 24 hours.   4888 0428 Na 113 will continue current rate with goal correction of 8 mEq in 24 hours. 9169 0857 Na 113 will continue current rate with goal correction of 8 mEq in 24 hours.   4503 1258 Na 113 - nephrology following, rate increased to 13ml/hr. Will continue Q4H monitoring, with goal correction of 8 mEq in 24H  Will recheck sodium in 4 hours.  Rexene Edison, PharmD Clinical Pharmacist 01/08/2015 3:23 PM

## 2015-01-08 NOTE — Transfer of Care (Signed)
Immediate Anesthesia Transfer of Care Note  Patient: Geoffrey Gregory  Procedure(s) Performed: Procedure(s): ENDOSCOPIC RETROGRADE CHOLANGIOPANCREATOGRAPHY (ERCP) WITH PROPOFOL (N/A)  Patient Location: PACU  Anesthesia Type:General  Level of Consciousness: awake and patient cooperative  Airway & Oxygen Therapy: Patient Spontanous Breathing and Patient connected to nasal cannula oxygen  Post-op Assessment: Report given to RN  Post vital signs: Reviewed and stable  Last Vitals:  Filed Vitals:   01/08/15 1808  BP: 141/70  Pulse: 69  Temp: 36.4 C  Resp: 20    Complications: No apparent anesthesia complications

## 2015-01-08 NOTE — Anesthesia Preprocedure Evaluation (Addendum)
Anesthesia Evaluation  Patient identified by MRN, date of birth, ID band Patient awake    Reviewed: Allergy & Precautions, NPO status , Patient's Chart, lab work & pertinent test results, reviewed documented beta blocker date and time   Airway Mallampati: IV  TM Distance: <3 FB Neck ROM: Limited    Dental  (+) Chipped, Missing   Pulmonary former smoker,    Pulmonary exam normal breath sounds clear to auscultation       Cardiovascular hypertension, Pt. on medications and Pt. on home beta blockers + CAD and + Peripheral Vascular Disease  Normal cardiovascular exam Rhythm:Regular     Neuro/Psych PSYCHIATRIC DISORDERS ETOH dependencenegative neurological ROS     GI/Hepatic Neg liver ROS, GERD  Medicated and Controlled,  Endo/Other  diabetes, Well Controlled, Type 2, Oral Hypoglycemic Agents  Renal/GU negative Renal ROS     Musculoskeletal negative musculoskeletal ROS (+)   Abdominal Normal abdominal exam  (+)   Peds  Hematology negative hematology ROS (+)   Anesthesia Other Findings CA of tonsil  Reproductive/Obstetrics                          Anesthesia Physical Anesthesia Plan  ASA: III  Anesthesia Plan: General   Post-op Pain Management:    Induction: Intravenous  Airway Management Planned: Oral ETT  Additional Equipment:   Intra-op Plan:   Post-operative Plan: Extubation in OR  Informed Consent: I have reviewed the patients History and Physical, chart, labs and discussed the procedure including the risks, benefits and alternatives for the proposed anesthesia with the patient or authorized representative who has indicated his/her understanding and acceptance.   Dental advisory given  Plan Discussed with: CRNA and Surgeon  Anesthesia Plan Comments: (Plan discussed with family and patient.  Patient has had a severely low Na+  Greater than 48 hours in duration and despite  aggressive infusion of 3% saline the serum sodium has not budged.  Normally, we do not do general cases on these patients for the fear of crebral edema, Pulmonary edema and a host of other potential complications, but the bilirubin was very high and the patient was severely obstructed and so Dr.  Allen Norris strongly felt that if we stented this patient than we could correct his sodium easier with the drop in bili.  The family was advised of the potential complications and they agreed to the procedure along with the patient.)       Anesthesia Quick Evaluation

## 2015-01-08 NOTE — Progress Notes (Signed)
[facility] [place] NAME: Geoffrey Gregory    MR#:  [id number]  DATE OF BIRTH:  Jul 08, 1932  SUBJECTIVE:  Patient is doing well this morning. Patient is going for ERCP this afternoon.  REVIEW OF SYSTEMS:    Review of Systems  Constitutional: Negative for fever, chills and malaise/fatigue.  HENT: Negative for sore throat.   Eyes: Negative for blurred vision.  Respiratory: Negative for cough, hemoptysis, shortness of breath and wheezing.   Cardiovascular: Negative for chest pain, palpitations and leg swelling.  Gastrointestinal: Negative for nausea, vomiting, abdominal pain, diarrhea and blood in stool.  Genitourinary: Negative for dysuria.  Musculoskeletal: Negative for back pain.  Skin:       Jaundice  Neurological: Negative for dizziness, tremors and headaches.  Endo/Heme/Allergies: Does not bruise/bleed easily.    Tolerating Diet:NPO      DRUG ALLERGIES:  No Known Allergies  VITALS:  Blood pressure 119/60, pulse 63, temperature 98.4 F (36.9 C), temperature source Oral, resp. rate 18, height 5\' 5"  (1.651 m), weight 65.772 kg (145 lb), SpO2 99 %.  PHYSICAL EXAMINATION:   Physical Exam  Constitutional: He is oriented to person, place, and time and well-developed, well-nourished, and in no distress. No distress.  HENT:  Head: Normocephalic.  Eyes: No scleral icterus.  Neck: Normal range of motion. Neck supple. No JVD present. No tracheal deviation present.  Cardiovascular: Normal rate, regular rhythm and normal heart sounds.  Exam reveals no gallop and no friction rub.   No murmur heard. Pulmonary/Chest: Effort normal and breath sounds normal. No respiratory distress. He has no wheezes. He has no rales. He exhibits no tenderness.  Abdominal: Soft. Bowel sounds are normal. He exhibits no distension and no mass. There is no tenderness. There is no rebound and no guarding.  Musculoskeletal: Normal range of motion. He  exhibits no edema.  Neurological: He is alert and oriented to person, place, and time.  Skin: Skin is warm. No rash noted. No erythema.  Jaundice  Psychiatric: Affect and judgment normal.      LABORATORY PANEL:   CBC  Recent Labs Lab 01/08/15 0428  WBC 7.6  HGB 8.9*  HCT 26.3*  PLT 427   ------------------------------------------------------------------------------------------------------------------  Chemistries   Recent Labs Lab 01/08/15 0428 01/08/15 0857  NA 113* 113*  K 4.2  --   CL 83*  --   CO2 23  --   GLUCOSE 101*  --   BUN 24*  --   CREATININE 0.90  --   CALCIUM 7.8*  --   AST 165*  --   ALT 130*  --   ALKPHOS 786*  --   BILITOT 18.5*  --    ------------------------------------------------------------------------------------------------------------------  Cardiac Enzymes No results for input(s): TROPONINI in the last 168 hours. ------------------------------------------------------------------------------------------------------------------  RADIOLOGY:  Mr Abd W/wo Cm/mrcp  01/07/2015   CLINICAL DATA:  Biliary obstruction.  Jaundice  EXAM: MRI ABDOMEN WITHOUT AND WITH CONTRAST (INCLUDING MRCP)  TECHNIQUE: Multiplanar multisequence MR imaging of the abdomen was performed both before and after the administration of intravenous contrast. Heavily T2-weighted images of the biliary and pancreatic ducts were obtained, and three-dimensional MRCP images were rendered by post processing.  CONTRAST:  34mL MULTIHANCE GADOBENATE DIMEGLUMINE 529 MG/ML IV SOLN  COMPARISON:  05/06/2014  FINDINGS: Lower chest: Small bilateral pleural effusions are identified. New from previous study.  Hepatobiliary: No focal liver lesions identified. Marked intrahepatic bile duct dilatation. The extrahepatic common bile duct measured 1 cm.  Abrupt cut off of the common bile duct is identified.  Pancreas: There is an ill defined mass arising from the head of pancreas which measures  approximately 4.1 x 3.6 x 4.6 cm. The mass completely encases the portal venous confluence with significant diminished caliber of the portal vein. Branches of the celiac trunk appear encased by this mass. The SMA appears patent an unaffected.  Spleen: Normal appearance of the spleen.  Adrenals/Urinary Tract: The adrenal glands are unremarkable. Bilateral renal cysts noted.  Stomach/Bowel: The stomach and the upper abdominal bowel loops are unremarkable.  Vascular/Lymphatic: Status post repair of infrarenal abdominal aortic aneurysm. The aneurysm sac measures 4.6 cm, image 7 of series 13. Previously 5.5 cm. No retroperitoneal adenopathy identified.  Other: There is a small amount of ascites identified within the upper abdomen.  Musculoskeletal: No specific findings to suggest bone metastasis.  IMPRESSION: 1. Large mass arising from the head of pancreas is identified worrisome for primary pancreatic neoplasm. 2. There is complete obstruction of the common bile duct. The portal venous confluence is completely encased by the lesion. There is also involvement of a branches of the celiac trunk. 3. No specific findings identified to suggest liver metastasis.   Electronically Signed   By: Kerby Moors M.D.   On: 01/07/2015 17:53   US Abdomen Limited Ruq  01/07/2015   CLINICAL DATA:  Elevated liver function studies, history of diabetes, alcohol dependence in remission, coronary artery disease.  EXAM: US ABDOMEN LIMITED - RIGHT UPPER QUADRANT  COMPARISON:  Abdominal pelvic CT scan of December 20, 2013  FINDINGS: Gallbladder:  The gallbladder is adequately distended. There is echogenic bile versus tiny floating stones within the gallbladder lumen. There is no gallbladder wall thickening, pericholecystic fluid, or positive sonographic Murphy's sign.  Common bile duct:  Diameter: 10 mm  Liver:  The intrahepatic ducts are dilated. No discrete hepatic mass is observed. There is a hypoechoic mass associated with the inferior  aspect of the pancreatic head. There is ascites.  IMPRESSION: Suspicious masslike lesion in the region of the pancreatic head resulting in biliary ductal dilation. Possible tiny gallstones versus echogenic bile.  Abdominal MRI or CT scanning is recommended.   Electronically Signed   By: David  Martinique M.D.   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  1. Painless jaundice with elevated bilirubin: MRI shows large mass arising from the head of the pancreas which is concerning for primary pancreatic neoplasm. Patient is for ERCP this afternoon. Further recommendations after ERCP by GI. Follow-up on CA 19-9.  2. 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 Sodium level is low however stable. Patient is asymptomatic. Nephrology consultation has been placed for further management.  3. Essential hypertension: Continue metoprolol  4. Diabetes type II without complication: Patient is currently nothing by mouth and therefore is on sliding scale insulin which I will continue. 5. Anemia of chronic disease: Continue to monitor hemoglobin  Management plans discussed with the patient and he is in agreement.  CODE STATUS: FULL  TOTAL TIME TAKING CARE OF THIS PATIENT: 30 minutes.     POSSIBLE D/C 2-3 days, DEPENDING ON CLINICAL CONDITION.   MODY, SITAL M.D on 01/08/2015 at 11:54 AM  Between 7am to 6pm - Pager - [phone] After 6pm go to [email or web address] - [facility]  Office  [phone]  CC: Primary care physician; [name], MD

## 2015-01-08 NOTE — Progress Notes (Signed)
Initial Nutrition Assessment  INTERVENTION:   Meals and Snacks: Cater to patient preferences once able to advance diet order Medical Food Supplement Therapy: will recommend Ensure Enlive po BID, each supplement provides 350 kcal and 20 grams of protein, once able to advance diet order   NUTRITION DIAGNOSIS:   Inadequate oral intake related to acute illness as evidenced by per patient/family report.  GOAL:   Patient will meet greater than or equal to 90% of their needs  MONITOR:    (Energy Intake, Hepatic Profile, Anthropometrics, Electrolyte and renal Profile)  REASON FOR ASSESSMENT:   Diagnosis    ASSESSMENT:   Pt admitted with obstructive jaundice. Per MD note, mass present in pancreastic area compressing on common bile duct. Pt scheduled for ERCP 9/29.  Past Medical History  Diagnosis Date  . Hyperlipidemia   . 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 Hypertension   . Diabetes mellitus without complication   . CAD (coronary artery disease)   . Vitamin D deficiency   . AAA (abdominal aortic aneurysm)     Diet Order:  Diet NPO time specified    Current Nutrition: Pt currently NPO  Food/Nutrition-Related History: Pt reports appetite has been down for the past week. Pt reports eating <50% of meals not wanting to eat at all. Pt reports 2 years ago pt having treatments for tonsillar cancer and had been drinking Ensure daily, multiple times a day but has been trying to not drink them as much recently but to eat enough instead.   Medications: 3%NaCl at 21mL/hr, Protonix  Electrolyte/Renal Profile and Glucose Profile:   Recent Labs Lab 01/07/15 1149  01/07/15 1953  01/08/15 0428 01/08/15 0858 01/08/15 1258  NA 111*  < > 112*  < > 113* 113* 113*  K 4.6  --   --   --  4.2 4.0  --   CL 77*  --   --   --  83* 81*  --   CO2 24  --   --   --  23 22  --   BUN 25*  --   --   --  24* 23*  --   CREATININE 0.84  --  0.88  --  0.90 0.91  --   CALCIUM 8.3*  --   --    --  7.8* 7.8*  --   GLUCOSE 176*  --   --   --  101* 97  --   < > = values in this interval not displayed. Protein Profile:  Recent Labs Lab 01/07/15 1149 01/08/15 0428  ALBUMIN 2.4* 2.1*   Hepatic Function Latest Ref Rng 01/08/2015 01/07/2015 01/06/2015  Total Protein 6.5 - 8.1 g/dL 5.7(L) 6.4(L) 5.8(L)  Albumin 3.5 - 5.0 g/dL 2.1(L) 2.4(L) -  AST 15 - 41 U/L 165(H) 189(H) 199(H)  ALT 17 - 63 U/L 130(H) 149(H) 168(H)  Alk Phosphatase 38 - 126 U/L 786(H) 871(H) 978(H)  Total Bilirubin 0.3 - 1.2 mg/dL 18.5(HH) 19.7(HH) 18.4(>)  Bilirubin, Direct 0.1 - 0.5 mg/dL - 12.7(H) -   Gastrointestinal Profile: Last BM:  01/07/2015   Nutrition-Focused Physical Exam Findings: Nutrition-Focused physical exam completed. Findings are WDL for fat depletion, muscle depletion, and edema.    Weight Change: Pt reports weight loss 2 years ago when pt undergoing treatments for cancer. Pt reports he has been gaining weight since. Pt reports current weight has been 148lbs PTA. Weight on admission of 145lbs.RD notes weight from this past couple of weeks  from outpatient MD offices.   Skin:  Reviewed, no issues  Height:   Ht Readings from Last 1 Encounters:  01/07/15 $RemoveB'5\' 5"'jyYsqQbS$  (1.651 m)    Weight:   Wt Readings from Last 1 Encounters:  01/07/15 145 lb (65.772 kg)    Wt Readings from Last 10 Encounters:  01/07/15 145 lb (65.772 kg)  01/07/15 153 lb 9.6 oz (69.673 kg)  01/05/15 155 lb (70.308 kg)  12/25/14 148 lb (67.132 kg)  12/08/14 147 lb (66.679 kg)  12/05/14 145 lb 15.1 oz (66.2 kg)  06/09/14 147 lb (66.679 kg)  06/09/14 147 lb (66.679 kg)  02/11/14 144 lb (65.318 kg)  01/07/14 145 lb (65.772 kg)     BMI:  Body mass index is 24.13 kg/(m^2).  Estimated Nutritional Needs:   Kcal:  BEE: 1295kcals, TEE: (IF 1.1-1.3)(AF 1.2) 1709-2020kcals  Protein:  66-78g protein (1.0-1.2g/kg)   Fluid:  1645-1917mL of fluid (25-13mL/kg)  EDUCATION NEEDS:   No education needs identified at this  time   [place], RD, LDN Pager [phone]

## 2015-01-08 NOTE — Progress Notes (Signed)
Dr. Benjie Karvonen notified of critical Value of sodium 113.

## 2015-01-08 NOTE — Progress Notes (Signed)
Patient brought down for ERCP. The patient could not be intubated by anesthesia due to postsurgical anatomy from his tonsillar cancer. The patient was woken up without having the procedure done. The patient will be set up for a percutaneous hepatic drain. Both the family and explained the plan with them.   Lucilla Lame, MD

## 2015-01-08 NOTE — Progress Notes (Signed)
MEDICATION RELATED CONSULT NOTE - INITIAL   Pharmacy Consult for hypertonic saline Indication: hyponatremia  No Known Allergies  Patient Measurements: Height: 5\' 5"  (165.1 cm) Weight: 145 lb (65.772 kg) IBW/kg (Calculated) : 61.5 Adjusted Body Weight: 65.8 kg  Vital Signs: Temp: 97.9 F (36.6 C) (09/29 0525) Temp Source: Oral (09/29 0525) BP: 115/58 mmHg (09/29 0525) Pulse Rate: 65 (09/29 0525) Intake/Output from previous day: 09/28 0701 - 09/29 0700 In: 240 [P.O.:240] Out: 125 [Urine:125] Intake/Output from this shift: Total I/O In: -  Out: 125 [Urine:125]  Labs:  Recent Labs  01/06/15 1033 01/07/15 1149 01/07/15 1953 01/08/15 0428  WBC 9.2 8.2 8.2 7.6  HGB  --  10.1* 9.8* 8.9*  HCT 31.1* 29.5* 27.6* 26.3*  PLT  --  471* 450* 427  CREATININE 1.23 0.84 0.88 0.90  ALBUMIN  --  2.4*  --  2.1*  PROT 5.8* 6.4*  --  5.7*  AST 199* 189*  --  165*  ALT 168* 149*  --  130*  ALKPHOS 978* 871*  --  786*  BILITOT 18.4* 19.7*  --  18.5*  BILIDIR  --  12.7*  --   --    Estimated Creatinine Clearance: 57.9 mL/min (by C-G formula based on Cr of 0.9).   Microbiology: Recent Results (from the past 720 hour(s))  Urine Culture     Status: None   Collection Time: 01/05/15 12:00 AM  Result Value Ref Range Status   Urine Culture, Routine Final report  Final   Urine Culture result 1 No growth  Final    Medical History: Past Medical History  Diagnosis Date  . Hyperlipidemia   . 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 Hypertension   . Diabetes mellitus without complication   . CAD (coronary artery disease)   . Vitamin D deficiency   . AAA (abdominal aortic aneurysm)     Medications:  Infusions:  . sodium chloride (hypertonic) 25 mL/hr (01/08/15 0141)    Assessment: 81 yom cc weakness with history of cancer sp chemo/radiation approx 2 years ago. Presents with dark urine and elevated bilirubin. Na 111 on admission, baseline low 130s. Has been on 100 mL/hr  normal saline and serum sodium isn't responding, pharmacy consulted for hypertonic saline dosing.  Goal of Therapy:  Sodium 135 mEq/L  Plan:  Hypertonic sodium chloride 25 mL/hr, will check serum sodium level every 4 hours and adjust dose as needed to avoid correction of greater than 8 mEq in 24 hours.   3007 0428 Na 113 will continue current rate with goal correction of 8 mEq in 24 hours.   Laural Benes, Pharm.D. Clinical Pharmacist 01/08/2015,5:37 AM

## 2015-01-08 NOTE — Anesthesia Postprocedure Evaluation (Signed)
  Anesthesia Post-op Note  Patient: Geoffrey Gregory  Procedure(s) Performed: Procedure(s): ENDOSCOPIC RETROGRADE CHOLANGIOPANCREATOGRAPHY (ERCP) WITH PROPOFOL (N/A)  Anesthesia type:General  Patient location: PACU  Post pain: Pain level controlled  Post assessment: Post-op Vital signs reviewed, Patient's Cardiovascular Status Stable, Respiratory Function Stable, Patent Airway and No signs of Nausea or vomiting  Post vital signs: Reviewed and stable  Last Vitals:  Filed Vitals:   01/08/15 1824  BP:   Pulse:   Temp: 36.6 C  Resp:     Level of consciousness: awake, alert  and patient cooperative  Complications: No apparent anesthesia complications The procedure was aborted secondary to the inability to intubate the patient, despite adequate mask ventilation.  A miller, Mac, and glidescope/ Mcgrath were used with poor visualization and potential peri-glottic masses.  Saturations remained high, but the patient was allowed to wake up and the case aborted to maintain good vital signs and not lose the airway in a patient with an already compromised Na+ status.  Patient was brought to the RR awake, conversing and VSS

## 2015-01-08 NOTE — Anesthesia Preprocedure Evaluation (Deleted)
Anesthesia Evaluation    Airway        Dental   Pulmonary former smoker,           Cardiovascular hypertension,      Neuro/Psych    GI/Hepatic   Endo/Other  diabetes  Renal/GU      Musculoskeletal   Abdominal   Peds  Hematology   Anesthesia Other Findings   Reproductive/Obstetrics                             Anesthesia Physical Anesthesia Plan Anesthesia Quick Evaluation  

## 2015-01-08 NOTE — Consult Note (Signed)
Date: 01/08/2015                  Patient Name:  Geoffrey Gregory  MRN: [id number]  DOB: 1932-12-19  Age / Sex: 81 y.o., male         PCP: [name], MD                 Service Requesting Consult: Internal medicine                 Reason for Consult: hyponatremia            History of Present Illness: Patient is a 81 y.o. male with medical problems of hypertension, hyperlipidemia, history of left tonsillar cancer, who was admitted to [facility] on 01/07/2015 for evaluation of decreased energy and dark urine. He has noticed the symptoms for the posterior days. Upon admission, patient is found to have a pancreatic mass which is compressing on the common bile duct causing a rise in the bilirubin level.  Patient reports he does not have any pain. Appetite has been low. He was also noted to have significantly normal sodium level of 112 at the time of admission. Overnight, he was started on 3% saline.  This morning, her sodium level is 113.  Nephrology consult has been requested for evaluation.   Medications: Outpatient medications: Prescriptions prior to admission  Medication Sig Dispense Refill Last Dose  . acetaminophen (TYLENOL) 500 MG tablet Take 500 mg by mouth every 6 (six) hours as needed.   Taking  . aspirin 81 MG tablet Take by mouth daily.   01/07/2015 at Unknown time  . Cholecalciferol (VITAMIN D3) 1000 UNITS CAPS Take 1 capsule by mouth daily.   01/07/2015 at Unknown time  . doxycycline (VIBRA-TABS) 100 MG tablet Take 1 tablet (100 mg total) by mouth 2 (two) times daily. 20 tablet 0 01/07/2015 at Unknown time  . hydrochlorothiazide (HYDRODIURIL) 25 MG tablet Take by mouth daily.   01/07/2015 at Unknown time  . lovastatin (MEVACOR) 40 MG tablet Take 1 tablet (40 mg total) by mouth at bedtime. 30 tablet 6 01/06/2015 at Unknown time  . metoprolol (LOPRESSOR) 50 MG tablet Take 1 tablet (50 mg total) by mouth 2 (two) times daily. 60 tablet 6 01/07/2015 at Unknown time  . omeprazole  (PRILOSEC) 40 MG capsule Take 40 mg by mouth daily.    01/07/2015 at Unknown time    Current medications: Current Facility-Administered Medications  Medication Dose Route Frequency Provider Last Rate Last Dose  . alteplase (CATHFLO ACTIVASE) injection 2 mg  2 mg Intracatheter Once PRN Theodoro Grist, MD      . aspirin EC tablet 81 mg  81 mg Oral Daily Theodoro Grist, MD   81 mg at 01/07/15 1836  . heparin injection 5,000 Units  5,000 Units Subcutaneous 3 times per day Theodoro Grist, MD   5,000 Units at 01/07/15 1836  . heparin lock flush 100 unit/mL  500 Units Intravenous Once Theodoro Grist, MD   500 Units at 01/07/15 2007  . insulin aspart (novoLOG) injection 0-5 Units  0-5 Units Subcutaneous QHS Theodoro Grist, MD   0 Units at 01/07/15 2240  . insulin aspart (novoLOG) injection 0-9 Units  0-9 Units Subcutaneous TID WC Theodoro Grist, MD   0 Units at 01/07/15 1721  . metoprolol (LOPRESSOR) tablet 50 mg  50 mg Oral BID Theodoro Grist, MD   50 mg at 01/08/15 1041  . ondansetron (ZOFRAN) tablet 4 mg  4 mg  Oral Q6H PRN Theodoro Grist, MD       Or  . ondansetron (ZOFRAN) injection 4 mg  4 mg Intravenous Q6H PRN Theodoro Grist, MD      . pantoprazole (PROTONIX) EC tablet 40 mg  40 mg Oral Daily Theodoro Grist, MD   40 mg at 01/07/15 1808  . sodium chloride (hypertonic) 3 % solution   Intravenous Continuous Theodoro Grist, MD 25 mL/hr at 01/08/15 0141 25 mL/hr at 01/08/15 0141  . sodium chloride 0.9 % injection 10 mL  10 mL Intravenous PRN Theodoro Grist, MD       Facility-Administered Medications Ordered in Other Encounters  Medication Dose Route Frequency Provider Last Rate Last Dose  . sodium chloride 0.9 % injection 10 mL  10 mL Intravenous PRN Evlyn Kanner, NP   10 mL at 12/05/14 1103      Allergies: No Known Allergies    Past Medical History: Past Medical History  Diagnosis Date  . Hyperlipidemia   . 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 Hypertension   . Diabetes mellitus  without complication   . CAD (coronary artery disease)   . Vitamin D deficiency   . AAA (abdominal aortic aneurysm)      Past Surgical History: Past Surgical History  Procedure Laterality Date  . Portacath placement  Fe 2015    [name]  . Axillary lymph node biopsy Left 11-28-13    atyical squamous cells  . Carotid stent  1999  . Hernia repair Bilateral 10-06-00    inguinal/ Dr. [name]  . Lymph node dissection Left 12-30-13    axilla     Family History: Family History  Problem Relation Age of Onset  . Diabetes Mother   . Heart disease Mother   . Stroke Sister   . Kidney disease Sister     kidney failure  . Hypertension Brother      Social History: Social History   Social History  . Marital Status: Married    Spouse Name: N/A  . Number of Children: N/A  . Years of Education: N/A   Occupational History  . Not on file.   Social History Main Topics  . Smoking status: Former Smoker -- 1.00 packs/day for 15 years    Types: Cigarettes    Quit date: 04/11/1997  . Smokeless tobacco: Never Used  . Alcohol Use: No  . Drug Use: No  . Sexual Activity: Not on file   Other Topics Concern  . Not on file   Social History Narrative     Review of Systems: Gen: low energy level no fevers or chills,  HEENT: denies any complaints CV: no history of any cardiac problems Resp: denies any lung problems or breathing troubles BP:ZWCHENIDP appetite, no pain GU : dark urine, otherwise no previous problems reported MS: no problems reported Derm:  no problems reported Psych:no complaints Heme: no complaints Neuro: no complaints Endocrineno complaints  Vital Signs: Blood pressure 119/60, pulse 63, temperature 98.4 F (36.9 C), temperature source Oral, resp. rate 18, height 5\' 5"  (1.651 m), weight 65.772 kg (145 lb), SpO2 99 %.   Intake/Output Summary (Last 24 hours) at 01/08/15 1155 Last data filed at 01/08/15 0150  Gross per 24 hour  Intake    240 ml  Output    125  ml  Net    115 ml    Weight trends: [facility] Weights   01/07/15 1124 01/07/15 1543  Weight: 69.4 kg (153 lb) 65.772  kg (145 lb)    Physical Exam: General:  no acute distress, laying in the bed  HEENT Pupils are round, moist because membranes  Neck:  supple  Lungs: Clear to auscultation bilaterally, normal effort  Heart::  regular rhythm, no rub or gallop  Abdomen: Soft, nontender, nondistended  Extremities:  no pedal edema  Neurologic: Alert, oriented, speech normal, nonfocal  Skin: No acute rashes  Access: Right IJ port  Foley:        Lab results: Basic Metabolic Panel:  Recent Labs Lab 01/06/15 1033 01/07/15 1149  01/07/15 1953 01/08/15 0003 01/08/15 0428 01/08/15 0857  NA 111* 111*  < > 112* 112* 113* 113*  K 5.0 4.6  --   --   --  4.2  --   CL 73* 77*  --   --   --  83*  --   CO2 22 24  --   --   --  23  --   GLUCOSE 178* 176*  --   --   --  101*  --   BUN 21 25*  --   --   --  24*  --   CREATININE 1.23 0.84  --  0.88  --  0.90  --   CALCIUM 8.8 8.3*  --   --   --  7.8*  --   < > = values in this interval not displayed.  Liver Function Tests:  Recent Labs Lab 01/08/15 0428  AST 165*  ALT 130*  ALKPHOS 786*  BILITOT 18.5*  PROT 5.7*  ALBUMIN 2.1*    Recent Labs Lab 01/07/15 1149  LIPASE 739*   No results for input(s): AMMONIA in the last 168 hours.  CBC:  Recent Labs Lab 01/06/15 1033  01/07/15 1149 01/07/15 1953 01/08/15 0428  WBC 9.2  < > 8.2 8.2 7.6  NEUTROABS 7.0  --  6.9*  --   --   HGB  --   < > 10.1* 9.8* 8.9*  HCT 31.1*  < > 29.5* 27.6* 26.3*  MCV  --   < > 82.7 83.0 83.3  PLT  --   < > 471* 450* 427  < > = values in this interval not displayed.  Cardiac Enzymes: No results for input(s): CKTOTAL, TROPONINI in the last 168 hours.  BNP: Invalid input(s): POCBNP  CBG:  Recent Labs Lab 01/07/15 1607 01/07/15 2123 01/08/15 0732 01/08/15 1121  GLUCAP 117* 127* 107* 104*    Microbiology: Recent Results (from the past  720 hour(s))  Urine Culture     Status: None   Collection Time: 01/05/15 12:00 AM  Result Value Ref Range Status   Urine Culture, Routine Final report  Final   Urine Culture result 1 No growth  Final     Coagulation Studies: No results for input(s): LABPROT, INR in the last 72 hours.  Urinalysis:  Recent Labs  01/05/15 1658  BILIRUBINUR small  PROTEINUR small  UROBILINOGEN 0.2  NITRITE negative  LEUKOCYTESUR Trace*      Imaging: Mr Abd W/wo Cm/mrcp  01/07/2015   CLINICAL DATA:  Biliary obstruction.  Jaundice  EXAM: MRI ABDOMEN WITHOUT AND WITH CONTRAST (INCLUDING MRCP)  TECHNIQUE: Multiplanar multisequence MR imaging of the abdomen was performed both before and after the administration of intravenous contrast. Heavily T2-weighted images of the biliary and pancreatic ducts were obtained, and three-dimensional MRCP images were rendered by post processing.  CONTRAST:  36mL MULTIHANCE GADOBENATE DIMEGLUMINE 529 MG/ML IV SOLN  COMPARISON:  05/06/2014  FINDINGS: Lower chest: Small bilateral pleural effusions are identified. New from previous study.  Hepatobiliary: No focal liver lesions identified. Marked intrahepatic bile duct dilatation. The extrahepatic common bile duct measured 1 cm. Abrupt cut off of the common bile duct is identified.  Pancreas: There is an ill defined mass arising from the head of pancreas which measures approximately 4.1 x 3.6 x 4.6 cm. The mass completely encases the portal venous confluence with significant diminished caliber of the portal vein. Branches of the celiac trunk appear encased by this mass. The SMA appears patent an unaffected.  Spleen: Normal appearance of the spleen.  Adrenals/Urinary Tract: The adrenal glands are unremarkable. Bilateral renal cysts noted.  Stomach/Bowel: The stomach and the upper abdominal bowel loops are unremarkable.  Vascular/Lymphatic: Status post repair of infrarenal abdominal aortic aneurysm. The aneurysm sac measures 4.6 cm, image  7 of series 13. Previously 5.5 cm. No retroperitoneal adenopathy identified.  Other: There is a small amount of ascites identified within the upper abdomen.  Musculoskeletal: No specific findings to suggest bone metastasis.  IMPRESSION: 1. Large mass arising from the head of pancreas is identified worrisome for primary pancreatic neoplasm. 2. There is complete obstruction of the common bile duct. The portal venous confluence is completely encased by the lesion. There is also involvement of a branches of the celiac trunk. 3. No specific findings identified to suggest liver metastasis.   Electronically Signed   By: Kerby Moors M.D.   On: 01/07/2015 17:53   US Abdomen Limited Ruq  01/07/2015   CLINICAL DATA:  Elevated liver function studies, history of diabetes, alcohol dependence in remission, coronary artery disease.  EXAM: US ABDOMEN LIMITED - RIGHT UPPER QUADRANT  COMPARISON:  Abdominal pelvic CT scan of December 20, 2013  FINDINGS: Gallbladder:  The gallbladder is adequately distended. There is echogenic bile versus tiny floating stones within the gallbladder lumen. There is no gallbladder wall thickening, pericholecystic fluid, or positive sonographic Murphy's sign.  Common bile duct:  Diameter: 10 mm  Liver:  The intrahepatic ducts are dilated. No discrete hepatic mass is observed. There is a hypoechoic mass associated with the inferior aspect of the pancreatic head. There is ascites.  IMPRESSION: Suspicious masslike lesion in the region of the pancreatic head resulting in biliary ductal dilation. Possible tiny gallstones versus echogenic bile.  Abdominal MRI or CT scanning is recommended.   Electronically Signed   By: David  Martinique M.D.   On: 01/07/2015 14:03      Assessment & Plan: Pt is a 81 y.o. yo male with a PMHX of hypertension, hyperlipidemia, history of left tonsillar cancer,, was admitted on 01/07/2015 with hyponatremia as found to have a mass arising from head of pancreas Worrisome for  neoplasm and complete obstruction of common bile duct.   1. Acute hyponatremia.  Baseline sodium is not known.  Patient has not had any mental status changes or seizures.  He was started on IV 3% saline overnight.  This morning his sodium level is still 113.  Will give it a little more time.  Will continue to monitor  Sodium level closely. 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 If the level does not improve by tomorrow morning, will consider Samsca  2. Other- pancreatic mass, obstruction of common bile duct

## 2015-01-08 NOTE — Plan of Care (Signed)
Problem: Discharge Progression Outcomes Goal: Other Discharge Outcomes/Goals Outcome: Progressing Patient going for ERCP this morning has been NPO since midnight for new pancreatic mass, VSS, sodium continues to be low started on 3% sodium with sodium draws every 4 hours, cardiac monitoring because of getting 3 % sodium running sinus rhythm,  Port accessed in right chest without difficulty all labs to be drawn from port.

## 2015-01-08 NOTE — Progress Notes (Signed)
MEDICATION RELATED CONSULT NOTE - INITIAL   Pharmacy Consult for hypertonic saline Indication: hyponatremia  No Known Allergies  Patient Measurements: Height: 5\' 5"  (165.1 cm) Weight: 145 lb (65.772 kg) IBW/kg (Calculated) : 61.5 Adjusted Body Weight: 65.8 kg  Vital Signs: Temp: 98.4 F (36.9 C) (09/29 0829) Temp Source: Oral (09/29 0829) BP: 119/60 mmHg (09/29 0829) Pulse Rate: 63 (09/29 0829) Intake/Output from previous day: 09/28 0701 - 09/29 0700 In: 240 [P.O.:240] Out: 125 [Urine:125] Intake/Output from this shift:    Labs:  Recent Labs  01/06/15 1033 01/07/15 1149 01/07/15 1953 01/08/15 0428  WBC 9.2 8.2 8.2 7.6  HGB  --  10.1* 9.8* 8.9*  HCT 31.1* 29.5* 27.6* 26.3*  PLT  --  471* 450* 427  CREATININE 1.23 0.84 0.88 0.90  ALBUMIN  --  2.4*  --  2.1*  PROT 5.8* 6.4*  --  5.7*  AST 199* 189*  --  165*  ALT 168* 149*  --  130*  ALKPHOS 978* 871*  --  786*  BILITOT 18.4* 19.7*  --  18.5*  BILIDIR  --  12.7*  --   --    Estimated Creatinine Clearance: 57.9 mL/min (by C-G formula based on Cr of 0.9).   Microbiology: Recent Results (from the past 720 hour(s))  Urine Culture     Status: None   Collection Time: 01/05/15 12:00 AM  Result Value Ref Range Status   Urine Culture, Routine Final report  Final   Urine Culture result 1 No growth  Final    Medical History: Past Medical History  Diagnosis Date  . Hyperlipidemia   . 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 Hypertension   . Diabetes mellitus without complication   . CAD (coronary artery disease)   . Vitamin D deficiency   . AAA (abdominal aortic aneurysm)     Medications:  Infusions:  . sodium chloride (hypertonic) 25 mL/hr (01/08/15 0141)    Assessment: 81 yom cc weakness with history of cancer sp chemo/radiation approx 2 years ago. Presents with dark urine and elevated bilirubin. Na 111 on admission, baseline low 130s. Has been on 100 mL/hr normal saline and serum sodium isn't  responding, pharmacy consulted to monitor hypertonic saline. Nephrology consult pending  Goal of Therapy:  Sodium 135 mEq/L  Plan:  Hypertonic sodium chloride 25 mL/hr, will check serum sodium level every 4 hours and adjust dose as needed to avoid correction of greater than 8 mEq in 24 hours.   [id number] Na 113 will continue current rate with goal correction of 8 mEq in 24 hours. [phone] Na 113 will continue current rate with goal correction of 8 mEq in 24 hours.   Will recheck sodium in 4 hours.  Rexene Edison, PharmD Clinical Pharmacist 01/08/2015 9:34 AM

## 2015-01-08 NOTE — Progress Notes (Signed)
MEDICATION RELATED CONSULT NOTE - INITIAL   Pharmacy Consult for hypertonic saline Indication: hyponatremia  No Known Allergies  Patient Measurements: Height: 5\' 5"  (165.1 cm) Weight: 145 lb (65.772 kg) IBW/kg (Calculated) : 61.5 Adjusted Body Weight: 65.8 kg  Vital Signs: Temp: 97.9 F (36.6 C) (09/28 2101) Temp Source: Oral (09/28 2101) BP: 116/61 mmHg (09/28 2101) Pulse Rate: 62 (09/28 2101) Intake/Output from previous day: 09/28 0701 - 09/29 0700 In: 240 [P.O.:240] Out: -  Intake/Output from this shift:    Labs:  Recent Labs  01/06/15 1033 01/07/15 1149 01/07/15 1953  WBC 9.2 8.2 8.2  HGB  --  10.1* 9.8*  HCT 31.1* 29.5* 27.6*  PLT  --  471* 450*  CREATININE 1.23 0.84 0.88  ALBUMIN  --  2.4*  --   PROT 5.8* 6.4*  --   AST 199* 189*  --   ALT 168* 149*  --   ALKPHOS 978* 871*  --   BILITOT 18.4* 19.7*  --   BILIDIR  --  12.7*  --    Estimated Creatinine Clearance: 59.2 mL/min (by C-G formula based on Cr of 0.88).   Microbiology: Recent Results (from the past 720 hour(s))  Urine Culture     Status: None   Collection Time: 01/05/15 12:00 AM  Result Value Ref Range Status   Urine Culture, Routine Final report  Final   Urine Culture result 1 No growth  Final    Medical History: Past Medical History  Diagnosis Date  . Hyperlipidemia   . 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 Hypertension   . Diabetes mellitus without complication   . CAD (coronary artery disease)   . Vitamin D deficiency   . AAA (abdominal aortic aneurysm)     Medications:  Infusions:  . sodium chloride (hypertonic)      Assessment: 81 yom cc weakness with history of cancer sp chemo/radiation approx 2 years ago. Presents with dark urine and elevated bilirubin. Na 111 on admission, baseline low 130s. Has been on 100 mL/hr normal saline and serum sodium isn't responding, pharmacy consulted for hypertonic saline dosing.  Goal of Therapy:  Sodium 135 mEq/L  Plan:   Hypertonic sodium chloride 25 mL/hr, will check serum sodium level every 4 hours and adjust dose as needed to avoid correction of greater than 8 mEq in 24 hours.   Laural Benes, Pharm.D. Clinical Pharmacist 01/08/2015,1:36 AM

## 2015-01-09 ENCOUNTER — Encounter: Payer: Self-pay | Admitting: Gastroenterology

## 2015-01-09 ENCOUNTER — Inpatient Hospital Stay (HOSPITAL_COMMUNITY)
Admission: AD | Admit: 2015-01-09 | Discharge: 2015-01-22 | DRG: 408 | Disposition: A | Discharge status: Home / Self Care | Payer: Medicare HMO | Source: Other Acute Inpatient Hospital | Attending: Internal Medicine | Admitting: Internal Medicine

## 2015-01-09 DIAGNOSIS — Z7982 Long term (current) use of aspirin: Secondary | ICD-10-CM

## 2015-01-09 DIAGNOSIS — C250 Malignant neoplasm of head of pancreas: Secondary | ICD-10-CM | POA: Diagnosis present

## 2015-01-09 DIAGNOSIS — D630 Anemia in neoplastic disease: Secondary | ICD-10-CM | POA: Diagnosis present

## 2015-01-09 DIAGNOSIS — I2510 Atherosclerotic heart disease of native coronary artery without angina pectoris: Secondary | ICD-10-CM | POA: Diagnosis present

## 2015-01-09 DIAGNOSIS — D649 Anemia, unspecified: Secondary | ICD-10-CM | POA: Diagnosis not present

## 2015-01-09 DIAGNOSIS — Z833 Family history of diabetes mellitus: Secondary | ICD-10-CM

## 2015-01-09 DIAGNOSIS — E0781 Sick-euthyroid syndrome: Secondary | ICD-10-CM | POA: Diagnosis present

## 2015-01-09 DIAGNOSIS — E871 Hypo-osmolality and hyponatremia: Secondary | ICD-10-CM | POA: Diagnosis not present

## 2015-01-09 DIAGNOSIS — D638 Anemia in other chronic diseases classified elsewhere: Secondary | ICD-10-CM | POA: Diagnosis not present

## 2015-01-09 DIAGNOSIS — E222 Syndrome of inappropriate secretion of antidiuretic hormone: Secondary | ICD-10-CM | POA: Diagnosis present

## 2015-01-09 DIAGNOSIS — N179 Acute kidney failure, unspecified: Secondary | ICD-10-CM | POA: Diagnosis present

## 2015-01-09 DIAGNOSIS — E785 Hyperlipidemia, unspecified: Secondary | ICD-10-CM | POA: Diagnosis present

## 2015-01-09 DIAGNOSIS — Z923 Personal history of irradiation: Secondary | ICD-10-CM | POA: Diagnosis not present

## 2015-01-09 DIAGNOSIS — K219 Gastro-esophageal reflux disease without esophagitis: Secondary | ICD-10-CM | POA: Diagnosis present

## 2015-01-09 DIAGNOSIS — C801 Malignant (primary) neoplasm, unspecified: Secondary | ICD-10-CM

## 2015-01-09 DIAGNOSIS — K831 Obstruction of bile duct: Secondary | ICD-10-CM | POA: Diagnosis present

## 2015-01-09 DIAGNOSIS — Z79899 Other long term (current) drug therapy: Secondary | ICD-10-CM | POA: Diagnosis not present

## 2015-01-09 DIAGNOSIS — Z8249 Family history of ischemic heart disease and other diseases of the circulatory system: Secondary | ICD-10-CM

## 2015-01-09 DIAGNOSIS — R1312 Dysphagia, oropharyngeal phase: Secondary | ICD-10-CM | POA: Diagnosis present

## 2015-01-09 DIAGNOSIS — Z87891 Personal history of nicotine dependence: Secondary | ICD-10-CM | POA: Diagnosis not present

## 2015-01-09 DIAGNOSIS — C259 Malignant neoplasm of pancreas, unspecified: Secondary | ICD-10-CM | POA: Diagnosis not present

## 2015-01-09 DIAGNOSIS — E119 Type 2 diabetes mellitus without complications: Secondary | ICD-10-CM | POA: Diagnosis present

## 2015-01-09 DIAGNOSIS — E039 Hypothyroidism, unspecified: Secondary | ICD-10-CM | POA: Diagnosis present

## 2015-01-09 DIAGNOSIS — Z9221 Personal history of antineoplastic chemotherapy: Secondary | ICD-10-CM | POA: Diagnosis not present

## 2015-01-09 DIAGNOSIS — I714 Abdominal aortic aneurysm, without rupture: Secondary | ICD-10-CM | POA: Diagnosis present

## 2015-01-09 DIAGNOSIS — E7800 Pure hypercholesterolemia, unspecified: Secondary | ICD-10-CM | POA: Diagnosis present

## 2015-01-09 DIAGNOSIS — C099 Malignant neoplasm of tonsil, unspecified: Secondary | ICD-10-CM | POA: Diagnosis present

## 2015-01-09 DIAGNOSIS — I10 Essential (primary) hypertension: Secondary | ICD-10-CM | POA: Diagnosis present

## 2015-01-09 DIAGNOSIS — K8689 Other specified diseases of pancreas: Secondary | ICD-10-CM

## 2015-01-09 DIAGNOSIS — E876 Hypokalemia: Secondary | ICD-10-CM | POA: Diagnosis present

## 2015-01-09 DIAGNOSIS — R591 Generalized enlarged lymph nodes: Secondary | ICD-10-CM | POA: Diagnosis present

## 2015-01-09 DIAGNOSIS — E559 Vitamin D deficiency, unspecified: Secondary | ICD-10-CM | POA: Diagnosis present

## 2015-01-09 DIAGNOSIS — K838 Other specified diseases of biliary tract: Secondary | ICD-10-CM | POA: Diagnosis not present

## 2015-01-09 LAB — SODIUM
Sodium: 121 mmol/L — ABNORMAL LOW (ref 135–145)
Sodium: 120 mmol/L — ABNORMAL LOW (ref 135–145)

## 2015-01-09 LAB — COMPREHENSIVE METABOLIC PANEL
ALBUMIN: 2.2 g/dL — AB (ref 3.5–5.0)
ALBUMIN: 2 g/dL — AB (ref 3.5–5.0)
ALT: 147 U/L — ABNORMAL HIGH (ref 17–63)
ALT: 165 U/L — ABNORMAL HIGH (ref 17–63)
ANION GAP: 9 (ref 5–15)
ANION GAP: 8 (ref 5–15)
AST: 214 U/L — ABNORMAL HIGH (ref 15–41)
AST: 247 U/L — AB (ref 15–41)
Alkaline Phosphatase: 916 U/L — ABNORMAL HIGH (ref 38–126)
Alkaline Phosphatase: 868 U/L — ABNORMAL HIGH (ref 38–126)
BILIRUBIN TOTAL: 21.5 mg/dL — AB (ref 0.3–1.2)
BILIRUBIN TOTAL: 20.9 mg/dL — AB (ref 0.3–1.2)
BUN: 25 mg/dL — ABNORMAL HIGH (ref 6–20)
BUN: 20 mg/dL (ref 6–20)
CHLORIDE: 94 mmol/L — AB (ref 101–111)
CO2: 21 mmol/L — ABNORMAL LOW (ref 22–32)
CO2: 21 mmol/L — ABNORMAL LOW (ref 22–32)
Calcium: 8.1 mg/dL — ABNORMAL LOW (ref 8.9–10.3)
Calcium: 8.6 mg/dL — ABNORMAL LOW (ref 8.9–10.3)
Chloride: 86 mmol/L — ABNORMAL LOW (ref 101–111)
Creatinine, Ser: 0.95 mg/dL (ref 0.61–1.24)
Creatinine, Ser: 1.22 mg/dL (ref 0.61–1.24)
GFR calc Af Amer: >60 mL/min (ref >60)
GFR calc Af Amer: >60 mL/min (ref >60)
GFR calc non Af Amer: >60 mL/min (ref >60)
GFR calc non Af Amer: 55 mL/min — ABNORMAL LOW (ref >60)
GLUCOSE: 203 mg/dL — AB (ref 65–99)
GLUCOSE: 149 mg/dL — AB (ref 65–99)
POTASSIUM: 4.1 mmol/L (ref 3.5–5.1)
Potassium: 4.1 mmol/L (ref 3.5–5.1)
SODIUM: 116 mmol/L — AB (ref 135–145)
Sodium: 123 mmol/L — ABNORMAL LOW (ref 135–145)
TOTAL PROTEIN: 6.2 g/dL — AB (ref 6.5–8.1)
TOTAL PROTEIN: 5.5 g/dL — AB (ref 6.5–8.1)

## 2015-01-09 LAB — CBC WITH DIFFERENTIAL/PLATELET
BASOS ABS: 0 10*3/uL (ref 0.0–0.1)
BASOS PCT: 0 %
EOS ABS: 0 10*3/uL (ref 0.0–0.7)
EOS PCT: 0 %
HCT: 25.3 % — ABNORMAL LOW (ref 39.0–52.0)
Hemoglobin: 9 g/dL — ABNORMAL LOW (ref 13.0–17.0)
Lymphocytes Relative: 4 %
Lymphs Abs: 0.5 10*3/uL — ABNORMAL LOW (ref 0.7–4.0)
MCH: 28 pg (ref 26.0–34.0)
MCHC: 35.6 g/dL (ref 30.0–36.0)
MCV: 78.6 fL (ref 78.0–100.0)
MONO ABS: 0.7 10*3/uL (ref 0.1–1.0)
MONOS PCT: 5 %
Neutro Abs: 13.5 10*3/uL — ABNORMAL HIGH (ref 1.7–7.7)
Neutrophils Relative %: 91 %
PLATELETS: 449 10*3/uL — AB (ref 150–400)
RBC: 3.22 MIL/uL — ABNORMAL LOW (ref 4.22–5.81)
RDW: 17.1 % — AB (ref 11.5–15.5)
WBC: 14.7 10*3/uL — ABNORMAL HIGH (ref 4.0–10.5)

## 2015-01-09 LAB — GLUCOSE, CAPILLARY
GLUCOSE-CAPILLARY: 175 mg/dL — AB (ref 65–99)
GLUCOSE-CAPILLARY: 147 mg/dL — AB (ref 65–99)
Glucose-Capillary: 178 mg/dL — ABNORMAL HIGH (ref 65–99)

## 2015-01-09 LAB — APTT: aPTT: 25 seconds (ref 24–36)

## 2015-01-09 LAB — BASIC METABOLIC PANEL
Anion gap: 9 (ref 5–15)
BUN: 26 mg/dL — AB (ref 6–20)
CHLORIDE: 89 mmol/L — AB (ref 101–111)
CO2: 22 mmol/L (ref 22–32)
CREATININE: 0.89 mg/dL (ref 0.61–1.24)
Calcium: 8.5 mg/dL — ABNORMAL LOW (ref 8.9–10.3)
GFR calc Af Amer: >60 mL/min (ref >60)
Glucose, Bld: 166 mg/dL — ABNORMAL HIGH (ref 65–99)
Potassium: 4.1 mmol/L (ref 3.5–5.1)
SODIUM: 120 mmol/L — AB (ref 135–145)

## 2015-01-09 LAB — T4, FREE: FREE T4: 1.24 ng/dL — AB (ref 0.61–1.12)

## 2015-01-09 LAB — PROTIME-INR
INR: 1.08
INR: 1.19 (ref 0.00–1.49)
PROTHROMBIN TIME: 14.2 s (ref 11.4–15.0)
PROTHROMBIN TIME: 15.3 s — AB (ref 11.6–15.2)

## 2015-01-09 LAB — CANCER ANTIGEN 19-9: CA 19 9: 418 U/mL — AB (ref 0–35)

## 2015-01-09 MED ORDER — SODIUM CHLORIDE 3 % IV SOLN
INTRAVENOUS | Status: DC
  Filled 2015-01-09 (×2): qty 500

## 2015-01-09 MED ORDER — ONDANSETRON HCL 4 MG/2ML IJ SOLN: 4.0000 mg | Freq: Four times a day (QID) | INTRAMUSCULAR | Status: DC | PRN

## 2015-01-09 MED ORDER — SODIUM CHLORIDE 3 % IV SOLN
INTRAVENOUS | Status: DC
  Administered 2015-01-09: 03:00:00 25 mL/h via INTRAVENOUS
  Filled 2015-01-09: qty 500

## 2015-01-09 MED ORDER — SODIUM CHLORIDE 3 % IV SOLN
INTRAVENOUS | Status: DC
Start: 2015-01-09 — End: 2015-01-10
  Administered 2015-01-09: 25 mL/h via INTRAVENOUS
  Filled 2015-01-09 (×3): qty 500

## 2015-01-09 MED ORDER — PANTOPRAZOLE SODIUM 40 MG PO TBEC
40.0000 mg | DELAYED_RELEASE_TABLET | Freq: Every day | ORAL | Status: DC
  Administered 2015-01-09 – 2015-01-22 (×14): 40 mg via ORAL
  Filled 2015-01-09 (×14): qty 1

## 2015-01-09 MED ORDER — ACETAMINOPHEN 325 MG PO TABS: 650.0000 mg | ORAL_TABLET | Freq: Four times a day (QID) | ORAL | Status: DC | PRN

## 2015-01-09 MED ORDER — SODIUM CHLORIDE 0.9 % IV SOLN
INTRAVENOUS | Status: DC
  Administered 2015-01-09: 14:00:00 via INTRAVENOUS

## 2015-01-09 MED ORDER — ACETAMINOPHEN 650 MG RE SUPP: 650.0000 mg | Freq: Four times a day (QID) | RECTAL | Status: DC | PRN

## 2015-01-09 MED ORDER — METOPROLOL TARTRATE 50 MG PO TABS
50.0000 mg | ORAL_TABLET | Freq: Two times a day (BID) | ORAL | Status: DC
  Administered 2015-01-09 – 2015-01-13 (×8): 50 mg via ORAL
  Filled 2015-01-09: qty 1
  Filled 2015-01-09: qty 2
  Filled 2015-01-09 (×6): qty 1
  Filled 2015-01-09: qty 2
  Filled 2015-01-09: qty 1

## 2015-01-09 MED ORDER — ONDANSETRON HCL 4 MG PO TABS: 4.0000 mg | ORAL_TABLET | Freq: Four times a day (QID) | ORAL | Status: DC | PRN

## 2015-01-09 MED ORDER — SODIUM CHLORIDE 0.9 % IJ SOLN
3.0000 mL | Freq: Two times a day (BID) | INTRAMUSCULAR | Status: DC
  Administered 2015-01-09 – 2015-01-11 (×4): 3 mL via INTRAVENOUS

## 2015-01-09 NOTE — H&P (Signed)
[facility] History and Physical  Patient: Geoffrey Gregory  MRN: [id number]  DOB: October 03, 1932  DOS: the patient was seen and examined on 01/09/2015 PCP: [name], MD  Referring physician: [facility] Chief Complaint: Jaundice  HPI: Geoffrey Gregory is a 81 y.o. 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 The patient is presenting with numbness of generalized weakness as well as fatigue. He also has complains of skin discoloration. Next and with this finding [facility] The patient was found to be significantly low sodium level and has been treated for hyponatremia with hypertonic saline. Patient was also found to be having significantly elevated levels of bilirubin as well as LFT and workup shows that he has a pancreatic loss with a possible biliary obstruction. Patient was seen by gastroenterology who recommended an ERCP. But due to his history of tonsillar cancer procedure was not able to be completed and therefore the patient was transferred here due to persistently elevated levels of bilirubin for percutaneous hepatic drain placement. At the time of my evaluation patient mentions he is feeling significantly better. He has mild itching. Next and denies any nausea vomiting abdominal pain chest pain shortness of breath fever or diarrhea constipation or burning urination.  The patient is coming from home.  At his baseline ambulates without supple And is independent for most of his ADL; manages his medication on his own.  Review of Systems: as mentioned in the history of present illness.  A comprehensive review of the other systems is negative.  Past Medical History  Diagnosis Date  . Hyperlipidemia   . 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 Hypertension   . Diabetes mellitus without complication    . CAD (coronary artery disease)   . Vitamin D deficiency   . AAA (abdominal aortic aneurysm)    Past Surgical History  Procedure Laterality Date  . Portacath placement  Fe 2015    [name]  . Axillary lymph node biopsy Left 11-28-13    atyical squamous cells  . Carotid stent  1999  . Hernia repair Bilateral 10-06-00    inguinal/ Dr. [name]  . Lymph node dissection Left 12-30-13    axilla  . Endoscopic retrograde cholangiopancreatography (ercp) with propofol N/A 01/08/2015    Procedure: ENDOSCOPIC RETROGRADE CHOLANGIOPANCREATOGRAPHY (ERCP) WITH PROPOFOL;  Surgeon: Lucilla Lame, MD;  Location: [facility] ENDOSCOPY;  Service: Endoscopy;  Laterality: N/A;   Social History:  reports that he quit smoking about 17 years ago. His smoking use included Cigarettes. He has a 15 pack-year smoking history. He has never used smokeless tobacco. He reports that he does not drink alcohol or use illicit drugs.  No Known Allergies  Family History  Problem Relation Age of Onset  . Diabetes Mother   . Heart disease Mother   . Stroke Sister   . Kidney disease Sister     kidney failure  . Hypertension Brother     Prior to Admission medications   Medication Sig Start Date End Date Taking? Authorizing Sanay Belmar  acetaminophen (TYLENOL) 500 MG tablet Take 500 mg by mouth every 6 (six) hours as needed.    Historical Deyci Gesell, MD  aspirin 81 MG tablet Take by mouth daily. 06/08/11   Historical Kanijah Groseclose, MD  metoprolol (LOPRESSOR) 50 MG tablet Take 1 tablet (50 mg total) by mouth 2 (two) times daily. 10/17/14  [name], MD  omeprazole (PRILOSEC) 40 MG capsule Take 40 mg by mouth daily.  12/11/13   Historical Neev Mcmains, MD    Physical Exam: Filed Vitals:   01/09/15 1945  BP: 143/57  Pulse: 67  Temp: 97.9 F (36.6 C)  TempSrc: Oral  Resp: 18  Height: 5\' 7"  (1.702 m)  Weight: 65.5 kg (144 lb 6.4 oz)  SpO2: 99%    General: Alert, Awake and Oriented to Time, Place and Person. Appear in mild  distress Eyes: PERRL ENT: Oral Mucosa clear moist. Neck: no JVD Cardiovascular: S1 and S2 Present, no Murmur, Peripheral Pulses Present Respiratory: Bilateral Air entry equal and Decreased,  Clear to Auscultation, no Crackles, no wheezes Abdomen: Bowel Sound present, Soft and no tenderness Skin: no Rash Extremities: no Pedal edema, no calf tenderness Neurologic: Grossly no focal neuro deficit.  Labs on Admission:  CBC:  Recent Labs Lab 01/06/15 1033 01/07/15 1149 01/07/15 1953 01/08/15 0428 01/09/15 2108  WBC 9.2 8.2 8.2 7.6 14.7*  NEUTROABS 7.0 6.9*  --   --  13.5*  HGB  --  10.1* 9.8* 8.9* 9.0*  HCT 31.1* 29.5* 27.6* 26.3* 25.3*  MCV  --  82.7 83.0 83.3 78.6  PLT  --  471* 450* 427 449*    CMP     Component Value Date/Time   NA 120* 01/09/2015 1637   NA 111* 01/06/2015 1033   NA 131* 08/06/2014 1015   K 4.1 01/09/2015 0653   K 4.4 08/06/2014 1015   CL 89* 01/09/2015 0653   CL 100* 08/06/2014 1015   CO2 22 01/09/2015 0653   CO2 27 08/06/2014 1015   GLUCOSE 166* 01/09/2015 0653   GLUCOSE 178* 01/06/2015 1033   GLUCOSE 161* 08/06/2014 1015   BUN 26* 01/09/2015 0653   BUN 21 01/06/2015 1033   BUN 32* 08/06/2014 1015   CREATININE 0.89 01/09/2015 0653   CREATININE 1.61* 08/06/2014 1015   CREATININE 1.5* 05/01/2014   CALCIUM 8.5* 01/09/2015 0653   CALCIUM 8.9 08/06/2014 1015   PROT 6.2* 01/09/2015 0156   PROT 5.8* 01/06/2015 1033   PROT 7.6 08/06/2014 1015   ALBUMIN 2.2* 01/09/2015 0156   ALBUMIN 4.0 08/06/2014 1015   AST 214* 01/09/2015 0156   AST 28 08/06/2014 1015   ALT 147* 01/09/2015 0156   ALT 19 08/06/2014 1015   ALKPHOS 916* 01/09/2015 0156   ALKPHOS 60 08/06/2014 1015   BILITOT 21.5* 01/09/2015 0156   BILITOT 18.4* 01/06/2015 1033   BILITOT 1.0 08/06/2014 1015   GFRNONAA >60 01/09/2015 0653   GFRNONAA 40* 08/06/2014 1015   GFRNONAA 45* 05/07/2014 1041   GFRAA >60 01/09/2015 0653   GFRAA 46* 08/06/2014 1015   GFRAA 54* 05/07/2014 1041    Assessment/Plan 1. Malignant obstructive jaundice The patient is presenting with numbness off obstructive jaundice. Most likely this is malignant in the setting of possible pancreatic mass possible pancreatic cancer. Gastroenterology has been consulted and the other facility. They recommended to obtain Vicodin is awaiting drain for the patient. I have discussed the case with Dr. Vernard Gambles interventionist on-call and will follow-up on the patient in the morning. Recommend to keep the patient nothing by mouth after midnight. Also recommended to avoid pharmaceutical DVT prophylaxis. Patient remains nothing by mouth after midnight. We will also discuss with GI in the morning as there was a plan to initiate a possible EUS.  2. Hyponatremia. 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 Although patient has been on the  hypertonic saline. Initially it was 45 mL/h later on it was reduced at 75 mL per hour. Currently I have discussed with Dr. Mercy Moore on the phone and I would continue with the hypertonic saline at 25 mL per hour. We'll monitor her BMP every 2 hours. Nephrology will be officially consulting on the patient in the morning. Check uric acid as well as serum and urine osmolality.  3  Essential (primary) hypertension Blood pressure is stable continue metoprolol.  4  Anemia Does not open to be having any acute bleeding. We'll continue to closely monitor. Most likely anemia of chronic disease.  5  Hyperbilirubinemia At present doesn't appear to be having any significant and nephropathy. Patient will be scheduled for percutaneous drain placement. Holding aspirin.  Nutrition: Nothing by mouth after midnight DVT Prophylaxis: mechanical compression device  Advance goals of care discussion: Full code as per my discussion with patient   Consults: Interventional radiology as well as nephrology  Family Communication: family was present at bedside, opportunity was given to ask  question and all questions were answered satisfactorily at the time of interview. Disposition: Admitted as inpatient, step-down unit.  Author: Berle Mull, MD [facility] Pager: [phone] 01/09/2015  If 7PM-7AM, please contact night-coverage [email or web address] Password [email or web address]

## 2015-01-09 NOTE — Treatment Plan (Addendum)
Discussed case with Dr. Benjie Karvonen  81yo, hx tonsillar cancer who presented with painless jaundice with large pancreatic mass on MRI. Initial plan for ERCP per GI however unable to intubate secondary to post-surgical anatomy secondary to prior tonsillar ca. Instead, plans for percutaneous drain per IR, however, not available at referring facility thus transfer requested to [facility]. Per report, IR at [facility] had d/w [facility] IR who plans to perform IR drain on arrival. Also consideration for EUS while at [facility]. Dr. Benjie Karvonen to discuss case with GI prior to transfer. Pt also hyponatremic, likely SIADH and currently improving on 3% saline, presently managed by nephrology at [facility].  Pt reportedly in stable condition. Accepted to med-surg. [facility] IR to be notified of pending transfer.

## 2015-01-09 NOTE — Progress Notes (Signed)
MEDICATION RELATED CONSULT NOTE - INITIAL   Pharmacy Consult for hypertonic saline Indication: hyponatremia  No Known Allergies  Patient Measurements: Height: 5\' 5"  (165.1 cm) Weight: 145 lb (65.772 kg) IBW/kg (Calculated) : 61.5 Adjusted Body Weight: 65.8 kg  Vital Signs: Temp: 98.5 F (36.9 C) (09/30 0524) Temp Source: Oral (09/30 0524) BP: 120/62 mmHg (09/30 1249) Pulse Rate: 72 (09/30 1249) Intake/Output from previous day: 09/29 0701 - 09/30 0700 In: 450 [I.V.:450] Out: -  Intake/Output from this shift: Total I/O In: 239 [I.V.:239] Out: -   Labs: Estimated Creatinine Clearance: 58.5 mL/min (by C-G formula based on Cr of 0.89).   Microbiology: Recent Results (from the past 720 hour(s))  Urine Culture     Status: None   Collection Time: 01/05/15 12:00 AM  Result Value Ref Range Status   Urine Culture, Routine Final report  Final   Urine Culture result 1 No growth  Final    Medical History: Past Medical History  Diagnosis Date  . Hyperlipidemia   . 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 Hypertension   . Diabetes mellitus without complication   . CAD (coronary artery disease)   . Vitamin D deficiency   . AAA (abdominal aortic aneurysm)     Medications:  Infusions:  . sodium chloride    . sodium chloride (hypertonic)      Assessment: 81 yom cc weakness with history of cancer sp chemo/radiation approx 2 years ago. Presents with dark urine and elevated bilirubin. Na 111 on admission, baseline low 130s. Has been on 100 mL/hr normal saline and serum sodium isn't responding, pharmacy consulted to monitor hypertonic saline. Nephrology following  Goal of Therapy:  Sodium 135 mEq/L  Plan:  Hypertonic sodium chloride 25 mL/hr, will check serum sodium level every 4 hours and adjust dose as needed to avoid correction of greater than 8 mEq in 24 hours.   7741 0428 Na 113 will continue current rate with goal correction of 8 mEq in 24 hours. 2878 0857 Na  113 will continue current rate with goal correction of 8 mEq in 24 hours.   6767 1258 Na 113 - nephrology following, rate increased to 43ml/hr. Will continue Q4H monitoring, with goal correction of 8 mEq in 24H  0929  1600 :   Na = 115 ,  Will continue hypertonic saline and recheck Na in 4 hrs.   0926  20:00 :  Na = 123,  This represents an increase in Na > 8 mEq in 24 hrs.  Will d/c hypertonic saline and continue                        to recheck Na every 4 hrs.   Will recheck sodium in 4 hours.  0930 0156 sodium 116, resumed 3% sodium chloride at 25 mL/hr. Will check sodium level in 4 hours  0930 0653 sodium: 120. Discussed with Dr. Candiss Norse as increase trending towards > 30mEq/24hr. Dr. Candiss Norse would like to continue current rate of 36ml/hr and continue to monitor  0930 1230 Na = 121, nephrology increased hypertonic saline to 85ml/hr. Will continue to monitor Q4H sodium.  Rexene Edison, PharmD Clinical Pharmacist  01/09/2015 1:25 PM

## 2015-01-09 NOTE — Progress Notes (Signed)
PT Cancellation Note  Patient Details Name: Geoffrey Gregory MRN: [id number] DOB: Jul 28, 1932   Cancelled Treatment:    Reason Eval/Treat Not Completed: Other (comment). Patient seen around 2 PM, will be undergoing ERCP at [facility] and has been NPO prior to this surgery. PT will hold on mobility evaluation until after surgery for more representative mobility status prior to discharge.   Kerman Passey, PT, DPT    01/09/2015, 1:57 PM

## 2015-01-09 NOTE — Addendum Note (Signed)
Addendum  created 01/09/15 0003 by Alvin Critchley, MD   Modules edited: Clinical Notes   Clinical Notes:  File: [id number]

## 2015-01-09 NOTE — Plan of Care (Signed)
Problem: Discharge Progression Outcomes Goal: Other Discharge Outcomes/Goals Outcome: Progressing Plan of care progress to goal 1. Pain: No complaints of pain. 2. Hemodynamics: Vital signs stable. Barriers to progression: pt on 3% hypertonic solution. Timed sodium q 6 hours. Last result was 702.  3. Complications resolved: Not progressing. Pt had critical sodium and total bilirubin. MD made aware. Next labs at 0700. Also, ERCP was not performed due to the inability to intubate pt. Pt for percutaneous transhepatic cholangiogram today. 4. Diet: Pt was on clear liquid diets up until midnight. Tolerated clears. NPO currently for procedure today.  5.Activity: progressing. Pt up with assist.

## 2015-01-09 NOTE — Progress Notes (Signed)
MEDICATION RELATED CONSULT NOTE - INITIAL   Pharmacy Consult for hypertonic saline Indication: hyponatremia  No Known Allergies  Patient Measurements: Height: 5\' 5"  (165.1 cm) Weight: 145 lb (65.772 kg) IBW/kg (Calculated) : 61.5 Adjusted Body Weight: 65.8 kg  Vital Signs: Temp: 98.5 F (36.9 C) (09/30 0524) Temp Source: Oral (09/30 0524) BP: 125/71 mmHg (09/30 0524) Pulse Rate: 71 (09/30 0524) Intake/Output from previous day: 09/29 0701 - 09/30 0700 In: 450 [I.V.:450] Out: -  Intake/Output from this shift:    Labs: Estimated Creatinine Clearance: 58.5 mL/min (by C-G formula based on Cr of 0.89).   Microbiology: Recent Results (from the past 720 hour(s))  Urine Culture     Status: None   Collection Time: 01/05/15 12:00 AM  Result Value Ref Range Status   Urine Culture, Routine Final report  Final   Urine Culture result 1 No growth  Final    Medical History: Past Medical History  Diagnosis Date  . Hyperlipidemia   . 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 Hypertension   . Diabetes mellitus without complication   . CAD (coronary artery disease)   . Vitamin D deficiency   . AAA (abdominal aortic aneurysm)     Medications:  Infusions:  . sodium chloride (hypertonic) 25 mL/hr (01/09/15 0302)    Assessment: 81 yom cc weakness with history of cancer sp chemo/radiation approx 2 years ago. Presents with dark urine and elevated bilirubin. Na 111 on admission, baseline low 130s. Has been on 100 mL/hr normal saline and serum sodium isn't responding, pharmacy consulted to monitor hypertonic saline. Nephrology following  Goal of Therapy:  Sodium 135 mEq/L  Plan:  Hypertonic sodium chloride 25 mL/hr, will check serum sodium level every 4 hours and adjust dose as needed to avoid correction of greater than 8 mEq in 24 hours.   0762 0428 Na 113 will continue current rate with goal correction of 8 mEq in 24 hours. 2633 0857 Na 113 will continue current rate with  goal correction of 8 mEq in 24 hours.   3545 1258 Na 113 - nephrology following, rate increased to 1ml/hr. Will continue Q4H monitoring, with goal correction of 8 mEq in 24H  0929  1600 :   Na = 115 ,  Will continue hypertonic saline and recheck Na in 4 hrs.   0926  20:00 :  Na = 123,  This represents an increase in Na > 8 mEq in 24 hrs.  Will d/c hypertonic saline and continue                        to recheck Na every 4 hrs.   Will recheck sodium in 4 hours.  0930 0156 sodium 116, resumed 3% sodium chloride at 25 mL/hr. Will check sodium level in 4 hours  0930 0653 sodium: 120. Discussed with Dr. Candiss Norse as increase trending towards > 41mEq/24hr. Dr. Candiss Norse would like to continue current rate of 29ml/hr and continue to monitor. Will recheck sodium in 4 hours  Rexene Edison, PharmD Clinical Pharmacist  01/09/2015 7:55 AM

## 2015-01-09 NOTE — Progress Notes (Signed)
Subjective:  Patient states that he is overall doing better. He feels better. Sodium level has improved to 120. Yesterday, his rate of fluid was increased to 45 cc/h at due to cost rate of rise of sodium, 3% saline was decreased back down to 25 cc per hour. No shortness of breath reported. No nausea or vomiting.   Objective:  Vital signs in last 24 hours:  Temp:  [97.5 F (36.4 C)-98.5 F (36.9 C)] 98.5 F (36.9 C) (09/30 0524) Pulse Rate:  [62-82] 71 (09/30 0524) Resp:  [18-20] 18 (09/30 0524) BP: (115-142)/(61-71) 125/71 mmHg (09/30 0524) SpO2:  [99 %-100 %] 99 % (09/30 0524)  Weight change:  Filed Weights   01/07/15 1124 01/07/15 1543  Weight: 69.4 kg (153 lb) 65.772 kg (145 lb)    Intake/Output:    Intake/Output Summary (Last 24 hours) at 01/09/15 1149 Last data filed at 01/08/15 1820  Gross per 24 hour  Intake    450 ml  Output      0 ml  Net    450 ml     Physical Exam: General:  laying in the bed, no acute distress   HEENT  moist oral mucous membranes   Neck  supple   Pulm/lungs  clear bilaterally   CVS/Heart  regular rhythm, no rub   Abdomen:   soft, nontender   Extremities:  no peripheral edema   Neurologic:  alert, nonfocal   Skin:  generalized jaundice   Access:  right IJ port        Basic Metabolic Panel:   Recent Labs Lab 01/07/15 1149  01/07/15 1953  01/08/15 0428 01/08/15 0858 01/08/15 1258 01/08/15 1618 01/08/15 1958 01/09/15 0156 01/09/15 0653  NA 111*  < > 112*  < > 113* 113* 113* 115* 123* 116* 120*  K 4.6  --   --   --  4.2 4.0  --   --   --  4.1 4.1  CL 77*  --   --   --  83* 81*  --   --   --  86* 89*  CO2 24  --   --   --  23 22  --   --   --  21* 22  GLUCOSE 176*  --   --   --  101* 97  --   --   --  203* 166*  BUN 25*  --   --   --  24* 23*  --   --   --  25* 26*  CREATININE 0.84  --  0.88  --  0.90 0.91  --   --   --  0.95 0.89  CALCIUM 8.3*  --   --   --  7.8* 7.8*  --   --   --  8.1* 8.5*  < > = values in this  interval not displayed.   CBC:  Recent Labs Lab 01/06/15 1033 01/07/15 1149 01/07/15 1953 01/08/15 0428  WBC 9.2 8.2 8.2 7.6  NEUTROABS 7.0 6.9*  --   --   HGB  --  10.1* 9.8* 8.9*  HCT 31.1* 29.5* 27.6* 26.3*  MCV  --  82.7 83.0 83.3  PLT  --  471* 450* 427      Microbiology:  Recent Results (from the past 720 hour(s))  Urine Culture     Status: None   Collection Time: 01/05/15 12:00 AM  Result Value Ref Range Status   Urine Culture, Routine Final report  Final  Urine Culture result 1 No growth  Final    Coagulation Studies: No results for input(s): LABPROT, INR in the last 72 hours.  Urinalysis: No results for input(s): COLORURINE, LABSPEC, PHURINE, GLUCOSEU, HGBUR, BILIRUBINUR, KETONESUR, PROTEINUR, UROBILINOGEN, NITRITE, LEUKOCYTESUR in the last 72 hours.  Invalid input(s): APPERANCEUR    Imaging: [name] W/wo Cm/mrcp  01/07/2015   CLINICAL DATA:  Biliary obstruction.  Jaundice  EXAM: MRI ABDOMEN WITHOUT AND WITH CONTRAST (INCLUDING MRCP)  TECHNIQUE: Multiplanar multisequence MR imaging of the abdomen was performed both before and after the administration of intravenous contrast. Heavily T2-weighted images of the biliary and pancreatic ducts were obtained, and three-dimensional MRCP images were rendered by post processing.  CONTRAST:  15mL MULTIHANCE GADOBENATE DIMEGLUMINE 529 MG/ML IV SOLN  COMPARISON:  05/06/2014  FINDINGS: Lower chest: Small bilateral pleural effusions are identified. New from previous study.  Hepatobiliary: No focal liver lesions identified. Marked intrahepatic bile duct dilatation. The extrahepatic common bile duct measured 1 cm. Abrupt cut off of the common bile duct is identified.  Pancreas: There is an ill defined mass arising from the head of pancreas which measures approximately 4.1 x 3.6 x 4.6 cm. The mass completely encases the portal venous confluence with significant diminished caliber of the portal vein. Branches of the celiac trunk appear  encased by this mass. The SMA appears patent an unaffected.  Spleen: Normal appearance of the spleen.  Adrenals/Urinary Tract: The adrenal glands are unremarkable. Bilateral renal cysts noted.  Stomach/Bowel: The stomach and the upper abdominal bowel loops are unremarkable.  Vascular/Lymphatic: Status post repair of infrarenal abdominal aortic aneurysm. The aneurysm sac measures 4.6 cm, image 7 of series 13. Previously 5.5 cm. No retroperitoneal adenopathy identified.  Other: There is a small amount of ascites identified within the upper abdomen.  Musculoskeletal: No specific findings to suggest bone metastasis.  IMPRESSION: 1. Large mass arising from the head of pancreas is identified worrisome for primary pancreatic neoplasm. 2. There is complete obstruction of the common bile duct. The portal venous confluence is completely encased by the lesion. There is also involvement of a branches of the celiac trunk. 3. No specific findings identified to suggest liver metastasis.   Electronically Signed   By: Kerby Moors M.D.   On: 01/07/2015 17:53   US Abdomen Limited Ruq  01/07/2015   CLINICAL DATA:  Elevated liver function studies, history of diabetes, alcohol dependence in remission, coronary artery disease.  EXAM: US ABDOMEN LIMITED - RIGHT UPPER QUADRANT  COMPARISON:  Abdominal pelvic CT scan of December 20, 2013  FINDINGS: Gallbladder:  The gallbladder is adequately distended. There is echogenic bile versus tiny floating stones within the gallbladder lumen. There is no gallbladder wall thickening, pericholecystic fluid, or positive sonographic Murphy's sign.  Common bile duct:  Diameter: 10 mm  Liver:  The intrahepatic ducts are dilated. No discrete hepatic mass is observed. There is a hypoechoic mass associated with the inferior aspect of the pancreatic head. There is ascites.  IMPRESSION: Suspicious masslike lesion in the region of the pancreatic head resulting in biliary ductal dilation. Possible tiny  gallstones versus echogenic bile.  Abdominal MRI or CT scanning is recommended.   Electronically Signed   By: David  Martinique M.D.   On: 01/07/2015 14:03     Medications:   . sodium chloride (hypertonic) 25 mL/hr (01/09/15 0302)   . aspirin EC  81 mg Oral Daily  . heparin lock flush  500 Units Intravenous Once  . insulin aspart  0-5 Units  Subcutaneous QHS  . insulin aspart  0-9 Units Subcutaneous TID WC  . metoprolol  50 mg Oral BID  . pantoprazole  40 mg Oral Daily   alteplase, ondansetron **OR** ondansetron (ZOFRAN) IV, sodium chloride  Assessment/ Plan:  81 y.o. male with a PMHX of hypertension, hyperlipidemia, history of left tonsillar cancer,, was admitted on 01/07/2015 with hyponatremia as found to have a mass arising from head of pancreas Worrisome for neoplasm and complete obstruction of common bile duct.   1. Acute hyponatremia. Baseline sodium is not known. Patient has not had any mental status changes or seizures. He was started on IV 3% saline overnight. This morning his sodium level is still 113. Will give it a little more time. Will continue to monitor Sodium level closely. 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 Sodium level has improved to 120 We'll increase the rate of 3% saline to 30 cc per hour if sodium level does not improve any further today Currently 3% saline is a 25 cc per hour  2. Other- pancreatic mass, obstruction of common bile duct   LOS: 2 Kiel Cockerell 9/30/201611:49 AM

## 2015-01-09 NOTE — Care Management (Signed)
Admitted to [facility] with the diagnosis of obstructive jaundice. Lives with wife, Patrici Ranks [phone]). Last seen Dr. Rosanna Randy on Wednesday. No home health. No skilled facility. No home oxygen,  Uses no aids for ambulation. No Life Alert. Takes care of both basic and instrumental activities of daily living himself, still drives. No falls, Fair appetite.  NPO Cholangiogram today Shelbie Ammons RN MSN Care Management [phone]

## 2015-01-09 NOTE — Progress Notes (Signed)
[name] NAME: Geoffrey Gregory    MR#:  [id number]  DATE OF BIRTH:  1933-02-19  SUBJECTIVE:  ERCP was canceled yesterday due to the fact the patient has tonsillar cancer and they were unable to you ERCP. He is planned for percutaneous drain to be placed by IR this afternoon. He continues to be in good spirits.  REVIEW OF SYSTEMS:    Review of Systems  Constitutional: Negative for fever, chills and malaise/fatigue.  HENT: Negative for sore throat.   Eyes: Negative for blurred vision.  Respiratory: Negative for cough, hemoptysis, shortness of breath and wheezing.   Cardiovascular: Negative for chest pain, palpitations and leg swelling.  Gastrointestinal: Negative for nausea, vomiting, abdominal pain, diarrhea and blood in stool.  Genitourinary: Negative for dysuria.  Musculoskeletal: Negative for back pain.  Skin:       Jaundice  Neurological: Negative for dizziness, tremors and headaches.  Endo/Heme/Allergies: Does not bruise/bleed easily.    Tolerating Diet: Clear liquid     DRUG ALLERGIES:  No Known Allergies  VITALS:  Blood pressure 125/71, pulse 71, temperature 98.5 F (36.9 C), temperature source Oral, resp. rate 18, height 5\' 5"  (1.651 m), weight 65.772 kg (145 lb), SpO2 99 %.  PHYSICAL EXAMINATION:   Physical Exam  Constitutional: He is oriented to person, place, and time and well-developed, well-nourished, and in no distress. No distress.  HENT:  Head: Normocephalic.  Eyes: Scleral icterus is present.  Neck: Normal range of motion. Neck supple. No JVD present. No tracheal deviation present.  Cardiovascular: Normal rate, regular rhythm and normal heart sounds.  Exam reveals no gallop and no friction rub.   No murmur heard. Pulmonary/Chest: Effort normal and breath sounds normal. No respiratory distress. He has no wheezes. He has no rales. He exhibits no tenderness.  Abdominal: Soft. Bowel sounds are  normal. He exhibits no distension and no mass. There is no tenderness. There is no rebound and no guarding.  Musculoskeletal: Normal range of motion. He exhibits no edema.  Neurological: He is alert and oriented to person, place, and time.  Skin: Skin is warm. No rash noted. No erythema.  Jaundice  Psychiatric: Affect and judgment normal.      LABORATORY PANEL:   CBC  Recent Labs Lab 01/08/15 0428  WBC 7.6  HGB 8.9*  HCT 26.3*  PLT 427   ------------------------------------------------------------------------------------------------------------------  Chemistries   Recent Labs Lab 01/09/15 0156 01/09/15 0653  NA 116* 120*  K 4.1 4.1  CL 86* 89*  CO2 21* 22  GLUCOSE 203* 166*  BUN 25* 26*  CREATININE 0.95 0.89  CALCIUM 8.1* 8.5*  AST 214*  --   ALT 147*  --   ALKPHOS 916*  --   BILITOT 21.5*  --    ------------------------------------------------------------------------------------------------------------------  Cardiac Enzymes No results for input(s): TROPONINI in the last 168 hours. ------------------------------------------------------------------------------------------------------------------  RADIOLOGY:  Mr Abd W/wo Cm/mrcp  01/07/2015   CLINICAL DATA:  Biliary obstruction.  Jaundice  EXAM: MRI ABDOMEN WITHOUT AND WITH CONTRAST (INCLUDING MRCP)  TECHNIQUE: Multiplanar multisequence MR imaging of the abdomen was performed both before and after the administration of intravenous contrast. Heavily T2-weighted images of the biliary and pancreatic ducts were obtained, and three-dimensional MRCP images were rendered by post processing.  CONTRAST:  20mL MULTIHANCE GADOBENATE DIMEGLUMINE 529 MG/ML IV SOLN  COMPARISON:  05/06/2014  FINDINGS: Lower chest: Small bilateral pleural effusions are identified. New from previous study.  Hepatobiliary: No focal liver  lesions identified. Marked intrahepatic bile duct dilatation. The extrahepatic common bile duct measured 1 cm.  Abrupt cut off of the common bile duct is identified.  Pancreas: There is an ill defined mass arising from the head of pancreas which measures approximately 4.1 x 3.6 x 4.6 cm. The mass completely encases the portal venous confluence with significant diminished caliber of the portal vein. Branches of the celiac trunk appear encased by this mass. The SMA appears patent an unaffected.  Spleen: Normal appearance of the spleen.  Adrenals/Urinary Tract: The adrenal glands are unremarkable. Bilateral renal cysts noted.  Stomach/Bowel: The stomach and the upper abdominal bowel loops are unremarkable.  Vascular/Lymphatic: Status post repair of infrarenal abdominal aortic aneurysm. The aneurysm sac measures 4.6 cm, image 7 of series 13. Previously 5.5 cm. No retroperitoneal adenopathy identified.  Other: There is a small amount of ascites identified within the upper abdomen.  Musculoskeletal: No specific findings to suggest bone metastasis.  IMPRESSION: 1. Large mass arising from the head of pancreas is identified worrisome for primary pancreatic neoplasm. 2. There is complete obstruction of the common bile duct. The portal venous confluence is completely encased by the lesion. There is also involvement of a branches of the celiac trunk. 3. No specific findings identified to suggest liver metastasis.   Electronically Signed   By: Kerby Moors M.D.   On: 01/07/2015 17:53   US Abdomen Limited Ruq  01/07/2015   CLINICAL DATA:  Elevated liver function studies, history of diabetes, alcohol dependence in remission, coronary artery disease.  EXAM: US ABDOMEN LIMITED - RIGHT UPPER QUADRANT  COMPARISON:  Abdominal pelvic CT scan of December 20, 2013  FINDINGS: Gallbladder:  The gallbladder is adequately distended. There is echogenic bile versus tiny floating stones within the gallbladder lumen. There is no gallbladder wall thickening, pericholecystic fluid, or positive sonographic Murphy's sign.  Common bile duct:  Diameter: 10  mm  Liver:  The intrahepatic ducts are dilated. No discrete hepatic mass is observed. There is a hypoechoic mass associated with the inferior aspect of the pancreatic head. There is ascites.  IMPRESSION: Suspicious masslike lesion in the region of the pancreatic head resulting in biliary ductal dilation. Possible tiny gallstones versus echogenic bile.  Abdominal MRI or CT scanning is recommended.   Electronically Signed   By: David  Martinique M.D.   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  1. Painless jaundice with elevated bilirubin: MRI shows large mass arising from the head of the pancreas which is concerning for primary pancreatic neoplasm. Due to his tonsillar cancer he was unable to get a ERCP. Plan is for percutaneous drain to be placed by IR today.. Follow-up on CA 19-9. LFTs are decreasing. Once patient has drain placed he may be discharged with follow-up with oncology and further diagnostic workup including EUS will be planned as an outpatient.  2. 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 Sodium level has improved with 3% saline. Nephrology is following. Continue to monitor sodium level.  3. Essential hypertension: Continue metoprolol  4. Diabetes type II without complication: Patient will continue on sliding scale insulin and may resume oral medications once patient is on a regular diet.  5. Anemia of chronic disease: Continue to monitor hemoglobin 6. Tonsillar cancer: Patient will follow up with oncology as outpatient.  Management plans discussed with the patient  and he is in agreement.  CODE STATUS: FULL  TOTAL TIME TAKING CARE OF THIS PATIENT: 30 minutes.     POSSIBLE D/C tomorrow to home, DEPENDING ON CLINICAL CONDITION.   MODY, SITAL M.D on 01/09/2015 at 11:24 AM  Between 7am to 6pm - Pager - [phone] After 6pm  go to [email or web address] - password [email or web address] [facility]  Office  [phone]  CC: Primary care physician; [name], MD

## 2015-01-09 NOTE — Progress Notes (Signed)
MEDICATION RELATED CONSULT NOTE - INITIAL   Pharmacy Consult for hypertonic saline Indication: hyponatremia  No Known Allergies  Patient Measurements: Height: 5\' 5"  (165.1 cm) Weight: 145 lb (65.772 kg) IBW/kg (Calculated) : 61.5 Adjusted Body Weight: 65.8 kg  Vital Signs: Temp: 98 F (36.7 C) (09/29 2115) Temp Source: Oral (09/29 2115) BP: 138/65 mmHg (09/29 2115) Pulse Rate: 82 (09/29 2115) Intake/Output from previous day: 09/29 0701 - 09/30 0700 In: 450 [I.V.:450] Out: -  Intake/Output from this shift:    Labs:  Recent Labs  01/07/15 1149 01/07/15 1953 01/08/15 0428 01/08/15 0858 01/09/15 0156  WBC 8.2 8.2 7.6  --   --   HGB 10.1* 9.8* 8.9*  --   --   HCT 29.5* 27.6* 26.3*  --   --   PLT 471* 450* 427  --   --   CREATININE 0.84 0.88 0.90 0.91 0.95  ALBUMIN 2.4*  --  2.1*  --  2.2*  PROT 6.4*  --  5.7*  --  6.2*  AST 189*  --  165*  --  214*  ALT 149*  --  130*  --  147*  ALKPHOS 871*  --  786*  --  916*  BILITOT 19.7*  --  18.5*  --  21.5*  BILIDIR 12.7*  --   --   --   --    Estimated Creatinine Clearance: 54.8 mL/min (by C-G formula based on Cr of 0.95).   Microbiology: Recent Results (from the past 720 hour(s))  Urine Culture     Status: None   Collection Time: 01/05/15 12:00 AM  Result Value Ref Range Status   Urine Culture, Routine Final report  Final   Urine Culture result 1 No growth  Final    Medical History: Past Medical History  Diagnosis Date  . Hyperlipidemia   . 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 Hypertension   . Diabetes mellitus without complication   . CAD (coronary artery disease)   . Vitamin D deficiency   . AAA (abdominal aortic aneurysm)     Medications:  Infusions:  . sodium chloride (hypertonic)      Assessment: 81 yom cc weakness with history of cancer sp chemo/radiation approx 2 years ago. Presents with dark urine and elevated bilirubin. Na 111 on admission, baseline low 130s. Has been on 100 mL/hr  normal saline and serum sodium isn't responding, pharmacy consulted to monitor hypertonic saline. Nephrology consult pending  Goal of Therapy:  Sodium 135 mEq/L  Plan:  Hypertonic sodium chloride 25 mL/hr, will check serum sodium level every 4 hours and adjust dose as needed to avoid correction of greater than 8 mEq in 24 hours.   3846 0428 Na 113 will continue current rate with goal correction of 8 mEq in 24 hours. 6599 0857 Na 113 will continue current rate with goal correction of 8 mEq in 24 hours.   3570 1258 Na 113 - nephrology following, rate increased to 67ml/hr. Will continue Q4H monitoring, with goal correction of 8 mEq in 24H  0929  1600 :   Na = 115 ,  Will continue hypertonic saline and recheck Na in 4 hrs.             20:00 :  Na = 123,  This represents an increase in Na > 8 mEq in 24 hrs.  Will d/c hypertonic saline and continue  to recheck Na every 4 hrs.   Will recheck sodium in 4 hours.  0930 0156 sodium 116, resumed 3% sodium chloride at 25 mL/hr. Will check sodium level in 4 hours  Geoffrey Gregory A. Jordan Hawks, PharmD  Clinical Pharmacist 01/09/2015 2:59 AM

## 2015-01-09 NOTE — Discharge Summary (Addendum)
[facility] [place] at [facility]   PATIENT NAME: Geoffrey Gregory    MR#:  [id number]  DATE OF BIRTH:  Mar 07, 1933  DATE OF ADMISSION:  01/07/2015 ADMITTING PHYSICIAN: Katharina Caper, MD  DATE OF DISCHARGE:01/09/2015 PRIMARY CARE PHYSICIAN: Megan Mans, MD    ADMISSION DIAGNOSIS:  Hyperbilirubinemia [E80.6] Pancreatic mass [K86.9] Obstructive jaundice [K83.8]  DISCHARGE DIAGNOSIS:  Active Problems:   Obstructive jaundice   Pancreatic mass   SECONDARY DIAGNOSIS:   Past Medical History  Diagnosis Date  . Hyperlipidemia   . 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 Hypertension   . Diabetes mellitus without complication   . CAD (coronary artery disease)   . Vitamin D deficiency   . AAA (abdominal aortic aneurysm)     HOSPITAL COURSE:   81 year old male with history of tonsillar cancer who presents with painless jaundice and a pancreatic mass found on ultrasound/MRI.  1. Painless jaundice with elevated bilirubin: MRI shows large mass arising from the head of the pancreas which is concerning for primary pancreatic neoplasm. Due to his tonsillar cancer he was unable to get a ERCP as per anestiology. Plan is for percutaneous drain to be placed by IR today. He will be transferred to [place] for this as our facility is unable to perform this. As per  My conversation with Dr. Servando Snare, Dr Chilton Si who is at [facility] today has spoken with the Interventionist at [facility] and he is prepared to perform the PTC at [facility].  Follow-up on CA 19-9. LFTs are decreasing. Once patient has drain placed he will need wither EUS or CT guided biopsy of the pancreatic mass.   2. Hyponatremia: I suspect this is related to SIADH from suspected neoplasm. Sodium level has improved with 3% saline. Nephrology is following. Continue to monitor sodium level. He is on  3% at 32 ml/hr currently. Goal sodium would be 128 tomorrow.  3. Essential hypertension: Continue  metoprolol  4. Diabetes type II without complication: Patient will continue on sliding scale insulin and may resume oral medications once patient is on a regular diet.  5. Anemia of chronic disease: Continue to monitor hemoglobin 6. Tonsillar cancer: Patient will follow up with Dr Doylene Canning  as outpatient.  DISCHARGE CONDITIONS AND DIET:  Transfer to  NPO for IR procedure  CONSULTS OBTAINED:  Treatment Team:  Midge Minium, MD Johney Maine, MD Mosetta Pigeon, MD  DRUG ALLERGIES:  No Known Allergies  DISCHARGE MEDICATIONS:   Current Discharge Medication List    CONTINUE these medications which have NOT CHANGED   Details  acetaminophen (TYLENOL) 500 MG tablet Take 500 mg by mouth every 6 (six) hours as needed.    aspirin 81 MG tablet Take by mouth daily.    Cholecalciferol (VITAMIN D3) 1000 UNITS CAPS Take 1 capsule by mouth daily.    doxycycline (VIBRA-TABS) 100 MG tablet Take 1 tablet (100 mg total) by mouth 2 (two) times daily. Qty: 20 tablet, Refills: 0   Associated Diagnoses: Prostatitis, acute    hydrochlorothiazide (HYDRODIURIL) 25 MG tablet Take by mouth daily.    lovastatin (MEVACOR) 40 MG tablet Take 1 tablet (40 mg total) by mouth at bedtime. Qty: 30 tablet, Refills: 6    metoprolol (LOPRESSOR) 50 MG tablet Take 1 tablet (50 mg total) by mouth 2 (two) times daily. Qty: 60 tablet, Refills: 6    omeprazole (PRILOSEC) 40 MG capsule Take 40 mg by mouth daily.  Today   CHIEF COMPLAINT:  Patient is fine with  Being transferred to [place] for IR procedure and further workup for pancreatic mass  VITAL SIGNS:  Blood pressure 120/62, pulse 72, temperature 98.5 F (36.9 C), temperature source Oral, resp. rate 18, height $RemoveBe'5\' 5"'zVaTdCltR$  (1.651 m), weight 65.772 kg (145 lb), SpO2 99 %.   REVIEW OF SYSTEMS:  Review of Systems  Constitutional: Negative for fever, chills and malaise/fatigue.  HENT: Negative for sore throat.   Eyes: Negative for  blurred vision.  Respiratory: Negative for cough, hemoptysis, shortness of breath and wheezing.   Cardiovascular: Negative for chest pain, palpitations and leg swelling.  Gastrointestinal: Negative for nausea, vomiting, abdominal pain, diarrhea and blood in stool.  Genitourinary: Negative for dysuria.  Musculoskeletal: Negative for back pain.  Skin:       jaundice  Neurological: Negative for dizziness, tremors and headaches.  Endo/Heme/Allergies: Does not bruise/bleed easily.     PHYSICAL EXAMINATION:  GENERAL:  81 y.o.-year-old patient lying in the bed with no acute distress.  NECK:  Supple, no jugular venous distention. No thyroid enlargement, no tenderness.  LUNGS: Normal breath sounds bilaterally, no wheezing, rales,rhonchi  No use of accessory muscles of respiration.  CARDIOVASCULAR: S1, S2 normal. No murmurs, rubs, or gallops.  ABDOMEN: Soft, non-tender, non-distended. Bowel sounds present. No organomegaly or mass.  EXTREMITIES: No pedal edema, cyanosis, or clubbing.  PSYCHIATRIC: The patient is alert and oriented x 3.  SKIN: No obvious rash, lesion, or ulcer.  +JAUNDICE   DATA REVIEW:   CBC  Recent Labs Lab 01/08/15 0428  WBC 7.6  HGB 8.9*  HCT 26.3*  PLT 427    Chemistries   Recent Labs Lab 01/09/15 0156 01/09/15 0653 01/09/15 1230  NA 116* 120* 121*  K 4.1 4.1  --   CL 86* 89*  --   CO2 21* 22  --   GLUCOSE 203* 166*  --   BUN 25* 26*  --   CREATININE 0.95 0.89  --   CALCIUM 8.1* 8.5*  --   AST 214*  --   --   ALT 147*  --   --   ALKPHOS 916*  --   --   BILITOT 21.5*  --   --     Cardiac Enzymes No results for input(s): TROPONINI in the last 168 hours.  Microbiology Results  $Remove'@MICRORSLT48'HyAZcFj$ @  RADIOLOGY:  [name] W/wo Cm/mrcp  01/07/2015   CLINICAL DATA:  Biliary obstruction.  Jaundice  EXAM: MRI ABDOMEN WITHOUT AND WITH CONTRAST (INCLUDING MRCP)  TECHNIQUE: Multiplanar multisequence MR imaging of the abdomen was performed both before and after the  administration of intravenous contrast. Heavily T2-weighted images of the biliary and pancreatic ducts were obtained, and three-dimensional MRCP images were rendered by post processing.  CONTRAST:  68mL MULTIHANCE GADOBENATE DIMEGLUMINE 529 MG/ML IV SOLN  COMPARISON:  05/06/2014  FINDINGS: Lower chest: Small bilateral pleural effusions are identified. New from previous study.  Hepatobiliary: No focal liver lesions identified. Marked intrahepatic bile duct dilatation. The extrahepatic common bile duct measured 1 cm. Abrupt cut off of the common bile duct is identified.  Pancreas: There is an ill defined mass arising from the head of pancreas which measures approximately 4.1 x 3.6 x 4.6 cm. The mass completely encases the portal venous confluence with significant diminished caliber of the portal vein. Branches of the celiac trunk appear encased by this mass. The SMA appears patent an unaffected.  Spleen: Normal appearance of the spleen.  Adrenals/Urinary  Tract: The adrenal glands are unremarkable. Bilateral renal cysts noted.  Stomach/Bowel: The stomach and the upper abdominal bowel loops are unremarkable.  Vascular/Lymphatic: Status post repair of infrarenal abdominal aortic aneurysm. The aneurysm sac measures 4.6 cm, image 7 of series 13. Previously 5.5 cm. No retroperitoneal adenopathy identified.  Other: There is a small amount of ascites identified within the upper abdomen.  Musculoskeletal: No specific findings to suggest bone metastasis.  IMPRESSION: 1. Large mass arising from the head of pancreas is identified worrisome for primary pancreatic neoplasm. 2. There is complete obstruction of the common bile duct. The portal venous confluence is completely encased by the lesion. There is also involvement of a branches of the celiac trunk. 3. No specific findings identified to suggest liver metastasis.   Electronically Signed   By: Kerby Moors M.D.   On: 01/07/2015 17:53      Management plans discussed with  the patient and heis in agreement. Stable for transfer  Patient should follow up with [facility] ONCOLOGY  CODE STATUS:     Code Status Orders        Start     Ordered   01/07/15 1549  Full code   Continuous     01/07/15 1548      TOTAL TIME TAKING CARE OF THIS PATIENT: 30 minutes.  >50% time spent care and coordination of care with transfer line, family and Dr Domenica Fail M.D on 01/09/2015 at 3:14 PM  Between 7am to 6pm - Pager - [phone] After 6pm go to [email or web address] - password [email or web address] [place] [facility]  Office  [phone]  CC: Primary care physician; [name], MD

## 2015-01-09 NOTE — Care Management Important Message (Signed)
Important Message  Patient Details  Name: GUNTER CONDE MRN: [id number] Date of Birth: 02-Sep-1932   Medicare Important Message Given:  Yes-second notification given    Juliann Pulse A Allmond 01/09/2015, 11:15 AM

## 2015-01-09 NOTE — Progress Notes (Signed)
  Oncology Nurse Navigator Documentation  Referral date to RadOnc/MedOnc: 01/09/15 (01/09/15 1000) Navigator Encounter Type:  (In hospital introduction) (01/09/15 1000) Patient Visit Type: Inpatient (01/09/15 1000)       Interventions: Coordination of Care (01/09/15 1000)   Coordination of Care: EUS (01/09/15 1000)        Time Spent with Patient: 30 (01/09/15 1000)   Spoke to patient and wife regarding scheduling of EUS after discharge. Anesthesia was unable to intubate for ERCP. [facility] physicians notified regarding this and will have anesthesia assess patient before EUS at [facility]. Will get scheduled date for EUS after discharge. Contact information given to patient for any questions or concerns. He will follow up with Dr [name] after discharge. Scheduled for percutaneous drain of obstruction today.

## 2015-01-09 NOTE — Plan of Care (Signed)
Problem: Discharge Progression Outcomes Goal: Other Discharge Outcomes/Goals Outcome: Progressing VSS. Pt denies pain. Pt remained NPO during the shift. CBG stable.Sodium level 120, continue hypertonic solution during transfer via port. IV cath in L AC and R AC remained in place. Report given to Dortha Kern, RN. Pt transported to [facility] by [employer] [facility]. Acupuncturist given.

## 2015-01-09 NOTE — Addendum Note (Signed)
Addendum  created 01/09/15 0130 by Alvin Critchley, MD   Modules edited: Anesthesia Events, Narrator   Narrator:  Narrator: Event Log Edited

## 2015-01-09 NOTE — Care Management (Signed)
Will be transferred to [facility] today per Dr. Benjie Karvonen. Will be transported per [facility]. Mr. Schertzer is in agreement with plan Shelbie Ammons RN MSN Care Management [phone]

## 2015-01-10 ENCOUNTER — Inpatient Hospital Stay (HOSPITAL_COMMUNITY): Payer: Medicare HMO

## 2015-01-10 ENCOUNTER — Encounter (HOSPITAL_COMMUNITY): Payer: Self-pay | Admitting: Radiology

## 2015-01-10 DIAGNOSIS — E0781 Sick-euthyroid syndrome: Secondary | ICD-10-CM | POA: Diagnosis present

## 2015-01-10 LAB — BASIC METABOLIC PANEL
ANION GAP: 7 (ref 5–15)
ANION GAP: 8 (ref 5–15)
ANION GAP: 6 (ref 5–15)
ANION GAP: 7 (ref 5–15)
ANION GAP: 8 (ref 5–15)
ANION GAP: 8 (ref 5–15)
ANION GAP: 9 (ref 5–15)
Anion gap: 9 (ref 5–15)
BUN: 20 mg/dL (ref 6–20)
BUN: 20 mg/dL (ref 6–20)
BUN: 21 mg/dL — AB (ref 6–20)
BUN: 21 mg/dL — ABNORMAL HIGH (ref 6–20)
BUN: 21 mg/dL — ABNORMAL HIGH (ref 6–20)
BUN: 21 mg/dL — ABNORMAL HIGH (ref 6–20)
BUN: 21 mg/dL — ABNORMAL HIGH (ref 6–20)
BUN: 21 mg/dL — ABNORMAL HIGH (ref 6–20)
CALCIUM: 8.3 mg/dL — AB (ref 8.9–10.3)
CALCIUM: 8.1 mg/dL — AB (ref 8.9–10.3)
CALCIUM: 8.1 mg/dL — AB (ref 8.9–10.3)
CALCIUM: 8.2 mg/dL — AB (ref 8.9–10.3)
CALCIUM: 8.1 mg/dL — AB (ref 8.9–10.3)
CALCIUM: 8.1 mg/dL — AB (ref 8.9–10.3)
CALCIUM: 8.3 mg/dL — AB (ref 8.9–10.3)
CHLORIDE: 93 mmol/L — AB (ref 101–111)
CHLORIDE: 93 mmol/L — AB (ref 101–111)
CHLORIDE: 95 mmol/L — AB (ref 101–111)
CO2: 21 mmol/L — ABNORMAL LOW (ref 22–32)
CO2: 20 mmol/L — ABNORMAL LOW (ref 22–32)
CO2: 22 mmol/L (ref 22–32)
CO2: 21 mmol/L — ABNORMAL LOW (ref 22–32)
CO2: 22 mmol/L (ref 22–32)
CO2: 19 mmol/L — AB (ref 22–32)
CO2: 21 mmol/L — AB (ref 22–32)
CO2: 23 mmol/L (ref 22–32)
CREATININE: 1.19 mg/dL (ref 0.61–1.24)
CREATININE: 1.2 mg/dL (ref 0.61–1.24)
CREATININE: 1.21 mg/dL (ref 0.61–1.24)
CREATININE: 1.23 mg/dL (ref 0.61–1.24)
CREATININE: 1.23 mg/dL (ref 0.61–1.24)
CREATININE: 1.3 mg/dL — AB (ref 0.61–1.24)
Calcium: 8.2 mg/dL — ABNORMAL LOW (ref 8.9–10.3)
Chloride: 95 mmol/L — ABNORMAL LOW (ref 101–111)
Chloride: 96 mmol/L — ABNORMAL LOW (ref 101–111)
Chloride: 95 mmol/L — ABNORMAL LOW (ref 101–111)
Chloride: 95 mmol/L — ABNORMAL LOW (ref 101–111)
Chloride: 92 mmol/L — ABNORMAL LOW (ref 101–111)
Creatinine, Ser: 1.15 mg/dL (ref 0.61–1.24)
Creatinine, Ser: 1.21 mg/dL (ref 0.61–1.24)
GFR calc Af Amer: >60 mL/min (ref >60)
GFR calc Af Amer: >60 mL/min (ref >60)
GFR calc non Af Amer: 54 mL/min — ABNORMAL LOW (ref >60)
GFR calc non Af Amer: 55 mL/min — ABNORMAL LOW (ref >60)
GFR calc non Af Amer: 56 mL/min — ABNORMAL LOW (ref >60)
GFR calc non Af Amer: 56 mL/min — ABNORMAL LOW (ref >60)
GFR, EST AFRICAN AMERICAN: 59 mL/min — AB (ref >60)
GFR, EST NON AFRICAN AMERICAN: 54 mL/min — AB (ref >60)
GFR, EST NON AFRICAN AMERICAN: 59 mL/min — AB (ref >60)
GFR, EST NON AFRICAN AMERICAN: 55 mL/min — AB (ref >60)
GFR, EST NON AFRICAN AMERICAN: 51 mL/min — AB (ref >60)
GLUCOSE: 136 mg/dL — AB (ref 65–99)
GLUCOSE: 147 mg/dL — AB (ref 65–99)
Glucose, Bld: 119 mg/dL — ABNORMAL HIGH (ref 65–99)
Glucose, Bld: 136 mg/dL — ABNORMAL HIGH (ref 65–99)
Glucose, Bld: 140 mg/dL — ABNORMAL HIGH (ref 65–99)
Glucose, Bld: 145 mg/dL — ABNORMAL HIGH (ref 65–99)
Glucose, Bld: 159 mg/dL — ABNORMAL HIGH (ref 65–99)
Glucose, Bld: 203 mg/dL — ABNORMAL HIGH (ref 65–99)
Potassium: 3.7 mmol/L (ref 3.5–5.1)
Potassium: 3.9 mmol/L (ref 3.5–5.1)
Potassium: 4 mmol/L (ref 3.5–5.1)
Potassium: 4 mmol/L (ref 3.5–5.1)
Potassium: 4 mmol/L (ref 3.5–5.1)
Potassium: 4 mmol/L (ref 3.5–5.1)
Potassium: 4.1 mmol/L (ref 3.5–5.1)
Potassium: 4.1 mmol/L (ref 3.5–5.1)
SODIUM: 121 mmol/L — AB (ref 135–145)
SODIUM: 122 mmol/L — AB (ref 135–145)
SODIUM: 123 mmol/L — AB (ref 135–145)
SODIUM: 123 mmol/L — AB (ref 135–145)
SODIUM: 124 mmol/L — AB (ref 135–145)
SODIUM: 124 mmol/L — AB (ref 135–145)
Sodium: 124 mmol/L — ABNORMAL LOW (ref 135–145)
Sodium: 124 mmol/L — ABNORMAL LOW (ref 135–145)

## 2015-01-10 LAB — CBC WITH DIFFERENTIAL/PLATELET
Basophils Absolute: 0 10*3/uL (ref 0.0–0.1)
Basophils Relative: 0 %
EOS PCT: 0 %
Eosinophils Absolute: 0 10*3/uL (ref 0.0–0.7)
HCT: 23.7 % — ABNORMAL LOW (ref 39.0–52.0)
Hemoglobin: 8.3 g/dL — ABNORMAL LOW (ref 13.0–17.0)
LYMPHS ABS: 0.3 10*3/uL — AB (ref 0.7–4.0)
LYMPHS PCT: 2 %
MCH: 28.5 pg (ref 26.0–34.0)
MCHC: 35 g/dL (ref 30.0–36.0)
MCV: 81.4 fL (ref 78.0–100.0)
MONO ABS: 1.1 10*3/uL — AB (ref 0.1–1.0)
MONOS PCT: 8 %
Neutro Abs: 12.1 10*3/uL — ABNORMAL HIGH (ref 1.7–7.7)
Neutrophils Relative %: 90 %
PLATELETS: 415 10*3/uL — AB (ref 150–400)
RBC: 2.91 MIL/uL — AB (ref 4.22–5.81)
RDW: 17.1 % — ABNORMAL HIGH (ref 11.5–15.5)
WBC: 13.4 10*3/uL — AB (ref 4.0–10.5)

## 2015-01-10 LAB — HEPATIC FUNCTION PANEL
ALT: 170 U/L — AB (ref 17–63)
AST: 261 U/L — ABNORMAL HIGH (ref 15–41)
Albumin: 1.8 g/dL — ABNORMAL LOW (ref 3.5–5.0)
Alkaline Phosphatase: 759 U/L — ABNORMAL HIGH (ref 38–126)
BILIRUBIN DIRECT: 12.5 mg/dL — AB (ref 0.1–0.5)
BILIRUBIN INDIRECT: 6.6 mg/dL — AB (ref 0.3–0.9)
Total Bilirubin: 19.1 mg/dL (ref 0.3–1.2)
Total Protein: 5.1 g/dL — ABNORMAL LOW (ref 6.5–8.1)

## 2015-01-10 LAB — URIC ACID: Uric Acid, Serum: 3.5 mg/dL — ABNORMAL LOW (ref 4.4–7.6)

## 2015-01-10 LAB — OSMOLALITY, URINE: OSMOLALITY UR: 613 mosm/kg (ref 390–1090)

## 2015-01-10 LAB — OSMOLALITY: OSMOLALITY: 271 mosm/kg — AB (ref 275–300)

## 2015-01-10 LAB — CREATININE, URINE, RANDOM: Creatinine, Urine: 99.88 mg/dL

## 2015-01-10 LAB — SODIUM, URINE, RANDOM: Sodium, Ur: 91 mmol/L

## 2015-01-10 MED ORDER — IOHEXOL 300 MG/ML  SOLN
50.0000 mL | Freq: Once | INTRAMUSCULAR | Status: DC | PRN
  Administered 2015-01-10: 20 mL
  Filled 2015-01-10: qty 50

## 2015-01-10 MED ORDER — LIDOCAINE HCL 1 % IJ SOLN
INTRAMUSCULAR | Status: AC
  Filled 2015-01-10: qty 20

## 2015-01-10 MED ORDER — HYDROCODONE-ACETAMINOPHEN 5-325 MG PO TABS
1.0000 | ORAL_TABLET | ORAL | Status: DC | PRN
  Administered 2015-01-10 – 2015-01-17 (×9): 1 via ORAL
  Administered 2015-01-18: 2 via ORAL
  Administered 2015-01-19 – 2015-01-22 (×6): 1 via ORAL
  Filled 2015-01-10 (×4): qty 1
  Filled 2015-01-10: qty 2
  Filled 2015-01-10 (×11): qty 1

## 2015-01-10 MED ORDER — FENTANYL CITRATE (PF) 100 MCG/2ML IJ SOLN
INTRAMUSCULAR | Status: AC | PRN
  Administered 2015-01-10 (×4): 25 ug via INTRAVENOUS

## 2015-01-10 MED ORDER — MIDAZOLAM HCL 2 MG/2ML IJ SOLN
INTRAMUSCULAR | Status: AC
  Filled 2015-01-10: qty 2

## 2015-01-10 MED ORDER — CEFAZOLIN SODIUM-DEXTROSE 2-3 GM-% IV SOLR
INTRAVENOUS | Status: AC
  Administered 2015-01-10: 2 g via INTRAVENOUS
  Filled 2015-01-10: qty 50

## 2015-01-10 MED ORDER — FENTANYL CITRATE (PF) 100 MCG/2ML IJ SOLN
INTRAMUSCULAR | Status: AC
  Filled 2015-01-10: qty 2

## 2015-01-10 MED ORDER — CEFAZOLIN SODIUM-DEXTROSE 2-3 GM-% IV SOLR
2.0000 g | Freq: Once | INTRAVENOUS | Status: AC
  Administered 2015-01-10: 2 g via INTRAVENOUS

## 2015-01-10 MED ORDER — FUROSEMIDE 10 MG/ML IJ SOLN
40.0000 mg | Freq: Two times a day (BID) | INTRAMUSCULAR | Status: DC
  Administered 2015-01-10 – 2015-01-11 (×2): 40 mg via INTRAVENOUS
  Filled 2015-01-10 (×4): qty 4

## 2015-01-10 MED ORDER — SODIUM CHLORIDE 0.9 % IJ SOLN
5.0000 mL | Freq: Three times a day (TID) | INTRAMUSCULAR | Status: DC
  Administered 2015-01-10 – 2015-01-21 (×29): 5 mL

## 2015-01-10 MED ORDER — FENTANYL CITRATE (PF) 100 MCG/2ML IJ SOLN
INTRAMUSCULAR | Status: DC
Start: 2015-01-10 — End: 2015-01-10
  Filled 2015-01-10: qty 2

## 2015-01-10 MED ORDER — MIDAZOLAM HCL 2 MG/2ML IJ SOLN
INTRAMUSCULAR | Status: AC | PRN
  Administered 2015-01-10 (×2): 0.5 mg via INTRAVENOUS
  Administered 2015-01-10: 1 mg via INTRAVENOUS

## 2015-01-10 MED ORDER — SODIUM CHLORIDE 0.9 % IV SOLN
INTRAVENOUS | Status: DC
  Administered 2015-01-10 – 2015-01-12 (×3): via INTRAVENOUS

## 2015-01-10 NOTE — Sedation Documentation (Signed)
Patient denies pain and is resting comfortably.  

## 2015-01-10 NOTE — H&P (Signed)
Chief Complaint: Patient was seen in consultation today for biliary obstruction at the request of [name]  Referring Physician(s): TRH/GI  History of Present Illness: Geoffrey Gregory is a 81 y.o. male who was admitted at [facility] for biliary obstruction. He presented to ED with complaints of generalized weakness x 2 weeks and yellowing of the skin. He denies any abdominal pain. He has a history of tonsillar cancer and follows with [name]. An attempted ERCP was performed however secondary to post surgical anatomy the patient was unable to be intubated and was then transferred to [facility] for percutaneous biliary drain placement. He denies any chest pain, shortness of breath or palpitations. He denies any active signs of bleeding or excessive bruising. The patient denies any history of sleep apnea or chronic oxygen use. He has previously tolerated sedation without complications.    Past Medical History  Diagnosis Date  . Hyperlipidemia   . Cancer [facility]) Feb 2015    tonsil, chemo/radiation  . Hypertension   . Diabetes mellitus without complication ([place])   . CAD (coronary artery disease)   . Vitamin D deficiency   . AAA (abdominal aortic aneurysm) [facility])     Past Surgical History  Procedure Laterality Date  . Portacath placement  Fe 2015    [name]  . Axillary lymph node biopsy Left 11-28-13    atyical squamous cells  . Carotid stent  1999  . Hernia repair Bilateral 10-06-00    inguinal/ Dr. [name]  . Lymph node dissection Left 12-30-13    axilla  . Endoscopic retrograde cholangiopancreatography (ercp) with propofol N/A 01/08/2015    Procedure: ENDOSCOPIC RETROGRADE CHOLANGIOPANCREATOGRAPHY (ERCP) WITH PROPOFOL;  Surgeon: Lucilla Lame, MD;  Location: [facility] ENDOSCOPY;  Service: Endoscopy;  Laterality: N/A;    Allergies: Review of patient's allergies indicates no known allergies.  Medications: Prior to Admission medications   Medication Sig Start Date End Date Taking? Authorizing Provider    acetaminophen (TYLENOL) 500 MG tablet Take 500 mg by mouth every 6 (six) hours as needed.    Historical Provider, MD  aspirin 81 MG tablet Take by mouth daily. 06/08/11   Historical Provider, MD  metoprolol (LOPRESSOR) 50 MG tablet Take 1 tablet (50 mg total) by mouth 2 (two) times daily. 10/17/14   [name], MD  omeprazole (PRILOSEC) 40 MG capsule Take 40 mg by mouth daily.  12/11/13   Historical Provider, MD     Family History  Problem Relation Age of Onset  . Diabetes Mother   . Heart disease Mother   . Stroke Sister   . Kidney disease Sister     kidney failure  . Hypertension Brother     Social History   Social History  . Marital Status: Married    Spouse Name: N/A  . Number of Children: N/A  . Years of Education: N/A   Social History Main Topics  . Smoking status: Former Smoker -- 1.00 packs/day for 15 years    Types: Cigarettes    Quit date: 04/11/1997  . Smokeless tobacco: Never Used  . Alcohol Use: No  . Drug Use: No  . Sexual Activity: Not Asked   Other Topics Concern  . None   Social History Narrative    Review of Systems: A 12 point ROS discussed and pertinent positives are indicated in the HPI above.  All other systems are negative.  Review of Systems  Vital Signs: BP 159/63 mmHg  Pulse 74  Temp(Src) 97.9 F (  36.6 C) (Oral)  Resp 16  Ht 5\' 7"  (1.702 m)  Wt 144 lb 6.4 oz (65.5 kg)  BMI 22.61 kg/m2  SpO2 98%  Physical Exam  Constitutional: He is oriented to person, place, and time. No distress.  HENT:  Head: Normocephalic and atraumatic.  Cardiovascular: Normal rate and regular rhythm.  Exam reveals no gallop and no friction rub.   No murmur heard. Pulmonary/Chest: Effort normal and breath sounds normal. No respiratory distress. He has no wheezes. He has no rales.  Abdominal: Soft. Bowel sounds are normal. He exhibits no distension. There is no tenderness.  Neurological: He is alert and oriented to person, place, and time.  Skin: He  is not diaphoretic.  Jaundice     Mallampati Score:  MD Evaluation Airway: WNL Heart: WNL Abdomen: WNL Chest/ Lungs: WNL ASA  Classification: 3 Mallampati/Airway Score: Two  Imaging: [name] W/wo Cm/mrcp  01/07/2015   CLINICAL DATA:  Biliary obstruction.  Jaundice  EXAM: MRI ABDOMEN WITHOUT AND WITH CONTRAST (INCLUDING MRCP)  TECHNIQUE: Multiplanar multisequence MR imaging of the abdomen was performed both before and after the administration of intravenous contrast. Heavily T2-weighted images of the biliary and pancreatic ducts were obtained, and three-dimensional MRCP images were rendered by post processing.  CONTRAST:  41mL MULTIHANCE GADOBENATE DIMEGLUMINE 529 MG/ML IV SOLN  COMPARISON:  05/06/2014  FINDINGS: Lower chest: Small bilateral pleural effusions are identified. New from previous study.  Hepatobiliary: No focal liver lesions identified. Marked intrahepatic bile duct dilatation. The extrahepatic common bile duct measured 1 cm. Abrupt cut off of the common bile duct is identified.  Pancreas: There is an ill defined mass arising from the head of pancreas which measures approximately 4.1 x 3.6 x 4.6 cm. The mass completely encases the portal venous confluence with significant diminished caliber of the portal vein. Branches of the celiac trunk appear encased by this mass. The SMA appears patent an unaffected.  Spleen: Normal appearance of the spleen.  Adrenals/Urinary Tract: The adrenal glands are unremarkable. Bilateral renal cysts noted.  Stomach/Bowel: The stomach and the upper abdominal bowel loops are unremarkable.  Vascular/Lymphatic: Status post repair of infrarenal abdominal aortic aneurysm. The aneurysm sac measures 4.6 cm, image 7 of series 13. Previously 5.5 cm. No retroperitoneal adenopathy identified.  Other: There is a small amount of ascites identified within the upper abdomen.  Musculoskeletal: No specific findings to suggest bone metastasis.  IMPRESSION: 1. Large mass arising  from the head of pancreas is identified worrisome for primary pancreatic neoplasm. 2. There is complete obstruction of the common bile duct. The portal venous confluence is completely encased by the lesion. There is also involvement of a branches of the celiac trunk. 3. No specific findings identified to suggest liver metastasis.   Electronically Signed   By: Kerby Moors M.D.   On: 01/07/2015 17:53   US Abdomen Limited Ruq  01/07/2015   CLINICAL DATA:  Elevated liver function studies, history of diabetes, alcohol dependence in remission, coronary artery disease.  EXAM: US ABDOMEN LIMITED - RIGHT UPPER QUADRANT  COMPARISON:  Abdominal pelvic CT scan of December 20, 2013  FINDINGS: Gallbladder:  The gallbladder is adequately distended. There is echogenic bile versus tiny floating stones within the gallbladder lumen. There is no gallbladder wall thickening, pericholecystic fluid, or positive sonographic Murphy's sign.  Common bile duct:  Diameter: 10 mm  Liver:  The intrahepatic ducts are dilated. No discrete hepatic mass is observed. There is a hypoechoic mass associated with the inferior aspect  of the pancreatic head. There is ascites.  IMPRESSION: Suspicious masslike lesion in the region of the pancreatic head resulting in biliary ductal dilation. Possible tiny gallstones versus echogenic bile.  Abdominal MRI or CT scanning is recommended.   Electronically Signed   By: David  Martinique M.D.   On: 01/07/2015 14:03    Labs:  CBC:  Recent Labs  01/07/15 1953 01/08/15 0428 01/09/15 2108 01/10/15 0713  WBC 8.2 7.6 14.7* 13.4*  HGB 9.8* 8.9* 9.0* 8.3*  HCT 27.6* 26.3* 25.3* 23.7*  PLT 450* 427 449* 415*    COAGS:  Recent Labs  03/13/14 0425 01/09/15 1417 01/09/15 2108  INR 1.1 1.08 1.19  APTT 27.7 25  --     BMP:  Recent Labs  01/10/15 0130 01/10/15 0355 01/10/15 0545 01/10/15 0713  NA 123* 122* 123* 124*  K 4.0 4.0 4.0 4.1  CL 95* 93* 95* 96*  CO2 21* 21* 20* 22  GLUCOSE 136*  140* 145* 119*  BUN 21* 21* 20 21*  CALCIUM 8.3* 8.1* 8.1* 8.2*  CREATININE 1.20 1.23 1.19 1.23  GFRNONAA 56* 54* 56* 54*  GFRAA >60 >60 >60 >60    LIVER FUNCTION TESTS:  Recent Labs  01/08/15 0428 01/09/15 0156 01/09/15 2108 01/10/15 0713  BILITOT 18.5* 21.5* 20.9* 19.1*  AST 165* 214* 247* 261*  ALT 130* 147* 165* 170*  ALKPHOS 786* 916* 868* 759*  PROT 5.7* 6.2* 5.5* 5.1*  ALBUMIN 2.1* 2.2* 2.0* 1.8*    TUMOR MARKERS:  Recent Labs  01/07/15 1953  CA199 418*    Assessment and Plan: Generalized weakness x 2 weeks Jaundice Malignant biliary obstruction, T. Bili 19.1 MR revealed large pancreatic mass History of tonsillar cancer unable to be intubated for ERCP secondary to postsurgical anatomy  Request received for image guided percutaneous biliary drain placement with sedation The patient has been NPO, no blood thinners taken, labs and vitals have been reviewed. Risks and Benefits discussed with the patient including, but not limited to bleeding, infection which may lead to sepsis or even death and damage to adjacent structures. All of the patient's questions were answered, patient is agreeable to proceed. Consent signed and in chart. CAD HTN DM  Thank you for this interesting consult.  I greatly enjoyed meeting DHRUVA ORNDOFF and look forward to participating in their care.  A copy of this report was sent to the requesting provider on this date.  SignedHedy Jacob 01/10/2015, 9:37 AM   I spent a total of 20 Minutes in face to face in clinical consultation, greater than 50% of which was counseling/coordinating care for malignant biliary obstruction

## 2015-01-10 NOTE — Procedures (Signed)
CHolangiogram, Int/ext biliary drain placement, bili brush biopsy No complication No blood loss. See complete dictation in [facility].

## 2015-01-10 NOTE — Progress Notes (Addendum)
[facility] PROGRESS NOTE  Geoffrey Gregory HWE:[id number] DOB: 04-Apr-1933 DOA: 01/09/2015 PCP: [name], MD  Summary Chart reviewed. D/w Dr. Wyline Copas. 81 yo bm transferred from [facility] for biliary drain placement by IR and further workup of pancreatic mass. Was admitted there with sodium of 111 felt to be due to SIADH, jaundice and pancreatic mass. 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 Transferring physician reportedly spoke with interventional radiology and gastroenterology, although it's unclear which gastroenterologist was contacted. Has been on hypertonic saline and hydrochlorothiazide stopped.  Assessment/Plan:  Principal Problem:   Malignant obstructive jaundice: For biliary drain placement today. WIll start diet after procedure.  Active Problems:   Pancreatic mass:  Likely unresectable pancreatic cancer per oncology's note. CA-19-9 elevated at 418. ERCP attempted with general anesthesia but patient was unable to be intubated secondary to distorted anatomy after tonsillar cancer resection. According to discharge summary from [place] was to discuss with GI and consider endoscopic ultrasound versus percutaneous biopsy. Discussed with Dr. Michail Sermon. If patient had distorted anatomy to the point he was unable to be intubated, endoscopic ultrasound would not be possible. He is recommending percutaneous cholangiogram with brushings for definitive diagnosis. Will discuss with interventional radiology.    SIADH (syndrome of inappropriate ADH production): On hypertonic saline. TSH slightly high, but so is free T4. Will check cortisol level as well.  Will continue hypertonic saline until sodium above 125, then consider to demeclocycline and/or [place]a if still a problem.    Acid reflux: On PPI    Essential (primary) hypertension:  Continue metoprolol. Would not resume thiazide.    Hypercholesteremia    Cancer of tonsil: Status post chemoradiation, in remission per oncology note. Reportedly not able to be removed due to extension beyond tonsillar fossa.    Anemia: No gross bleeding. Check anemia panel    Euthyroid sick syndrome: Would repeat thyroid function tests in a few months.   Code Status:  full Family Communication:  Wife at bedside Disposition Plan:  Home once workup complete  Consultants:  Interventional radiology  Procedures:     Antibiotics:    HPI/Subjective: No complaints.  Objective: Filed Vitals:   01/10/15 0923  BP: 159/63  Pulse: 74  Temp:   Resp:     Intake/Output Summary (Last 24 hours) at 01/10/15 0947 Last data filed at 01/10/15 0928  Gross per 24 hour  Intake    280 ml  Output    750 ml  Net   -470 ml   Filed Weights   01/09/15 1945 01/10/15 0416  Weight: 65.5 kg (144 lb 6.4 oz) 65.5 kg (144 lb 6.4 oz)    Exam:   General:  Alert, elderly in no acute distress. Oriented.  HEENT: Scleral icterus noted  Cardiovascular: Regular rate rhythm without murmurs gallops rubs  Respiratory: Clear to auscultation bilaterally without wheezes rhonchi or rales  Abdomen: Soft, nontender, nondistended  Ext: No clubbing cyanosis or edema   Basic Metabolic Panel:  Recent Labs Lab 01/09/15 2330 01/10/15 0130 01/10/15 0355 01/10/15 0545 01/10/15 0713  NA 121* 123* 122* 123* 124*  K 3.9 4.0 4.0 4.0 4.1  CL 93* 95* 93* 95* 96*  CO2 19* 21* 21* 20* 22  GLUCOSE 203* 136* 140* 145* 119*  BUN 21* 21* 21* 20 21*  CREATININE 1.21 1.20 1.23 1.19 1.23  CALCIUM 8.2* 8.3* 8.1* 8.1*  8.2*   Liver Function Tests:  Recent Labs Lab 01/07/15 1149 01/08/15 0428 01/09/15 0156 01/09/15 2108 01/10/15 0713  AST 189* 165* 214* 247* 261*  ALT 149* 130* 147* 165* 170*  ALKPHOS 871* 786* 916* 868* 759*  BILITOT 19.7* 18.5* 21.5* 20.9* 19.1*  PROT 6.4* 5.7* 6.2* 5.5* 5.1*   ALBUMIN 2.4* 2.1* 2.2* 2.0* 1.8*    Recent Labs Lab 01/07/15 1149  LIPASE 739*   No results for input(s): AMMONIA in the last 168 hours. CBC:  Recent Labs Lab 01/06/15 1033 01/07/15 1149 01/07/15 1953 01/08/15 0428 01/09/15 2108 01/10/15 0713  WBC 9.2 8.2 8.2 7.6 14.7* 13.4*  NEUTROABS 7.0 6.9*  --   --  13.5* 12.1*  HGB  --  10.1* 9.8* 8.9* 9.0* 8.3*  HCT 31.1* 29.5* 27.6* 26.3* 25.3* 23.7*  MCV  --  82.7 83.0 83.3 78.6 81.4  PLT  --  471* 450* 427 449* 415*   Cardiac Enzymes: No results for input(s): CKTOTAL, CKMB, CKMBINDEX, TROPONINI in the last 168 hours. BNP (last 3 results) No results for input(s): BNP in the last 8760 hours.  ProBNP (last 3 results) No results for input(s): PROBNP in the last 8760 hours.  CBG:  Recent Labs Lab 01/08/15 1121 01/08/15 2207 01/09/15 0721 01/09/15 1112 01/09/15 1610  GLUCAP 104* 240* 175* 178* 147*    Recent Results (from the past 240 hour(s))  Urine Culture     Status: None   Collection Time: 01/05/15 12:00 AM  Result Value Ref Range Status   Urine Culture, Routine Final report  Final   Urine Culture result 1 No growth  Final     Studies: No results found.  Scheduled Meds: . metoprolol  50 mg Oral BID  . pantoprazole  40 mg Oral Daily  . sodium chloride  3 mL Intravenous Q12H   Continuous Infusions: . sodium chloride (hypertonic) 25 mL/hr (01/10/15 0700)    Time spent: 35 minutes  [facility] [email or web address], password [facility] 01/10/2015, 9:47 AM  LOS: 1 day

## 2015-01-10 NOTE — Progress Notes (Signed)
CRITICAL VALUE ALERT  Critical value received:  Total Bilirubin 19.1  Date of notification:  01/10/2015  Time of notification:  0820  Critical value read back:Yes.    Nurse who received alert:  Allegra Lai  MD notified (1st page):  Dr. [name]  Time of first page:  [phone]  Responding MD:  Dr. [name]

## 2015-01-10 NOTE — Consult Note (Signed)
Referring Provider: No ref. provider found Primary Care Physician:  [name], MD Primary Nephrologist:    Reason for Consultation:  Hyponatremia evaluation  HPI:  Transfer from [facility] hospital for biliary drain placement for a pancreatic mass Was noted to have a serum sodium of 111 and was started on hypertonic saline . The thiazide diuretic was stopped .  Urine osm 613   Low uric acid 3.5   Urine sodium 75  Clinically euvolemic with good negative balance  Past Medical History  Diagnosis Date  . Hyperlipidemia   . 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 Hypertension   . Diabetes mellitus without complication ([place])   . CAD (coronary artery disease)   . Vitamin D deficiency   . AAA (abdominal aortic aneurysm) [facility])     Past Surgical History  Procedure Laterality Date  . Portacath placement  Fe 2015    [name]  . Axillary lymph node biopsy Left 11-28-13    atyical squamous cells  . Carotid stent  1999  . Hernia repair Bilateral 10-06-00    inguinal/ Dr. [name]  . Lymph node dissection Left 12-30-13    axilla  . Endoscopic retrograde cholangiopancreatography (ercp) with propofol N/A 01/08/2015    Procedure: ENDOSCOPIC RETROGRADE CHOLANGIOPANCREATOGRAPHY (ERCP) WITH PROPOFOL;  Surgeon: Lucilla Lame, MD;  Location: [facility] ENDOSCOPY;  Service: Endoscopy;  Laterality: N/A;    Prior to Admission medications   Medication Sig Start Date End Date Taking? Authorizing Provider  acetaminophen (TYLENOL) 500 MG tablet Take 500 mg by mouth every 6 (six) hours as needed.   Yes Historical Provider, MD  aspirin 81 MG tablet Take by mouth daily. 06/08/11  Yes Historical Provider, MD  cholecalciferol (VITAMIN D) 1000 UNITS tablet Take 1,000 Units by mouth daily.   Yes Historical Provider, MD  lovastatin (MEVACOR) 40 MG tablet Take 40 mg by mouth at bedtime.   Yes Historical Provider, MD  metoprolol (LOPRESSOR) 50 MG tablet Take 1 tablet (50 mg total) by mouth 2 (two) times daily.  10/17/14  Yes [name], MD  omeprazole (PRILOSEC) 40 MG capsule Take 40 mg by mouth daily.  12/11/13  Yes Historical Provider, MD    Current Facility-Administered Medications  Medication Dose Route Frequency Provider Last Rate Last Dose  . fentaNYL (SUBLIMAZE) 100 MCG/2ML injection           . HYDROcodone-acetaminophen (NORCO/VICODIN) 5-325 MG per tablet 1-2 tablet  1-2 tablet Oral Q4H PRN Arne Cleveland, MD      . iohexol (OMNIPAQUE) 300 MG/ML solution 50 mL  50 mL Other Once PRN Medication Radiologist, MD   20 mL at 01/10/15 1206  . lidocaine (XYLOCAINE) 1 % (with pres) injection           . metoprolol (LOPRESSOR) tablet 50 mg  50 mg Oral BID Lavina Hamman, MD   50 mg at 01/10/15 2778  . midazolam (VERSED) 2 MG/2ML injection           . ondansetron (ZOFRAN) tablet 4 mg  4 mg Oral Q6H PRN Lavina Hamman, MD       Or  . ondansetron [facility]) injection 4 mg  4 mg Intravenous Q6H PRN Lavina Hamman, MD      . pantoprazole (PROTONIX) EC tablet 40 mg  40 mg Oral Daily Lavina Hamman, MD   40 mg at 01/10/15 2423  . sodium chloride (hypertonic) 3 % solution   Intravenous Continuous Lavina Hamman,  MD 25 mL/hr at 01/10/15 0700 25 mL/hr at 01/10/15 0700  . sodium chloride 0.9 % injection 3 mL  3 mL Intravenous Q12H Lavina Hamman, MD   3 mL at 01/09/15 2234  . sodium chloride 0.9 % injection 5 mL  5 mL Intracatheter Q8H Arne Cleveland, MD       Facility-Administered Medications Ordered in Other Encounters  Medication Dose Route Frequency Provider Last Rate Last Dose  . sodium chloride 0.9 % injection 10 mL  10 mL Intravenous PRN Evlyn Kanner, NP   10 mL at 12/05/14 1103    Allergies as of 01/09/2015  . (No Known Allergies)    Family History  Problem Relation Age of Onset  . Diabetes Mother   . Heart disease Mother   . Stroke Sister   . Kidney disease Sister     kidney failure  . Hypertension Brother     Social History   Social History  . Marital Status: Married     Spouse Name: N/A  . Number of Children: N/A  . Years of Education: N/A   Occupational History  . Not on file.   Social History Main Topics  . Smoking status: Former Smoker -- 1.00 packs/day for 15 years    Types: Cigarettes    Quit date: 04/11/1997  . Smokeless tobacco: Never Used  . Alcohol Use: No  . Drug Use: No  . Sexual Activity: Not on file   Other Topics Concern  . Not on file   Social History Narrative    Review of Systems: Gen: Denies any fever, chills, sweats, anorexia, fatigue, weakness, malaise, weight loss, and sleep disorder HEENT: No visual complaints, No history of Retinopathy. Normal external appearance No Epistaxis or Sore throat. No sinusitis.   CV: Denies chest pain, angina, palpitations, syncope, orthopnea, PND, peripheral edema, and claudication. Resp: Denies dyspnea at rest, dyspnea with exercise, cough, sputum, wheezing, coughing up blood, and pleurisy. GI: Denies vomiting blood, jaundice, and fecal incontinence.   Denies dysphagia or odynophagia. GU : Denies urinary burning, blood in urine, urinary frequency, urinary hesitancy, nocturnal urination, and urinary incontinence.  No renal calculi. MS: Denies joint pain, limitation of movement, and swelling, stiffness, low back pain, extremity pain. Denies muscle weakness, cramps, atrophy.  No use of non steroidal antiinflammatory drugs. Derm: Denies rash, itching, dry skin, hives, moles, warts, or unhealing ulcers.  Psych: Denies depression, anxiety, memory loss, suicidal ideation, hallucinations, paranoia, and confusion. Heme: Denies bruising, bleeding, and enlarged lymph nodes. Neuro: No headache.  No diplopia. No dysarthria.  No dysphasia.  No history of CVA.  No Seizures. No paresthesias.  No weakness. Endocrine No DM.  No Thyroid disease.  No Adrenal disease.  Physical Exam: Vital signs in last 24 hours: Temp:  [97.7 F (36.5 C)-98.6 F (37 C)] 97.8 F (36.6 C) (10/01 1530) Pulse Rate:  [57-74] 70  (10/01 1400) Resp:  [10-100] 20 (10/01 1400) BP: (129-198)/(57-86) 154/59 mmHg (10/01 1400) SpO2:  [97 %-100 %] 98 % (10/01 1400) Weight:  [65.5 kg (144 lb 6.4 oz)] 65.5 kg (144 lb 6.4 oz) (10/01 0416) Last BM Date: 01/09/15  Head:  Normocephalic and atraumatic. Eyes:  Sclera clear, no icterus.   Conjunctiva pink. Ears:  Normal auditory acuity. Nose:  No deformity, discharge,  or lesions. Mouth:  No deformity or lesions, dentition normal. Neck:  Supple; no masses or thyromegaly. JVP not elevated Lungs:  Clear throughout to auscultation.   No wheezes, crackles, or rhonchi. No acute  distress. Heart:  Regular rate and rhythm; no murmurs, clicks, rubs,  or gallops. Abdomen:  Soft, nontender and nondistended. No masses, hepatosplenomegaly or hernias noted. Normal bowel sounds, without guarding, and without rebound.   Msk:  Symmetrical without gross deformities. Normal posture. Pulses:  No carotid, renal, femoral bruits. DP and PT symmetrical and equal Extremities:  Without clubbing or edema. Neurologic:  Alert and  oriented x4;  grossly normal neurologically. Skin:  Intact without significant lesions or rashes. Cervical Nodes:  No significant cervical adenopathy. Psych:  Alert and cooperative. Normal mood and affect.  Intake/Output from previous day: 09/30 0701 - 10/01 0700 In: 250 [I.V.:250] Out: 550 [Urine:550] Intake/Output this shift: Total I/O In: 440 [P.O.:390; IV Piggyback:50] Out: 600 [Urine:600]  Lab Results:  Recent Labs  01/08/15 0428 01/09/15 2108 01/10/15 0713  WBC 7.6 14.7* 13.4*  HGB 8.9* 9.0* 8.3*  HCT 26.3* 25.3* 23.7*  PLT 427 449* 415*   BMET  Recent Labs  01/10/15 0545 01/10/15 0713 01/10/15 1145  NA 123* 124* 124*  K 4.0 4.1 4.1  CL 95* 96* 95*  CO2 20* 22 21*  GLUCOSE 145* 119* 136*  BUN 20 21* 20  CREATININE 1.19 1.23 1.15  CALCIUM 8.1* 8.2* 8.1*   LFT  Recent Labs  01/10/15 0713  PROT 5.1*  ALBUMIN 1.8*  AST 261*  ALT 170*   ALKPHOS 759*  BILITOT 19.1*  BILIDIR 12.5*  IBILI 6.6*   PT/INR  Recent Labs  01/09/15 1417 01/09/15 2108  LABPROT 14.2 15.3*  INR 1.08 1.19   Hepatitis Panel No results for input(s): HEPBSAG, HCVAB, HEPAIGM, HEPBIGM in the last 72 hours.  Studies/Results: Ir Int Lianne Cure Biliary Drain With Cholangiogram  01/10/2015   INDICATION: Obstructing pancreatic mass. Intra and extrahepatic biliary ductal dilatation. Remote history of tonsillar carcinoma with resultant stricturing, complicating attempts at endoscopy and intubation.  EXAM: ULTRASOUND AND FLUOROSCOIC GUIDED PERCUTANEOUS TRANSHEPATIC CHOLANGIOGRAM  INTERNAL/EXTERNAL BILIARY DRAIN TUBE PLACEMENT  BILIARY BRUSH BIOPSY  COMPARISON:  MR 01/07/2015 AND EARLIER STUDIES  MEDICATIONS: cefazolin 2g preprocedural  CONTRAST:  3mL OMNIPAQUE IOHEXOL 300 MG/ML  SOLN  ANESTHESIA/SEDATION: Intravenous Fentanyl and Versed were administered as conscious sedation during continuous cardiorespiratory monitoring by the radiology RN, with a total moderate sedation time of 32 minutes.  FLUOROSCOPY TIME:  8 minutes 24 seconds, 419   mGy.  COMPLICATIONS: None immediate  TECHNIQUE: Informed written consent was obtained from Geoffrey Gregory after a discussion of the risks, benefits and alternatives to treatment. Questions regarding the procedure were encouraged and answered. A timeout was performed prior to the initiation of the procedure.  The right upper abdominal quadrant was prepped and draped in the usual sterile fashion, and a sterile drape was applied covering the operative field. Maximum barrier sterile technique with sterile gowns and gloves were used for the procedure. A timeout was performed prior to the initiation of the procedure.  Ultrasound scanning of the right upper abdominal quadrant was performed to delineate the anatomy and avoid transgression of the gallbladder or the pleural. An entry site in the right epigastric region was identified .  After the  overlying soft tissues were anesthetized with 1% Lidocaine , a 21 gauge micropuncture needle was utilized to access the peripheral aspect of an anterior left hepatic duct. A 018 guidewire advanced centrally easily. A 3 French micro dilator was placed over the guidewire and contrast injection confirmed appropriate positioning in the biliary tree. The micro dilator was exchanged for a transitional dilator, exchanged for  a Kumpe catheter over a Careers adviser. With the use of a regular glide wire, the Kumpe catheter was advanced central to an apparent CBD structure, and ultimately into the distal duodenum.  Over an Amplatz stiff wire, the tract was dilated and a 9 [place] long peel-away sheath was advanced. The dilator was removed and biliary brush biopsy of the CBD obstructive region performed x2, submitted to cytology. The peel-away was then exchanged for a 10.2 [place] biliary drainage catheter, advanced with coil ultimately locked within the duodenum. Contrast was injected and a completion radiograph was obtained. The catheter was connected to a drainage bag which yielded the brisk return of clear bile. The catheter was secured to the skin with an 0 Prolene suture and StatLock. The patient tolerated the procedure well without immediate postprocedural complication.  FINDINGS: The initial percutaneous transhepatic cholangiogram demonstrates marked intrahepatic biliary ductal dilatation. The proximal common duct just beyond the biliary confluence is patent. There is a long irregular stricture of the mid and distal CBD, with associated mucosal irregularity. The duodenum is decompressed.  Percutaneous biliary brush biopsy of the CBD lesion was performed x2.  10 French internal external biliary drain catheter was placed to the duodenum without complication.  IMPRESSION: 1. Percutaneous transhepatic cholangiogram demonstrating marked intrahepatic biliary ductal dilatation, long segment CBD irregularity and narrowing. 2.  Technically successful brush biopsy of CBD lesion. 3. Technically successful internal external biliary drain catheter placement. The catheter was left to external drainage to maximize biliary decompression.   Electronically Signed   By: Lucrezia Europe M.D.   On: 01/10/2015 13:00   Ir Endoluminal Bx Of Biliary Tree  01/10/2015   INDICATION: Obstructing pancreatic mass. Intra and extrahepatic biliary ductal dilatation. Remote history of tonsillar carcinoma with resultant stricturing, complicating attempts at endoscopy and intubation.  EXAM: ULTRASOUND AND FLUOROSCOIC GUIDED PERCUTANEOUS TRANSHEPATIC CHOLANGIOGRAM  INTERNAL/EXTERNAL BILIARY DRAIN TUBE PLACEMENT  BILIARY BRUSH BIOPSY  COMPARISON:  MR 01/07/2015 AND EARLIER STUDIES  MEDICATIONS: cefazolin 2g preprocedural  CONTRAST:  77mL OMNIPAQUE IOHEXOL 300 MG/ML  SOLN  ANESTHESIA/SEDATION: Intravenous Fentanyl and Versed were administered as conscious sedation during continuous cardiorespiratory monitoring by the radiology RN, with a total moderate sedation time of 32 minutes.  FLUOROSCOPY TIME:  8 minutes 24 seconds, 419   mGy.  COMPLICATIONS: None immediate  TECHNIQUE: Informed written consent was obtained from Geoffrey Gregory after a discussion of the risks, benefits and alternatives to treatment. Questions regarding the procedure were encouraged and answered. A timeout was performed prior to the initiation of the procedure.  The right upper abdominal quadrant was prepped and draped in the usual sterile fashion, and a sterile drape was applied covering the operative field. Maximum barrier sterile technique with sterile gowns and gloves were used for the procedure. A timeout was performed prior to the initiation of the procedure.  Ultrasound scanning of the right upper abdominal quadrant was performed to delineate the anatomy and avoid transgression of the gallbladder or the pleural. An entry site in the right epigastric region was identified .  After the overlying  soft tissues were anesthetized with 1% Lidocaine , a 21 gauge micropuncture needle was utilized to access the peripheral aspect of an anterior left hepatic duct. A 018 guidewire advanced centrally easily. A 3 French micro dilator was placed over the guidewire and contrast injection confirmed appropriate positioning in the biliary tree. The micro dilator was exchanged for a transitional dilator, exchanged for a Kumpe catheter over a Benson wire. With the use of a  regular glide wire, the Kumpe catheter was advanced central to an apparent CBD structure, and ultimately into the distal duodenum.  Over an Amplatz stiff wire, the tract was dilated and a 9 [place] long peel-away sheath was advanced. The dilator was removed and biliary brush biopsy of the CBD obstructive region performed x2, submitted to cytology. The peel-away was then exchanged for a 10.2 [place] biliary drainage catheter, advanced with coil ultimately locked within the duodenum. Contrast was injected and a completion radiograph was obtained. The catheter was connected to a drainage bag which yielded the brisk return of clear bile. The catheter was secured to the skin with an 0 Prolene suture and StatLock. The patient tolerated the procedure well without immediate postprocedural complication.  FINDINGS: The initial percutaneous transhepatic cholangiogram demonstrates marked intrahepatic biliary ductal dilatation. The proximal common duct just beyond the biliary confluence is patent. There is a long irregular stricture of the mid and distal CBD, with associated mucosal irregularity. The duodenum is decompressed.  Percutaneous biliary brush biopsy of the CBD lesion was performed x2.  10 French internal external biliary drain catheter was placed to the duodenum without complication.  IMPRESSION: 1. Percutaneous transhepatic cholangiogram demonstrating marked intrahepatic biliary ductal dilatation, long segment CBD irregularity and narrowing. 2. Technically  successful brush biopsy of CBD lesion. 3. Technically successful internal external biliary drain catheter placement. The catheter was left to external drainage to maximize biliary decompression.   Electronically Signed   By: Lucrezia Europe M.D.   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 Agree with checking cortisol  Free water restrict  Will change to normal saline and start lasix  LOS: 1 Geoffrey Gregory @TODAY @3 :55 PM

## 2015-01-11 DIAGNOSIS — K831 Obstruction of bile duct: Secondary | ICD-10-CM | POA: Insufficient documentation

## 2015-01-11 DIAGNOSIS — N179 Acute kidney failure, unspecified: Secondary | ICD-10-CM | POA: Diagnosis present

## 2015-01-11 LAB — CBC WITH DIFFERENTIAL/PLATELET
BASOS PCT: 0 %
Basophils Absolute: 0 10*3/uL (ref 0.0–0.1)
EOS ABS: 0.1 10*3/uL (ref 0.0–0.7)
Eosinophils Relative: 1 %
HCT: 26 % — ABNORMAL LOW (ref 39.0–52.0)
HEMOGLOBIN: 9.1 g/dL — AB (ref 13.0–17.0)
Lymphocytes Relative: 5 %
Lymphs Abs: 0.6 10*3/uL — ABNORMAL LOW (ref 0.7–4.0)
MCH: 28.9 pg (ref 26.0–34.0)
MCHC: 35 g/dL (ref 30.0–36.0)
MCV: 82.5 fL (ref 78.0–100.0)
Monocytes Absolute: 0.9 10*3/uL (ref 0.1–1.0)
Monocytes Relative: 8 %
NEUTROS PCT: 86 %
Neutro Abs: 9.3 10*3/uL — ABNORMAL HIGH (ref 1.7–7.7)
Platelets: 409 10*3/uL — ABNORMAL HIGH (ref 150–400)
RBC: 3.15 MIL/uL — AB (ref 4.22–5.81)
RDW: 17.7 % — ABNORMAL HIGH (ref 11.5–15.5)
WBC: 10.7 10*3/uL — AB (ref 4.0–10.5)

## 2015-01-11 LAB — IRON AND TIBC
Iron: 41 ug/dL — ABNORMAL LOW (ref 45–182)
Saturation Ratios: 15 % — ABNORMAL LOW (ref 17.9–39.5)
TIBC: 266 ug/dL (ref 250–450)
UIBC: 225 ug/dL

## 2015-01-11 LAB — BASIC METABOLIC PANEL
Anion gap: 10 (ref 5–15)
BUN: 23 mg/dL — ABNORMAL HIGH (ref 6–20)
CHLORIDE: 90 mmol/L — AB (ref 101–111)
CO2: 24 mmol/L (ref 22–32)
CREATININE: 1.36 mg/dL — AB (ref 0.61–1.24)
Calcium: 7.9 mg/dL — ABNORMAL LOW (ref 8.9–10.3)
GFR, EST AFRICAN AMERICAN: 55 mL/min — AB (ref >60)
GFR, EST NON AFRICAN AMERICAN: 48 mL/min — AB (ref >60)
Glucose, Bld: 133 mg/dL — ABNORMAL HIGH (ref 65–99)
POTASSIUM: 3.4 mmol/L — AB (ref 3.5–5.1)
SODIUM: 124 mmol/L — AB (ref 135–145)

## 2015-01-11 LAB — COMPREHENSIVE METABOLIC PANEL
ALK PHOS: 754 U/L — AB (ref 38–126)
ALT: 156 U/L — AB (ref 17–63)
AST: 192 U/L — ABNORMAL HIGH (ref 15–41)
Albumin: 2 g/dL — ABNORMAL LOW (ref 3.5–5.0)
Anion gap: 7 (ref 5–15)
BUN: 23 mg/dL — ABNORMAL HIGH (ref 6–20)
CALCIUM: 8.3 mg/dL — AB (ref 8.9–10.3)
CHLORIDE: 96 mmol/L — AB (ref 101–111)
CO2: 24 mmol/L (ref 22–32)
CREATININE: 1.41 mg/dL — AB (ref 0.61–1.24)
GFR, EST AFRICAN AMERICAN: 53 mL/min — AB (ref >60)
GFR, EST NON AFRICAN AMERICAN: 46 mL/min — AB (ref >60)
Glucose, Bld: 129 mg/dL — ABNORMAL HIGH (ref 65–99)
Potassium: 3.9 mmol/L (ref 3.5–5.1)
Sodium: 127 mmol/L — ABNORMAL LOW (ref 135–145)
TOTAL PROTEIN: 5.7 g/dL — AB (ref 6.5–8.1)
Total Bilirubin: 14.3 mg/dL — ABNORMAL HIGH (ref 0.3–1.2)

## 2015-01-11 LAB — RETICULOCYTES
RBC.: 3.15 MIL/uL — ABNORMAL LOW (ref 4.22–5.81)
RETIC CT PCT: 3 % (ref 0.4–3.1)
Retic Count, Absolute: 94.5 10*3/uL (ref 19.0–186.0)

## 2015-01-11 LAB — FOLATE: Folate: 21.1 ng/mL (ref >5.9)

## 2015-01-11 LAB — UREA NITROGEN, URINE: UREA NITROGEN UR: 825 mg/dL

## 2015-01-11 LAB — FERRITIN: FERRITIN: 708 ng/mL — AB (ref 24–336)

## 2015-01-11 LAB — CORTISOL-AM, BLOOD: CORTISOL - AM: 17.3 ug/dL (ref 6.7–22.6)

## 2015-01-11 LAB — VITAMIN B12: Vitamin B-12: 1089 pg/mL — ABNORMAL HIGH (ref 180–914)

## 2015-01-11 MED ORDER — FUROSEMIDE 10 MG/ML IJ SOLN
40.0000 mg | Freq: Two times a day (BID) | INTRAMUSCULAR | Status: DC
  Administered 2015-01-12: 40 mg via INTRAVENOUS
  Filled 2015-01-11: qty 4

## 2015-01-11 MED ORDER — SODIUM CHLORIDE 0.9 % IJ SOLN
10.0000 mL | INTRAMUSCULAR | Status: DC | PRN
  Administered 2015-01-11: 20 mL
  Administered 2015-01-13 – 2015-01-22 (×6): 10 mL
  Filled 2015-01-11 (×7): qty 40

## 2015-01-11 MED ORDER — HEPARIN SODIUM (PORCINE) 5000 UNIT/ML IJ SOLN
5000.0000 [IU] | Freq: Three times a day (TID) | INTRAMUSCULAR | Status: DC
  Administered 2015-01-11 – 2015-01-13 (×6): 5000 [IU] via SUBCUTANEOUS
  Filled 2015-01-11 (×5): qty 1

## 2015-01-11 NOTE — Progress Notes (Signed)
Patient to transfer to 6E22 report given to receiving nurse Carlyn Reichert all questions answered at this time.  Pt. VSS with no s/s of distress noted.  Patient stable at transfer.

## 2015-01-11 NOTE — Progress Notes (Addendum)
PROGRESS NOTE  Geoffrey Gregory YYQ:[id number] DOB: February 06, 1933 DOA: 01/09/2015 PCP: [name], MD  HPI: 81 yo bm transferred from [facility] for biliary drain placement by IR and further workup of pancreatic mass. Was admitted there with sodium of 111 felt to be due to SIADH, jaundice and pancreatic mass. ERCP attempted but because of history of tonsillar cancer, patient was not able to be intubated for procedure and the procedure was aborted  Subjective / 24 H Interval events - without complaints, feeling better - no chest pain, shortness of breath, no abdominal pain, nausea or vomiting.   Assessment/Plan: Principal Problem:   Malignant obstructive jaundice Active Problems:   Dysphagia, oropharyngeal   Acid reflux   Essential (primary) hypertension   Hypercholesteremia   Cancer of tonsil (HCC)   Pancreatic mass   SIADH (syndrome of inappropriate ADH production) (HCC)   Anemia   Hyperbilirubinemia   Euthyroid sick syndrome   AKI (acute kidney injury) ([place])  Malignant obstructive jaundice - s/p biliary drain placement 10/1 by IR and brush biopsy  - bilirubin improving  Pancreatic mass  - Likely unresectable pancreatic cancer per oncology's note. CA-19-9 elevated at 418. ERCP attempted with general anesthesia but patient was unable to be intubated secondary to distorted anatomy after tonsillar cancer resection. According to discharge summary from [place] was to discuss with GI and consider endoscopic ultrasound versus percutaneous biopsy. Dr. [name]  discussed with Dr. Michail Sermon. If patient had distorted anatomy to the point he was unable to be intubated, endoscopic ultrasound would not be possible.   SIADH (syndrome of inappropriate ADH production)  - On hypertonic saline initially, now transitioned to normal saline and Lasix per nephrology  - TSH slightly high, but so free T4. Will check cortisol level as  well.  AKI - likely due to Lasix, will hold this afternoon dose and resume in am - repeat BMP in am  Acid reflux - On PPI  Essential (primary) hypertension - Continue metoprolol. Would not resume thiazide.  Hypercholesteremia  Cancer of tonsil  - Status post chemoradiation, in remission per oncology note. Reportedly not able to be removed due to extension beyond tonsillar fossa.  Anemia - No gross bleeding. Check anemia panel  Euthyroid sick syndrome  - Would repeat thyroid function tests in a few months.   Diet: Diet Carb Modified Fluid consistency:: Thin; Room service appropriate?: Yes; Fluid restriction:: 1500 mL Fluid Fluids: NS DVT Prophylaxis: start heparin s.q.  Code Status: Full Code Family Communication: d/w wife bedside  Disposition Plan: transfer to telemetry, d/c home 1-2 days  Consultants:  Nephrology  IR  Procedures:  Biliary drain placement 10/1   Antibiotics Cefazolin peri procedural   Studies  Ir Int Ext Biliary Drain With Cholangiogram  01/10/2015   INDICATION: Obstructing pancreatic mass. Intra and extrahepatic biliary ductal dilatation. Remote history of tonsillar carcinoma with resultant stricturing, complicating attempts at endoscopy and intubation.  EXAM: ULTRASOUND AND FLUOROSCOIC GUIDED PERCUTANEOUS TRANSHEPATIC CHOLANGIOGRAM  INTERNAL/EXTERNAL BILIARY DRAIN TUBE PLACEMENT  BILIARY BRUSH BIOPSY  COMPARISON:  MR 01/07/2015 AND EARLIER STUDIES  MEDICATIONS: cefazolin 2g preprocedural  CONTRAST:  91mL OMNIPAQUE IOHEXOL 300 MG/ML  SOLN  ANESTHESIA/SEDATION: Intravenous Fentanyl and Versed were administered as conscious sedation during continuous cardiorespiratory monitoring by the radiology RN, with a total moderate sedation time of 32 minutes.  FLUOROSCOPY TIME:  8 minutes 24 seconds, 419   mGy.  COMPLICATIONS: None immediate  TECHNIQUE: Informed written consent was obtained  from [place] after a discussion of the risks, benefits and  alternatives to treatment. Questions regarding the procedure were encouraged and answered. A timeout was performed prior to the initiation of the procedure.  The right upper abdominal quadrant was prepped and draped in the usual sterile fashion, and a sterile drape was applied covering the operative field. Maximum barrier sterile technique with sterile gowns and gloves were used for the procedure. A timeout was performed prior to the initiation of the procedure.  Ultrasound scanning of the right upper abdominal quadrant was performed to delineate the anatomy and avoid transgression of the gallbladder or the pleural. An entry site in the right epigastric region was identified .  After the overlying soft tissues were anesthetized with 1% Lidocaine , a 21 gauge micropuncture needle was utilized to access the peripheral aspect of an anterior left hepatic duct. A 018 guidewire advanced centrally easily. A 3 French micro dilator was placed over the guidewire and contrast injection confirmed appropriate positioning in the biliary tree. The micro dilator was exchanged for a transitional dilator, exchanged for a Kumpe catheter over a Benson wire. With the use of a regular glide wire, the Kumpe catheter was advanced central to an apparent CBD structure, and ultimately into the distal duodenum.  Over an Amplatz stiff wire, the tract was dilated and a 9 [place] long peel-away sheath was advanced. The dilator was removed and biliary brush biopsy of the CBD obstructive region performed x2, submitted to cytology. The peel-away was then exchanged for a 10.2 [place] biliary drainage catheter, advanced with coil ultimately locked within the duodenum. Contrast was injected and a completion radiograph was obtained. The catheter was connected to a drainage bag which yielded the brisk return of clear bile. The catheter was secured to the skin with an 0 Prolene suture and StatLock. The patient tolerated the procedure well without immediate  postprocedural complication.  FINDINGS: The initial percutaneous transhepatic cholangiogram demonstrates marked intrahepatic biliary ductal dilatation. The proximal common duct just beyond the biliary confluence is patent. There is a long irregular stricture of the mid and distal CBD, with associated mucosal irregularity. The duodenum is decompressed.  Percutaneous biliary brush biopsy of the CBD lesion was performed x2.  10 French internal external biliary drain catheter was placed to the duodenum without complication.  IMPRESSION: 1. Percutaneous transhepatic cholangiogram demonstrating marked intrahepatic biliary ductal dilatation, long segment CBD irregularity and narrowing. 2. Technically successful brush biopsy of CBD lesion. 3. Technically successful internal external biliary drain catheter placement. The catheter was left to external drainage to maximize biliary decompression.   Electronically Signed   By: Lucrezia Europe M.D.   On: 01/10/2015 13:00   Ir Endoluminal Bx Of Biliary Tree  01/10/2015   INDICATION: Obstructing pancreatic mass. Intra and extrahepatic biliary ductal dilatation. Remote history of tonsillar carcinoma with resultant stricturing, complicating attempts at endoscopy and intubation.  EXAM: ULTRASOUND AND FLUOROSCOIC GUIDED PERCUTANEOUS TRANSHEPATIC CHOLANGIOGRAM  INTERNAL/EXTERNAL BILIARY DRAIN TUBE PLACEMENT  BILIARY BRUSH BIOPSY  COMPARISON:  MR 01/07/2015 AND EARLIER STUDIES  MEDICATIONS: cefazolin 2g preprocedural  CONTRAST:  28mL OMNIPAQUE IOHEXOL 300 MG/ML  SOLN  ANESTHESIA/SEDATION: Intravenous Fentanyl and Versed were administered as conscious sedation during continuous cardiorespiratory monitoring by the radiology RN, with a total moderate sedation time of 32 minutes.  FLUOROSCOPY TIME:  8 minutes 24 seconds, 419   mGy.  COMPLICATIONS: None immediate  TECHNIQUE: Informed written consent was obtained from [place] after a discussion of the risks, benefits and  alternatives to  treatment. Questions regarding the procedure were encouraged and answered. A timeout was performed prior to the initiation of the procedure.  The right upper abdominal quadrant was prepped and draped in the usual sterile fashion, and a sterile drape was applied covering the operative field. Maximum barrier sterile technique with sterile gowns and gloves were used for the procedure. A timeout was performed prior to the initiation of the procedure.  Ultrasound scanning of the right upper abdominal quadrant was performed to delineate the anatomy and avoid transgression of the gallbladder or the pleural. An entry site in the right epigastric region was identified .  After the overlying soft tissues were anesthetized with 1% Lidocaine , a 21 gauge micropuncture needle was utilized to access the peripheral aspect of an anterior left hepatic duct. A 018 guidewire advanced centrally easily. A 3 French micro dilator was placed over the guidewire and contrast injection confirmed appropriate positioning in the biliary tree. The micro dilator was exchanged for a transitional dilator, exchanged for a Kumpe catheter over a Benson wire. With the use of a regular glide wire, the Kumpe catheter was advanced central to an apparent CBD structure, and ultimately into the distal duodenum.  Over an Amplatz stiff wire, the tract was dilated and a 9 [place] long peel-away sheath was advanced. The dilator was removed and biliary brush biopsy of the CBD obstructive region performed x2, submitted to cytology. The peel-away was then exchanged for a 10.2 [place] biliary drainage catheter, advanced with coil ultimately locked within the duodenum. Contrast was injected and a completion radiograph was obtained. The catheter was connected to a drainage bag which yielded the brisk return of clear bile. The catheter was secured to the skin with an 0 Prolene suture and StatLock. The patient tolerated the procedure well without immediate postprocedural  complication.  FINDINGS: The initial percutaneous transhepatic cholangiogram demonstrates marked intrahepatic biliary ductal dilatation. The proximal common duct just beyond the biliary confluence is patent. There is a long irregular stricture of the mid and distal CBD, with associated mucosal irregularity. The duodenum is decompressed.  Percutaneous biliary brush biopsy of the CBD lesion was performed x2.  10 French internal external biliary drain catheter was placed to the duodenum without complication.  IMPRESSION: 1. Percutaneous transhepatic cholangiogram demonstrating marked intrahepatic biliary ductal dilatation, long segment CBD irregularity and narrowing. 2. Technically successful brush biopsy of CBD lesion. 3. Technically successful internal external biliary drain catheter placement. The catheter was left to external drainage to maximize biliary decompression.   Electronically Signed   By: Lucrezia Europe M.D.   On: 01/10/2015 13:00    Objective  Filed Vitals:   01/10/15 1948 01/10/15 2329 01/11/15 0422 01/11/15 0702  BP: 129/60 133/66 149/62 136/69  Pulse: 70 77 70 77  Temp: 98.2 F (36.8 C) 98.3 F (36.8 C) 98 F (36.7 C) 98.2 F (36.8 C)  TempSrc: Oral Oral Oral Oral  Resp: 15 20 17 20   Height:      Weight:   63.6 kg (140 lb 3.4 oz)   SpO2: 98% 99% 97% 98%    Intake/Output Summary (Last 24 hours) at 01/11/15 1044 Last data filed at 01/11/15 0800  Gross per 24 hour  Intake 1405.83 ml  Output   2235 ml  Net -829.17 ml   Filed Weights   01/09/15 1945 01/10/15 0416 01/11/15 0422  Weight: 65.5 kg (144 lb 6.4 oz) 65.5 kg (144 lb 6.4 oz) 63.6 kg (140 lb 3.4 oz)  Exam:  GENERAL: NAD  HEENT: head NCAT, + scleral icterus. Pupils round and reactive. Mucous membranes are moist.   LUNGS: Clear to auscultation. No wheezing or crackles  HEART: Regular rate and rhythm without murmur. 2+ pulses, no JVD, no peripheral edema  ABDOMEN: Soft, nontender, and nondistended. Positive  bowel sounds. Biliary drain in place  EXTREMITIES: Without any cyanosis, clubbing  NEUROLOGIC: non focal   PSYCHIATRIC: Normal mood and affect  Data Reviewed: Basic Metabolic Panel:  Recent Labs Lab 01/10/15 0713 01/10/15 1145 01/10/15 1622 01/10/15 2040 01/11/15 0430  NA 124* 124* 124* 124* 127*  K 4.1 4.1 4.0 3.7 3.9  CL 96* 95* 95* 92* 96*  CO2 22 21* 22 23 24   GLUCOSE 119* 136* 147* 159* 129*  BUN 21* 20 21* 21* 23*  CREATININE 1.23 1.15 1.21 1.30* 1.41*  CALCIUM 8.2* 8.1* 8.1* 8.3* 8.3*   Liver Function Tests:  Recent Labs Lab 01/08/15 0428 01/09/15 0156 01/09/15 2108 01/10/15 0713 01/11/15 0430  AST 165* 214* 247* 261* 192*  ALT 130* 147* 165* 170* 156*  ALKPHOS 786* 916* 868* 759* 754*  BILITOT 18.5* 21.5* 20.9* 19.1* 14.3*  PROT 5.7* 6.2* 5.5* 5.1* 5.7*  ALBUMIN 2.1* 2.2* 2.0* 1.8* 2.0*    Recent Labs Lab 01/07/15 1149  LIPASE 739*   CBC:  Recent Labs Lab 01/06/15 1033  01/07/15 1149 01/07/15 1953 01/08/15 0428 01/09/15 2108 01/10/15 0713 01/11/15 0430  WBC 9.2  < > 8.2 8.2 7.6 14.7* 13.4* 10.7*  NEUTROABS 7.0  --  6.9*  --   --  13.5* 12.1* 9.3*  HGB  --   < > 10.1* 9.8* 8.9* 9.0* 8.3* 9.1*  HCT 31.1*  < > 29.5* 27.6* 26.3* 25.3* 23.7* 26.0*  MCV  --   < > 82.7 83.0 83.3 78.6 81.4 82.5  PLT  --   < > 471* 450* 427 449* 415* 409*  < > = values in this interval not displayed.  CBG:  Recent Labs Lab 01/08/15 1121 01/08/15 2207 01/09/15 0721 01/09/15 1112 01/09/15 1610  GLUCAP 104* 240* 175* 178* 147*    Recent Results (from the past 240 hour(s))  Urine Culture     Status: None   Collection Time: 01/05/15 12:00 AM  Result Value Ref Range Status   Urine Culture, Routine Final report  Final   Urine Culture result 1 No growth  Final     Scheduled Meds: . [START ON 01/12/2015] furosemide  40 mg Intravenous Q12H  . metoprolol  50 mg Oral BID  . pantoprazole  40 mg Oral Daily  . sodium chloride  3 mL Intravenous Q12H  . sodium  chloride  5 mL Intracatheter Q8H   Continuous Infusions: . sodium chloride 75 mL/hr at 01/11/15 0800     Marzetta Board, MD [facility] Pager [phone]. If 7 PM - 7 AM, please contact night-coverage at [email or web address], password [facility] 01/11/2015, 10:44 AM  LOS: 2 days

## 2015-01-11 NOTE — Progress Notes (Signed)
Referring Physician(s): [facility]/GI  Chief Complaint: Malignant Biliary obstruction s/p internal/external biliary drain and brush biopsy 10/1  Subjective: Patient denies any abdominal pain states he feels less distended today. He denies any fever or chills. He denies any nausea.   Allergies: Review of patient's allergies indicates no known allergies.  Medications: Prior to Admission medications   Medication Sig Start Date End Date Taking? Authorizing Provider  acetaminophen (TYLENOL) 500 MG tablet Take 500 mg by mouth every 6 (six) hours as needed.   Yes Historical Provider, MD  aspirin 81 MG tablet Take by mouth daily. 06/08/11  Yes Historical Provider, MD  cholecalciferol (VITAMIN D) 1000 UNITS tablet Take 1,000 Units by mouth daily.   Yes Historical Provider, MD  lovastatin (MEVACOR) 40 MG tablet Take 40 mg by mouth at bedtime.   Yes Historical Provider, MD  metoprolol (LOPRESSOR) 50 MG tablet Take 1 tablet (50 mg total) by mouth 2 (two) times daily. 10/17/14  Yes [name], MD  omeprazole (PRILOSEC) 40 MG capsule Take 40 mg by mouth daily.  12/11/13  Yes Historical Provider, MD   Vital Signs: BP 136/69 mmHg  Pulse 77  Temp(Src) 98 F (36.7 C) (Oral)  Resp 20  Ht 5\' 7"  (1.702 m)  Wt 140 lb 3.4 oz (63.6 kg)  BMI 21.96 kg/m2  SpO2 98%  Physical Exam General: A&Ox3, NAD, eating Abd: Soft, NT, ND, RUQ biliary drain intact-NT, 50 cc light bilious output in bag  Imaging: Mr Abd W/wo Cm/mrcp  01/07/2015   CLINICAL DATA:  Biliary obstruction.  Jaundice  EXAM: MRI ABDOMEN WITHOUT AND WITH CONTRAST (INCLUDING MRCP)  TECHNIQUE: Multiplanar multisequence MR imaging of the abdomen was performed both before and after the administration of intravenous contrast. Heavily T2-weighted images of the biliary and pancreatic ducts were obtained, and three-dimensional MRCP images were rendered by post processing.  CONTRAST:  2mL MULTIHANCE GADOBENATE DIMEGLUMINE 529 MG/ML IV SOLN   COMPARISON:  05/06/2014  FINDINGS: Lower chest: Small bilateral pleural effusions are identified. New from previous study.  Hepatobiliary: No focal liver lesions identified. Marked intrahepatic bile duct dilatation. The extrahepatic common bile duct measured 1 cm. Abrupt cut off of the common bile duct is identified.  Pancreas: There is an ill defined mass arising from the head of pancreas which measures approximately 4.1 x 3.6 x 4.6 cm. The mass completely encases the portal venous confluence with significant diminished caliber of the portal vein. Branches of the celiac trunk appear encased by this mass. The SMA appears patent an unaffected.  Spleen: Normal appearance of the spleen.  Adrenals/Urinary Tract: The adrenal glands are unremarkable. Bilateral renal cysts noted.  Stomach/Bowel: The stomach and the upper abdominal bowel loops are unremarkable.  Vascular/Lymphatic: Status post repair of infrarenal abdominal aortic aneurysm. The aneurysm sac measures 4.6 cm, image 7 of series 13. Previously 5.5 cm. No retroperitoneal adenopathy identified.  Other: There is a small amount of ascites identified within the upper abdomen.  Musculoskeletal: No specific findings to suggest bone metastasis.  IMPRESSION: 1. Large mass arising from the head of pancreas is identified worrisome for primary pancreatic neoplasm. 2. There is complete obstruction of the common bile duct. The portal venous confluence is completely encased by the lesion. There is also involvement of a branches of the celiac trunk. 3. No specific findings identified to suggest liver metastasis.   Electronically Signed   By: Kerby Moors M.D.   On: 01/07/2015 17:53   Ir Int Lianne Cure Biliary Drain With Cholangiogram  01/10/2015   INDICATION: Obstructing pancreatic mass. Intra and extrahepatic biliary ductal dilatation. Remote history of tonsillar carcinoma with resultant stricturing, complicating attempts at endoscopy and intubation.  EXAM: ULTRASOUND AND  FLUOROSCOIC GUIDED PERCUTANEOUS TRANSHEPATIC CHOLANGIOGRAM  INTERNAL/EXTERNAL BILIARY DRAIN TUBE PLACEMENT  BILIARY BRUSH BIOPSY  COMPARISON:  MR 01/07/2015 AND EARLIER STUDIES  MEDICATIONS: cefazolin 2g preprocedural  CONTRAST:  51mL OMNIPAQUE IOHEXOL 300 MG/ML  SOLN  ANESTHESIA/SEDATION: Intravenous Fentanyl and Versed were administered as conscious sedation during continuous cardiorespiratory monitoring by the radiology RN, with a total moderate sedation time of 32 minutes.  FLUOROSCOPY TIME:  8 minutes 24 seconds, 419   mGy.  COMPLICATIONS: None immediate  TECHNIQUE: Informed written consent was obtained from [facility] [employer] after a discussion of the risks, benefits and alternatives to treatment. Questions regarding the procedure were encouraged and answered. A timeout was performed prior to the initiation of the procedure.  The right upper abdominal quadrant was prepped and draped in the usual sterile fashion, and a sterile drape was applied covering the operative field. Maximum barrier sterile technique with sterile gowns and gloves were used for the procedure. A timeout was performed prior to the initiation of the procedure.  Ultrasound scanning of the right upper abdominal quadrant was performed to delineate the anatomy and avoid transgression of the gallbladder or the pleural. An entry site in the right epigastric region was identified .  After the overlying soft tissues were anesthetized with 1% Lidocaine , a 21 gauge micropuncture needle was utilized to access the peripheral aspect of an anterior left hepatic duct. A 018 guidewire advanced centrally easily. A 3 French micro dilator was placed over the guidewire and contrast injection confirmed appropriate positioning in the biliary tree. The micro dilator was exchanged for a transitional dilator, exchanged for a Kumpe catheter over a Benson wire. With the use of a regular glide wire, the Kumpe catheter was advanced central to an apparent CBD structure, and  ultimately into the distal duodenum.  Over an Amplatz stiff wire, the tract was dilated and a 9 [place] long peel-away sheath was advanced. The dilator was removed and biliary brush biopsy of the CBD obstructive region performed x2, submitted to cytology. The peel-away was then exchanged for a 10.2 [place] biliary drainage catheter, advanced with coil ultimately locked within the duodenum. Contrast was injected and a completion radiograph was obtained. The catheter was connected to a drainage bag which yielded the brisk return of clear bile. The catheter was secured to the skin with an 0 Prolene suture and StatLock. The patient tolerated the procedure well without immediate postprocedural complication.  FINDINGS: The initial percutaneous transhepatic cholangiogram demonstrates marked intrahepatic biliary ductal dilatation. The proximal common duct just beyond the biliary confluence is patent. There is a long irregular stricture of the mid and distal CBD, with associated mucosal irregularity. The duodenum is decompressed.  Percutaneous biliary brush biopsy of the CBD lesion was performed x2.  10 French internal external biliary drain catheter was placed to the duodenum without complication.  IMPRESSION: 1. Percutaneous transhepatic cholangiogram demonstrating marked intrahepatic biliary ductal dilatation, long segment CBD irregularity and narrowing. 2. Technically successful brush biopsy of CBD lesion. 3. Technically successful internal external biliary drain catheter placement. The catheter was left to external drainage to maximize biliary decompression.   Electronically Signed   By: Lucrezia Europe M.D.   On: 01/10/2015 13:00   Ir Endoluminal Bx Of Biliary Tree  01/10/2015   INDICATION: Obstructing pancreatic mass. Intra and extrahepatic biliary ductal  dilatation. Remote history of tonsillar carcinoma with resultant stricturing, complicating attempts at endoscopy and intubation.  EXAM: ULTRASOUND AND FLUOROSCOIC GUIDED  PERCUTANEOUS TRANSHEPATIC CHOLANGIOGRAM  INTERNAL/EXTERNAL BILIARY DRAIN TUBE PLACEMENT  BILIARY BRUSH BIOPSY  COMPARISON:  MR 01/07/2015 AND EARLIER STUDIES  MEDICATIONS: cefazolin 2g preprocedural  CONTRAST:  64mL OMNIPAQUE IOHEXOL 300 MG/ML  SOLN  ANESTHESIA/SEDATION: Intravenous Fentanyl and Versed were administered as conscious sedation during continuous cardiorespiratory monitoring by the radiology RN, with a total moderate sedation time of 32 minutes.  FLUOROSCOPY TIME:  8 minutes 24 seconds, 419   mGy.  COMPLICATIONS: None immediate  TECHNIQUE: Informed written consent was obtained from [facility] after a discussion of the risks, benefits and alternatives to treatment. Questions regarding the procedure were encouraged and answered. A timeout was performed prior to the initiation of the procedure.  The right upper abdominal quadrant was prepped and draped in the usual sterile fashion, and a sterile drape was applied covering the operative field. Maximum barrier sterile technique with sterile gowns and gloves were used for the procedure. A timeout was performed prior to the initiation of the procedure.  Ultrasound scanning of the right upper abdominal quadrant was performed to delineate the anatomy and avoid transgression of the gallbladder or the pleural. An entry site in the right epigastric region was identified .  After the overlying soft tissues were anesthetized with 1% Lidocaine , a 21 gauge micropuncture needle was utilized to access the peripheral aspect of an anterior left hepatic duct. A 018 guidewire advanced centrally easily. A 3 French micro dilator was placed over the guidewire and contrast injection confirmed appropriate positioning in the biliary tree. The micro dilator was exchanged for a transitional dilator, exchanged for a Kumpe catheter over a Benson wire. With the use of a regular glide wire, the Kumpe catheter was advanced central to an apparent CBD structure, and ultimately into  the distal duodenum.  Over an Amplatz stiff wire, the tract was dilated and a 9 [place] long peel-away sheath was advanced. The dilator was removed and biliary brush biopsy of the CBD obstructive region performed x2, submitted to cytology. The peel-away was then exchanged for a 10.2 [place] biliary drainage catheter, advanced with coil ultimately locked within the duodenum. Contrast was injected and a completion radiograph was obtained. The catheter was connected to a drainage bag which yielded the brisk return of clear bile. The catheter was secured to the skin with an 0 Prolene suture and StatLock. The patient tolerated the procedure well without immediate postprocedural complication.  FINDINGS: The initial percutaneous transhepatic cholangiogram demonstrates marked intrahepatic biliary ductal dilatation. The proximal common duct just beyond the biliary confluence is patent. There is a long irregular stricture of the mid and distal CBD, with associated mucosal irregularity. The duodenum is decompressed.  Percutaneous biliary brush biopsy of the CBD lesion was performed x2.  10 French internal external biliary drain catheter was placed to the duodenum without complication.  IMPRESSION: 1. Percutaneous transhepatic cholangiogram demonstrating marked intrahepatic biliary ductal dilatation, long segment CBD irregularity and narrowing. 2. Technically successful brush biopsy of CBD lesion. 3. Technically successful internal external biliary drain catheter placement. The catheter was left to external drainage to maximize biliary decompression.   Electronically Signed   By: Lucrezia Europe M.D.   On: 01/10/2015 13:00   US Abdomen Limited Ruq  01/07/2015   CLINICAL DATA:  Elevated liver function studies, history of diabetes, alcohol dependence in remission, coronary artery disease.  EXAM: US ABDOMEN LIMITED -  RIGHT UPPER QUADRANT  COMPARISON:  Abdominal pelvic CT scan of December 20, 2013  FINDINGS: Gallbladder:  The  gallbladder is adequately distended. There is echogenic bile versus tiny floating stones within the gallbladder lumen. There is no gallbladder wall thickening, pericholecystic fluid, or positive sonographic Murphy's sign.  Common bile duct:  Diameter: 10 mm  Liver:  The intrahepatic ducts are dilated. No discrete hepatic mass is observed. There is a hypoechoic mass associated with the inferior aspect of the pancreatic head. There is ascites.  IMPRESSION: Suspicious masslike lesion in the region of the pancreatic head resulting in biliary ductal dilation. Possible tiny gallstones versus echogenic bile.  Abdominal MRI or CT scanning is recommended.   Electronically Signed   By: David  Martinique M.D.   On: 01/07/2015 14:03    Labs:  CBC:  Recent Labs  01/08/15 0428 01/09/15 2108 01/10/15 0713 01/11/15 0430  WBC 7.6 14.7* 13.4* 10.7*  HGB 8.9* 9.0* 8.3* 9.1*  HCT 26.3* 25.3* 23.7* 26.0*  PLT 427 449* 415* 409*    COAGS:  Recent Labs  03/13/14 0425 01/09/15 1417 01/09/15 2108  INR 1.1 1.08 1.19  APTT 27.7 25  --     BMP:  Recent Labs  01/10/15 1145 01/10/15 1622 01/10/15 2040 01/11/15 0430  NA 124* 124* 124* 127*  K 4.1 4.0 3.7 3.9  CL 95* 95* 92* 96*  CO2 21* 22 23 24   GLUCOSE 136* 147* 159* 129*  BUN 20 21* 21* 23*  CALCIUM 8.1* 8.1* 8.3* 8.3*  CREATININE 1.15 1.21 1.30* 1.41*  GFRNONAA 59* 55* 51* 46*  GFRAA >60 >60 59* 53*    LIVER FUNCTION TESTS:  Recent Labs  01/09/15 0156 01/09/15 2108 01/10/15 0713 01/11/15 0430  BILITOT 21.5* 20.9* 19.1* 14.3*  AST 214* 247* 261* 192*  ALT 147* 165* 170* 156*  ALKPHOS 916* 868* 759* 754*  PROT 6.2* 5.5* 5.1* 5.7*  ALBUMIN 2.2* 2.0* 1.8* 2.0*    Assessment and Plan: Generalized weakness Jaundice Malignant biliary obstruction, T. Bili 19.1, MR revealed large pancreatic mass S/p internal/external biliary drain with brush biopsy 10/1, T. Bili trending down, wbc trending down, afebrile, LFT's trending down, await  pathology results Continue to monitor daily output   Signed: Hedy Jacob 01/11/2015, 9:03 AM   I spent a total of 15 Minutes at the the patient's bedside AND on the patient's hospital floor or unit, greater than 50% of which was counseling/coordinating care for biliary obstruction

## 2015-01-11 NOTE — Progress Notes (Signed)
[facility] ROUNDING NOTE   Subjective:   Interval History:  Feels better  Objective:  Vital signs in last 24 hours:  Temp:  [97.7 F (36.5 C)-98.3 F (36.8 C)] 98.2 F (36.8 C) (10/02 0702) Pulse Rate:  [57-77] 77 (10/02 0702) Resp:  [10-100] 20 (10/02 0702) BP: (129-198)/(57-86) 136/69 mmHg (10/02 0702) SpO2:  [97 %-100 %] 98 % (10/02 0702) Weight:  [63.6 kg (140 lb 3.4 oz)] 63.6 kg (140 lb 3.4 oz) (10/02 0422)  Weight change: -1.9 kg (-4 lb 3 oz) Filed Weights   01/09/15 1945 01/10/15 0416 01/11/15 0422  Weight: 65.5 kg (144 lb 6.4 oz) 65.5 kg (144 lb 6.4 oz) 63.6 kg (140 lb 3.4 oz)    Intake/Output: I/O last 3 completed shifts: In: 1535.8 [P.O.:630; I.V.:855.8; IV Piggyback:50] Out: 2875 [Urine:2300; Drains:575]   Intake/Output this shift:  Total I/O In: 150 [I.V.:150] Out: 110 [Drains:110]  CVS- RRR RS- CTA ABD- BS present soft non-distended EXT- no edema   Basic Metabolic Panel:  Recent Labs Lab 01/10/15 0713 01/10/15 1145 01/10/15 1622 01/10/15 2040 01/11/15 0430  NA 124* 124* 124* 124* 127*  K 4.1 4.1 4.0 3.7 3.9  CL 96* 95* 95* 92* 96*  CO2 22 21* 22 23 24   GLUCOSE 119* 136* 147* 159* 129*  BUN 21* 20 21* 21* 23*  CREATININE 1.23 1.15 1.21 1.30* 1.41*  CALCIUM 8.2* 8.1* 8.1* 8.3* 8.3*    Liver Function Tests:  Recent Labs Lab 01/08/15 0428 01/09/15 0156 01/09/15 2108 01/10/15 0713 01/11/15 0430  AST 165* 214* 247* 261* 192*  ALT 130* 147* 165* 170* 156*  ALKPHOS 786* 916* 868* 759* 754*  BILITOT 18.5* 21.5* 20.9* 19.1* 14.3*  PROT 5.7* 6.2* 5.5* 5.1* 5.7*  ALBUMIN 2.1* 2.2* 2.0* 1.8* 2.0*    Recent Labs Lab 01/07/15 1149  LIPASE 739*   No results for input(s): AMMONIA in the last 168 hours.  CBC:  Recent Labs Lab 01/06/15 1033  01/07/15 1149 01/07/15 1953 01/08/15 0428 01/09/15 2108 01/10/15 0713 01/11/15 0430  WBC 9.2  < > 8.2 8.2 7.6 14.7* 13.4* 10.7*  NEUTROABS 7.0  --  6.9*  --   --  13.5*  12.1* 9.3*  HGB  --   < > 10.1* 9.8* 8.9* 9.0* 8.3* 9.1*  HCT 31.1*  < > 29.5* 27.6* 26.3* 25.3* 23.7* 26.0*  MCV  --   < > 82.7 83.0 83.3 78.6 81.4 82.5  PLT  --   < > 471* 450* 427 449* 415* 409*  < > = values in this interval not displayed.  Cardiac Enzymes: No results for input(s): CKTOTAL, CKMB, CKMBINDEX, TROPONINI in the last 168 hours.  BNP: Invalid input(s): POCBNP  CBG:  Recent Labs Lab 01/08/15 1121 01/08/15 2207 01/09/15 0721 01/09/15 1112 01/09/15 1610  GLUCAP 104* 240* 175* 178* 147*    Microbiology: Results for orders placed or performed in visit on 01/05/15  Urine Culture     Status: None   Collection Time: 01/05/15 12:00 AM  Result Value Ref Range Status   Urine Culture, Routine Final report  Final   Urine Culture result 1 No growth  Final    Coagulation Studies:  Recent Labs  01/09/15 1417 01/09/15 2108  LABPROT 14.2 15.3*  INR 1.08 1.19    Urinalysis: No results for input(s): COLORURINE, LABSPEC, PHURINE, GLUCOSEU, HGBUR, BILIRUBINUR, KETONESUR, PROTEINUR, UROBILINOGEN, NITRITE, LEUKOCYTESUR in the last 72 hours.  Invalid input(s): APPERANCEUR    Imaging: Ir Int Lianne Cure Biliary Drain  With Cholangiogram  01/10/2015   INDICATION: Obstructing pancreatic mass. Intra and extrahepatic biliary ductal dilatation. Remote history of tonsillar carcinoma with resultant stricturing, complicating attempts at endoscopy and intubation.  EXAM: ULTRASOUND AND FLUOROSCOIC GUIDED PERCUTANEOUS TRANSHEPATIC CHOLANGIOGRAM  INTERNAL/EXTERNAL BILIARY DRAIN TUBE PLACEMENT  BILIARY BRUSH BIOPSY  COMPARISON:  MR 01/07/2015 AND EARLIER STUDIES  MEDICATIONS: cefazolin 2g preprocedural  CONTRAST:  82mL OMNIPAQUE IOHEXOL 300 MG/ML  SOLN  ANESTHESIA/SEDATION: Intravenous Fentanyl and Versed were administered as conscious sedation during continuous cardiorespiratory monitoring by the radiology RN, with a total moderate sedation time of 32 minutes.  FLUOROSCOPY TIME:  8 minutes 24  seconds, 419   mGy.  COMPLICATIONS: None immediate  TECHNIQUE: Informed written consent was obtained from Evern Bio after a discussion of the risks, benefits and alternatives to treatment. Questions regarding the procedure were encouraged and answered. A timeout was performed prior to the initiation of the procedure.  The right upper abdominal quadrant was prepped and draped in the usual sterile fashion, and a sterile drape was applied covering the operative field. Maximum barrier sterile technique with sterile gowns and gloves were used for the procedure. A timeout was performed prior to the initiation of the procedure.  Ultrasound scanning of the right upper abdominal quadrant was performed to delineate the anatomy and avoid transgression of the gallbladder or the pleural. An entry site in the right epigastric region was identified .  After the overlying soft tissues were anesthetized with 1% Lidocaine , a 21 gauge micropuncture needle was utilized to access the peripheral aspect of an anterior left hepatic duct. A 018 guidewire advanced centrally easily. A 3 French micro dilator was placed over the guidewire and contrast injection confirmed appropriate positioning in the biliary tree. The micro dilator was exchanged for a transitional dilator, exchanged for a Kumpe catheter over a Benson wire. With the use of a regular glide wire, the Kumpe catheter was advanced central to an apparent CBD structure, and ultimately into the distal duodenum.  Over an Amplatz stiff wire, the tract was dilated and a 9 [place] long peel-away sheath was advanced. The dilator was removed and biliary brush biopsy of the CBD obstructive region performed x2, submitted to cytology. The peel-away was then exchanged for a 10.2 [place] biliary drainage catheter, advanced with coil ultimately locked within the duodenum. Contrast was injected and a completion radiograph was obtained. The catheter was connected to a drainage bag which yielded  the brisk return of clear bile. The catheter was secured to the skin with an 0 Prolene suture and StatLock. The patient tolerated the procedure well without immediate postprocedural complication.  FINDINGS: The initial percutaneous transhepatic cholangiogram demonstrates marked intrahepatic biliary ductal dilatation. The proximal common duct just beyond the biliary confluence is patent. There is a long irregular stricture of the mid and distal CBD, with associated mucosal irregularity. The duodenum is decompressed.  Percutaneous biliary brush biopsy of the CBD lesion was performed x2.  10 French internal external biliary drain catheter was placed to the duodenum without complication.  IMPRESSION: 1. Percutaneous transhepatic cholangiogram demonstrating marked intrahepatic biliary ductal dilatation, long segment CBD irregularity and narrowing. 2. Technically successful brush biopsy of CBD lesion. 3. Technically successful internal external biliary drain catheter placement. The catheter was left to external drainage to maximize biliary decompression.   Electronically Signed   By: Lucrezia Europe M.D.   On: 01/10/2015 13:00   Ir Endoluminal Bx Of Biliary Tree  01/10/2015   INDICATION: Obstructing pancreatic mass. Intra and  extrahepatic biliary ductal dilatation. Remote history of tonsillar carcinoma with resultant stricturing, complicating attempts at endoscopy and intubation.  EXAM: ULTRASOUND AND FLUOROSCOIC GUIDED PERCUTANEOUS TRANSHEPATIC CHOLANGIOGRAM  INTERNAL/EXTERNAL BILIARY DRAIN TUBE PLACEMENT  BILIARY BRUSH BIOPSY  COMPARISON:  MR 01/07/2015 AND EARLIER STUDIES  MEDICATIONS: cefazolin 2g preprocedural  CONTRAST:  40mL OMNIPAQUE IOHEXOL 300 MG/ML  SOLN  ANESTHESIA/SEDATION: Intravenous Fentanyl and Versed were administered as conscious sedation during continuous cardiorespiratory monitoring by the radiology RN, with a total moderate sedation time of 32 minutes.  FLUOROSCOPY TIME:  8 minutes 24 seconds, 419    mGy.  COMPLICATIONS: None immediate  TECHNIQUE: Informed written consent was obtained from Evern Bio after a discussion of the risks, benefits and alternatives to treatment. Questions regarding the procedure were encouraged and answered. A timeout was performed prior to the initiation of the procedure.  The right upper abdominal quadrant was prepped and draped in the usual sterile fashion, and a sterile drape was applied covering the operative field. Maximum barrier sterile technique with sterile gowns and gloves were used for the procedure. A timeout was performed prior to the initiation of the procedure.  Ultrasound scanning of the right upper abdominal quadrant was performed to delineate the anatomy and avoid transgression of the gallbladder or the pleural. An entry site in the right epigastric region was identified .  After the overlying soft tissues were anesthetized with 1% Lidocaine , a 21 gauge micropuncture needle was utilized to access the peripheral aspect of an anterior left hepatic duct. A 018 guidewire advanced centrally easily. A 3 French micro dilator was placed over the guidewire and contrast injection confirmed appropriate positioning in the biliary tree. The micro dilator was exchanged for a transitional dilator, exchanged for a Kumpe catheter over a Benson wire. With the use of a regular glide wire, the Kumpe catheter was advanced central to an apparent CBD structure, and ultimately into the distal duodenum.  Over an Amplatz stiff wire, the tract was dilated and a 9 [place] long peel-away sheath was advanced. The dilator was removed and biliary brush biopsy of the CBD obstructive region performed x2, submitted to cytology. The peel-away was then exchanged for a 10.2 [place] biliary drainage catheter, advanced with coil ultimately locked within the duodenum. Contrast was injected and a completion radiograph was obtained. The catheter was connected to a drainage bag which yielded the brisk  return of clear bile. The catheter was secured to the skin with an 0 Prolene suture and StatLock. The patient tolerated the procedure well without immediate postprocedural complication.  FINDINGS: The initial percutaneous transhepatic cholangiogram demonstrates marked intrahepatic biliary ductal dilatation. The proximal common duct just beyond the biliary confluence is patent. There is a long irregular stricture of the mid and distal CBD, with associated mucosal irregularity. The duodenum is decompressed.  Percutaneous biliary brush biopsy of the CBD lesion was performed x2.  10 French internal external biliary drain catheter was placed to the duodenum without complication.  IMPRESSION: 1. Percutaneous transhepatic cholangiogram demonstrating marked intrahepatic biliary ductal dilatation, long segment CBD irregularity and narrowing. 2. Technically successful brush biopsy of CBD lesion. 3. Technically successful internal external biliary drain catheter placement. The catheter was left to external drainage to maximize biliary decompression.   Electronically Signed   By: Lucrezia Europe M.D.   On: 01/10/2015 13:00     Medications:   . sodium chloride 75 mL/hr at 01/11/15 0800   . [START ON 01/12/2015] furosemide  40 mg Intravenous Q12H  . metoprolol  50 mg Oral BID  . pantoprazole  40 mg Oral Daily  . sodium chloride  3 mL Intravenous Q12H  . 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

## 2015-01-11 NOTE — Progress Notes (Signed)
Tranfer note:  Arrival Method: Wheelchair from [employer] Mental Orientation:A&OX4 Telemetry: Box [id number], CCMD notified Assessment: See flowsheet Skin: Dry  IV: Right chest porta cath, Right A/C NSL, Left A/C NSL Pain: Denies Tubes: RUQ biliary drain Safety Measures: Bed in lowest position, yellow socks place, call light and all other items within reach, bed alarm activated.  6700 Orientation: Patient has been oriented to the unit, staff and to the room.  Orders have been reviewed and implemented. Will continue to assess and monitor pt.   Gavin Potters

## 2015-01-12 LAB — COMPREHENSIVE METABOLIC PANEL
ALBUMIN: 1.9 g/dL — AB (ref 3.5–5.0)
ALK PHOS: 686 U/L — AB (ref 38–126)
ALT: 125 U/L — ABNORMAL HIGH (ref 17–63)
ANION GAP: 10 (ref 5–15)
AST: 134 U/L — AB (ref 15–41)
BILIRUBIN TOTAL: 16.6 mg/dL — AB (ref 0.3–1.2)
BUN: 22 mg/dL — AB (ref 6–20)
CALCIUM: 8.2 mg/dL — AB (ref 8.9–10.3)
CO2: 24 mmol/L (ref 22–32)
Chloride: 93 mmol/L — ABNORMAL LOW (ref 101–111)
Creatinine, Ser: 1.37 mg/dL — ABNORMAL HIGH (ref 0.61–1.24)
GFR calc Af Amer: 55 mL/min — ABNORMAL LOW (ref >60)
GFR, EST NON AFRICAN AMERICAN: 47 mL/min — AB (ref >60)
GLUCOSE: 120 mg/dL — AB (ref 65–99)
POTASSIUM: 3.4 mmol/L — AB (ref 3.5–5.1)
Sodium: 127 mmol/L — ABNORMAL LOW (ref 135–145)
TOTAL PROTEIN: 5.5 g/dL — AB (ref 6.5–8.1)

## 2015-01-12 LAB — RENAL FUNCTION PANEL
ALBUMIN: 1.8 g/dL — AB (ref 3.5–5.0)
Anion gap: 9 (ref 5–15)
BUN: 23 mg/dL — AB (ref 6–20)
CALCIUM: 8.1 mg/dL — AB (ref 8.9–10.3)
CO2: 24 mmol/L (ref 22–32)
Chloride: 94 mmol/L — ABNORMAL LOW (ref 101–111)
Creatinine, Ser: 1.46 mg/dL — ABNORMAL HIGH (ref 0.61–1.24)
GFR calc Af Amer: 51 mL/min — ABNORMAL LOW (ref >60)
GFR, EST NON AFRICAN AMERICAN: 44 mL/min — AB (ref >60)
GLUCOSE: 147 mg/dL — AB (ref 65–99)
Phosphorus: 3.7 mg/dL (ref 2.5–4.6)
Potassium: 3.4 mmol/L — ABNORMAL LOW (ref 3.5–5.1)
SODIUM: 127 mmol/L — AB (ref 135–145)

## 2015-01-12 LAB — BASIC METABOLIC PANEL
ANION GAP: 10 (ref 5–15)
BUN: 24 mg/dL — ABNORMAL HIGH (ref 6–20)
CALCIUM: 8.1 mg/dL — AB (ref 8.9–10.3)
CO2: 23 mmol/L (ref 22–32)
CREATININE: 1.33 mg/dL — AB (ref 0.61–1.24)
Chloride: 96 mmol/L — ABNORMAL LOW (ref 101–111)
GFR, EST AFRICAN AMERICAN: 57 mL/min — AB (ref >60)
GFR, EST NON AFRICAN AMERICAN: 49 mL/min — AB (ref >60)
Glucose, Bld: 162 mg/dL — ABNORMAL HIGH (ref 65–99)
Potassium: 3.3 mmol/L — ABNORMAL LOW (ref 3.5–5.1)
SODIUM: 129 mmol/L — AB (ref 135–145)

## 2015-01-12 LAB — CBC
HEMATOCRIT: 27 % — AB (ref 39.0–52.0)
Hemoglobin: 8.9 g/dL — ABNORMAL LOW (ref 13.0–17.0)
MCH: 27.6 pg (ref 26.0–34.0)
MCHC: 33 g/dL (ref 30.0–36.0)
MCV: 83.6 fL (ref 78.0–100.0)
Platelets: 426 10*3/uL — ABNORMAL HIGH (ref 150–400)
RBC: 3.23 MIL/uL — ABNORMAL LOW (ref 4.22–5.81)
RDW: 17.7 % — AB (ref 11.5–15.5)
WBC: 10.9 10*3/uL — AB (ref 4.0–10.5)

## 2015-01-12 MED ORDER — FUROSEMIDE 40 MG PO TABS
40.0000 mg | ORAL_TABLET | Freq: Two times a day (BID) | ORAL | Status: DC
  Administered 2015-01-12 – 2015-01-13 (×2): 40 mg via ORAL
  Filled 2015-01-12 (×2): qty 1

## 2015-01-12 MED ORDER — POTASSIUM CHLORIDE CRYS ER 20 MEQ PO TBCR
40.0000 meq | EXTENDED_RELEASE_TABLET | Freq: Once | ORAL | Status: AC
  Administered 2015-01-12: 40 meq via ORAL
  Filled 2015-01-12: qty 2

## 2015-01-12 NOTE — Progress Notes (Signed)
Referring Physician(s): [facility]/GI  Chief Complaint: Malignant Biliary obstruction s/p internal/external biliary drain and brush biopsy 10/1  Subjective: Feeling much better today. Has been ambulating and tolerating diet.   Allergies: Review of patient's allergies indicates no known allergies.  Medications:  Current facility-administered medications:  .  furosemide (LASIX) tablet 40 mg, 40 mg, Oral, BID, Mauricia Area, MD .  heparin injection 5,000 Units, 5,000 Units, Subcutaneous, 3 times per day, Caren Griffins, MD, 5,000 Units at 01/12/15 0531 .  HYDROcodone-acetaminophen (NORCO/VICODIN) 5-325 MG per tablet 1-2 tablet, 1-2 tablet, Oral, Q4H PRN, Arne Cleveland, MD, 1 tablet at 01/11/15 2228 .  iohexol (OMNIPAQUE) 300 MG/ML solution 50 mL, 50 mL, Other, Once PRN, Medication Radiologist, MD, 20 mL at 01/10/15 1206 .  metoprolol (LOPRESSOR) tablet 50 mg, 50 mg, Oral, BID, Lavina Hamman, MD, 50 mg at 01/12/15 1001 .  ondansetron (ZOFRAN) tablet 4 mg, 4 mg, Oral, Q6H PRN **OR** ondansetron (ZOFRAN) injection 4 mg, 4 mg, Intravenous, Q6H PRN, Lavina Hamman, MD .  pantoprazole (PROTONIX) EC tablet 40 mg, 40 mg, Oral, Daily, Lavina Hamman, MD, 40 mg at 01/12/15 1002 .  potassium chloride SA (K-DUR,KLOR-CON) CR tablet 40 mEq, 40 mEq, Oral, Once, Delfina Redwood, MD .  potassium chloride SA (K-DUR,KLOR-CON) CR tablet 40 mEq, 40 mEq, Oral, Once, Mauricia Area, MD .  sodium chloride 0.9 % injection 10-40 mL, 10-40 mL, Intracatheter, PRN, Caren Griffins, MD, 20 mL at 01/11/15 2202 .  sodium chloride 0.9 % injection 3 mL, 3 mL, Intravenous, Q12H, Lavina Hamman, MD, 3 mL at 01/11/15 2230 .  sodium chloride 0.9 % injection 5 mL, 5 mL, Intracatheter, Q8H, Arne Cleveland, MD, 5 mL at 01/12/15 1159  Facility-Administered Medications Ordered in Other Encounters:  .  sodium chloride 0.9 % injection 10 mL, 10 mL, Intravenous, PRN, Evlyn Kanner, NP, 10 mL at 12/05/14 1103  Vital  Signs: BP 121/59 mmHg  Pulse 87  Temp(Src) 98.1 F (36.7 C) (Oral)  Resp 18  Ht 5\' 7"  (1.702 m)  Wt 139 lb 8 oz (63.277 kg)  BMI 21.84 kg/m2  SpO2 99%  Physical Exam General: A&Ox3, NAD, eating Abd: Soft,  RUQ biliary drain intact-NT,  bilious output in bag  Imaging:  Ir Endoluminal Bx Of Biliary Tree  01/10/2015   INDICATION: Obstructing pancreatic mass. Intra and extrahepatic biliary ductal dilatation. Remote history of tonsillar carcinoma with resultant stricturing, complicating attempts at endoscopy and intubation.  EXAM: ULTRASOUND AND FLUOROSCOIC GUIDED PERCUTANEOUS TRANSHEPATIC CHOLANGIOGRAM  INTERNAL/EXTERNAL BILIARY DRAIN TUBE PLACEMENT  BILIARY BRUSH BIOPSY  COMPARISON:  MR 01/07/2015 AND EARLIER STUDIES  MEDICATIONS: cefazolin 2g preprocedural  CONTRAST:  58mL OMNIPAQUE IOHEXOL 300 MG/ML  SOLN  ANESTHESIA/SEDATION: Intravenous Fentanyl and Versed were administered as conscious sedation during continuous cardiorespiratory monitoring by the radiology RN, with a total moderate sedation time of 32 minutes.  FLUOROSCOPY TIME:  8 minutes 24 seconds, 419   mGy.  COMPLICATIONS: None immediate  TECHNIQUE: Informed written consent was obtained from Geoffrey Gregory after a discussion of the risks, benefits and alternatives to treatment. Questions regarding the procedure were encouraged and answered. A timeout was performed prior to the initiation of the procedure.  The right upper abdominal quadrant was prepped and draped in the usual sterile fashion, and a sterile drape was applied covering the operative field. Maximum barrier sterile technique with sterile gowns and gloves were used for the procedure. A timeout was performed prior to the initiation of the  procedure.  Ultrasound scanning of the right upper abdominal quadrant was performed to delineate the anatomy and avoid transgression of the gallbladder or the pleural. An entry site in the right epigastric region was identified .  After the  overlying soft tissues were anesthetized with 1% Lidocaine , a 21 gauge micropuncture needle was utilized to access the peripheral aspect of an anterior left hepatic duct. A 018 guidewire advanced centrally easily. A 3 French micro dilator was placed over the guidewire and contrast injection confirmed appropriate positioning in the biliary tree. The micro dilator was exchanged for a transitional dilator, exchanged for a Kumpe catheter over a Benson wire. With the use of a regular glide wire, the Kumpe catheter was advanced central to an apparent CBD structure, and ultimately into the distal duodenum.  Over an Amplatz stiff wire, the tract was dilated and a 9 [place] long peel-away sheath was advanced. The dilator was removed and biliary brush biopsy of the CBD obstructive region performed x2, submitted to cytology. The peel-away was then exchanged for a 10.2 [place] biliary drainage catheter, advanced with coil ultimately locked within the duodenum. Contrast was injected and a completion radiograph was obtained. The catheter was connected to a drainage bag which yielded the brisk return of clear bile. The catheter was secured to the skin with an 0 Prolene suture and StatLock. The patient tolerated the procedure well without immediate postprocedural complication.  FINDINGS: The initial percutaneous transhepatic cholangiogram demonstrates marked intrahepatic biliary ductal dilatation. The proximal common duct just beyond the biliary confluence is patent. There is a long irregular stricture of the mid and distal CBD, with associated mucosal irregularity. The duodenum is decompressed.  Percutaneous biliary brush biopsy of the CBD lesion was performed x2.  10 French internal external biliary drain catheter was placed to the duodenum without complication.  IMPRESSION: 1. Percutaneous transhepatic cholangiogram demonstrating marked intrahepatic biliary ductal dilatation, long segment CBD irregularity and narrowing. 2.  Technically successful brush biopsy of CBD lesion. 3. Technically successful internal external biliary drain catheter placement. The catheter was left to external drainage to maximize biliary decompression.   Electronically Signed   By: Lucrezia Europe M.D.   On: 01/10/2015 13:00    Labs:  CBC:  Recent Labs  01/09/15 2108 01/10/15 0713 01/11/15 0430 01/12/15 0347  WBC 14.7* 13.4* 10.7* 10.9*  HGB 9.0* 8.3* 9.1* 8.9*  HCT 25.3* 23.7* 26.0* 27.0*  PLT 449* 415* 409* 426*    COAGS:  Recent Labs  03/13/14 0425 01/09/15 1417 01/09/15 2108  INR 1.1 1.08 1.19  APTT 27.7 25  --     BMP:  Recent Labs  01/11/15 0430 01/11/15 2200 01/12/15 0347 01/12/15 1050  NA 127* 124* 127* 129*  K 3.9 3.4* 3.4* 3.3*  CL 96* 90* 93* 96*  CO2 24 24 24 23   GLUCOSE 129* 133* 120* 162*  BUN 23* 23* 22* 24*  CALCIUM 8.3* 7.9* 8.2* 8.1*  CREATININE 1.41* 1.36* 1.37* 1.33*  GFRNONAA 46* 48* 47* 49*  GFRAA 53* 55* 55* 57*    LIVER FUNCTION TESTS:  Recent Labs  01/09/15 2108 01/10/15 0713 01/11/15 0430 01/12/15 0347  BILITOT 20.9* 19.1* 14.3* 16.6*  AST 247* 261* 192* 134*  ALT 165* 170* 156* 125*  ALKPHOS 868* 759* 754* 686*  PROT 5.5* 5.1* 5.7* 5.5*  ALBUMIN 2.0* 1.8* 2.0* 1.9*    Assessment and Plan: Jaundice Malignant biliary obstruction S/p internal/external biliary drain with brush biopsy 10/1, T. Bili trending down, wbc trending down, afebrile,  LFT's trending down, await pathology results Continue to monitor daily output   Signed: Ascencion Dike 01/12/2015, 1:32 PM   I spent a total of 15 Minutes at the the patient's bedside AND on the patient's hospital floor or unit, greater than 50% of which was counseling/coordinating care for biliary obstruction

## 2015-01-12 NOTE — Progress Notes (Addendum)
[facility] PROGRESS NOTE  Geoffrey Gregory YCX:[id number] DOB: 10/18/1932 DOA: 01/09/2015 PCP: [name], MD  Summary 81 yo bm transferred from [facility] for biliary drain placement by IR and further workup of pancreatic mass. Was admitted there with sodium of 111 felt to be due to SIADH, jaundice and pancreatic mass. 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 Transferring physician reportedly spoke with interventional radiology and gastroenterology, although it's unclear which gastroenterologist was contacted. Has been on hypertonic saline and hydrochlorothiazide stopped.  Assessment/Plan:  Principal Problem:   Malignant obstructive jaundice: Status post external and internal biliary drain. Will asked nursing staff to teach drain care.  Active Problems:   Pancreatic mass:  Likely unresectable pancreatic cancer per oncology's note. CA-19-9 elevated at 418. Brushings/cytology pending. May follow-up with oncologist in [place] as an outpatient.    SIADH (syndrome of inappropriate ADH production): Sodium up to 129 today. Discussed with Dr. Liana Crocker dating. Discontinue IV fluids and change Lasix to by mouth. At discharge he recommends fluid restriction and Lasix 40 mg a day. Will need close follow-up. Cortisol 17.     Acid reflux: On PPI    Essential (primary) hypertension: Continue metoprolol. Would not resume thiazide.    Hypercholesteremia    Cancer of tonsil: Status post chemoradiation, in remission per oncology note. Reportedly not able to be removed due to extension beyond tonsillar fossa.    Anemia: No gross bleeding. anemia panel with minimally low iron at 41, but normal ferritin.     Euthyroid sick syndrome: Would repeat thyroid function tests in a few months.  Hypokalemia: Replete by mouth.  Code Status:  full Family Communication:  Wife at  bedside Disposition Plan:  Home tomorrow if stable   Consultants:  Interventional radiology   nephrology  Procedures:     Antibiotics:    HPI/Subjective: Feels "great!". Ambulating fine. Eating fine. Feels less bloated.   Objective: Filed Vitals:   01/12/15 0915  BP: 121/59  Pulse: 87  Temp: 98.1 F (36.7 C)  Resp: 18    Intake/Output Summary (Last 24 hours) at 01/12/15 1227 Last data filed at 01/12/15 1058  Gross per 24 hour  Intake 1508.75 ml  Output    795 ml  Net 713.75 ml   Filed Weights   01/10/15 0416 01/11/15 0422 01/12/15 0535  Weight: 65.5 kg (144 lb 6.4 oz) 63.6 kg (140 lb 3.4 oz) 63.277 kg (139 lb 8 oz)    Exam:   General:  Alert, elderly in no acute distress. Oriented.  HEENT: Scleral icterus noted  Cardiovascular: Regular rate rhythm without murmurs gallops rubs  Respiratory: Clear to auscultation bilaterally without wheezes rhonchi or rales  Abdomen: Soft, nontender, nondistended.right upper quadrant drain draining bilious fluid   Ext: No clubbing cyanosis or edema   Basic Metabolic Panel:  Recent Labs Lab 01/10/15 2040 01/11/15 0430 01/11/15 2200 01/12/15 0347 01/12/15 1050  NA 124* 127* 124* 127* 129*  K 3.7 3.9 3.4* 3.4* 3.3*  CL 92* 96* 90* 93* 96*  CO2 23 24 24 24 23   GLUCOSE 159* 129* 133* 120* 162*  BUN 21* 23* 23* 22* 24*  CREATININE 1.30* 1.41* 1.36* 1.37* 1.33*  CALCIUM 8.3* 8.3* 7.9* 8.2* 8.1*   Liver Function Tests:  Recent Labs Lab 01/09/15 0156 01/09/15 2108 01/10/15 0713 01/11/15 0430 01/12/15 0347  AST 214* 247* 261* 192* 134*  ALT 147* 165*  170* 156* 125*  ALKPHOS 916* 868* 759* 754* 686*  BILITOT 21.5* 20.9* 19.1* 14.3* 16.6*  PROT 6.2* 5.5* 5.1* 5.7* 5.5*  ALBUMIN 2.2* 2.0* 1.8* 2.0* 1.9*    Recent Labs Lab 01/07/15 1149  LIPASE 739*   No results for input(s): AMMONIA in the last 168 hours. CBC:  Recent Labs Lab 01/06/15 1033 01/07/15 1149  01/08/15 0428 01/09/15 2108  01/10/15 0713 01/11/15 0430 01/12/15 0347  WBC 9.2 8.2  < > 7.6 14.7* 13.4* 10.7* 10.9*  NEUTROABS 7.0 6.9*  --   --  13.5* 12.1* 9.3*  --   HGB  --  10.1*  < > 8.9* 9.0* 8.3* 9.1* 8.9*  HCT 31.1* 29.5*  < > 26.3* 25.3* 23.7* 26.0* 27.0*  MCV  --  82.7  < > 83.3 78.6 81.4 82.5 83.6  PLT  --  471*  < > 427 449* 415* 409* 426*  < > = values in this interval not displayed. Cardiac Enzymes: No results for input(s): CKTOTAL, CKMB, CKMBINDEX, TROPONINI in the last 168 hours. BNP (last 3 results) No results for input(s): BNP in the last 8760 hours.  ProBNP (last 3 results) No results for input(s): PROBNP in the last 8760 hours.  CBG:  Recent Labs Lab 01/08/15 1121 01/08/15 2207 01/09/15 0721 01/09/15 1112 01/09/15 1610  GLUCAP 104* 240* 175* 178* 147*    Recent Results (from the past 240 hour(s))  Urine Culture     Status: None   Collection Time: 01/05/15 12:00 AM  Result Value Ref Range Status   Urine Culture, Routine Final report  Final   Urine Culture result 1 No growth  Final     Studies: No results found.  Scheduled Meds: . furosemide  40 mg Oral BID  . heparin subcutaneous  5,000 Units Subcutaneous 3 times per day  . metoprolol  50 mg Oral BID  . pantoprazole  40 mg Oral Daily  . potassium chloride  40 mEq Oral Once  . potassium chloride  40 mEq Oral Once  . sodium chloride  3 mL Intravenous Q12H  . sodium chloride  5 mL Intracatheter Q8H   Continuous Infusions:    Time spent: 25 minutes  [facility] [email or web address], password [facility] 01/12/2015, 12:27 PM  LOS: 3 days

## 2015-01-12 NOTE — Progress Notes (Signed)
Subjective: Interval History: has no complaint , feels much better..  Objective: Vital signs in last 24 hours: Temp:  [98 F (36.7 C)-98.8 F (37.1 C)] 98.1 F (36.7 C) (10/03 0915) Pulse Rate:  [72-87] 87 (10/03 0915) Resp:  [16-18] 18 (10/03 0915) BP: (121-145)/(59-87) 121/59 mmHg (10/03 0915) SpO2:  [97 %-100 %] 99 % (10/03 0915) Weight:  [63.277 kg (139 lb 8 oz)] 63.277 kg (139 lb 8 oz) (10/03 0535) Weight change: -0.323 kg (-11.4 oz)  Intake/Output from previous day: 10/02 0701 - 10/03 0700 In: 1263.8 [P.O.:240; I.V.:1023.8] Out: 1460 [Urine:600; Drains:860] Intake/Output this shift: Total I/O In: 700 [P.O.:700] Out: 220 [Drains:220]  General appearance: alert and cooperative Resp: clear to auscultation bilaterally Cardio: S1, S2 normal and systolic murmur: systolic ejection 2/6, decrescendo at 2nd left intercostal space GI: pos bs, liver down 4 cm, drain RUQ Extremities: extremities normal, atraumatic, no cyanosis or edema  Lab Results:  Recent Labs  01/11/15 0430 01/12/15 0347  WBC 10.7* 10.9*  HGB 9.1* 8.9*  HCT 26.0* 27.0*  PLT 409* 426*   BMET:  Recent Labs  01/12/15 0347 01/12/15 1050  NA 127* 129*  K 3.4* 3.3*  CL 93* 96*  CO2 24 23  GLUCOSE 120* 162*  BUN 22* 24*  CREATININE 1.37* 1.33*  CALCIUM 8.2* 8.1*   No results for input(s): PTH in the last 72 hours. Iron Studies:  Recent Labs  01/11/15 0430  IRON 41*  TIBC 266  FERRITIN 708*    Studies/Results: No results found.  I have reviewed the patient's current medications.  Assessment/Plan: 1 Low SNa, improving cont Lasix/fluid restrict 2 Pancreatic mass has drain bx pending 3 Tolxilar ca 4 DM 5 Hypothyroid P stop ivf, give po Lasix, K     LOS: 3 days   Ariatna Jester L 01/12/2015,12:23 PM

## 2015-01-12 NOTE — Care Management Important Message (Signed)
Important Message  Patient Details  Name: Geoffrey Gregory MRN: [id number] Date of Birth: 1932/10/28   Medicare Important Message Given:  Yes-second notification given    Delorse Lek 01/12/2015, 1:31 PM

## 2015-01-13 ENCOUNTER — Inpatient Hospital Stay: Payer: Medicare HMO | Attending: Oncology | Admitting: Oncology

## 2015-01-13 DIAGNOSIS — I10 Essential (primary) hypertension: Secondary | ICD-10-CM | POA: Insufficient documentation

## 2015-01-13 DIAGNOSIS — R17 Unspecified jaundice: Secondary | ICD-10-CM | POA: Insufficient documentation

## 2015-01-13 DIAGNOSIS — Z87891 Personal history of nicotine dependence: Secondary | ICD-10-CM | POA: Insufficient documentation

## 2015-01-13 DIAGNOSIS — Z85818 Personal history of malignant neoplasm of other sites of lip, oral cavity, and pharynx: Secondary | ICD-10-CM | POA: Insufficient documentation

## 2015-01-13 DIAGNOSIS — E559 Vitamin D deficiency, unspecified: Secondary | ICD-10-CM | POA: Insufficient documentation

## 2015-01-13 DIAGNOSIS — Z79899 Other long term (current) drug therapy: Secondary | ICD-10-CM | POA: Insufficient documentation

## 2015-01-13 DIAGNOSIS — C250 Malignant neoplasm of head of pancreas: Secondary | ICD-10-CM | POA: Insufficient documentation

## 2015-01-13 DIAGNOSIS — Z7982 Long term (current) use of aspirin: Secondary | ICD-10-CM | POA: Insufficient documentation

## 2015-01-13 DIAGNOSIS — E119 Type 2 diabetes mellitus without complications: Secondary | ICD-10-CM | POA: Insufficient documentation

## 2015-01-13 DIAGNOSIS — K831 Obstruction of bile duct: Secondary | ICD-10-CM | POA: Insufficient documentation

## 2015-01-13 DIAGNOSIS — I714 Abdominal aortic aneurysm, without rupture: Secondary | ICD-10-CM | POA: Insufficient documentation

## 2015-01-13 DIAGNOSIS — E871 Hypo-osmolality and hyponatremia: Secondary | ICD-10-CM | POA: Insufficient documentation

## 2015-01-13 DIAGNOSIS — I2510 Atherosclerotic heart disease of native coronary artery without angina pectoris: Secondary | ICD-10-CM | POA: Insufficient documentation

## 2015-01-13 DIAGNOSIS — E785 Hyperlipidemia, unspecified: Secondary | ICD-10-CM | POA: Insufficient documentation

## 2015-01-13 LAB — COMPREHENSIVE METABOLIC PANEL
ALK PHOS: 543 U/L — AB (ref 38–126)
ALT: 110 U/L — AB (ref 17–63)
AST: 121 U/L — ABNORMAL HIGH (ref 15–41)
Albumin: 1.9 g/dL — ABNORMAL LOW (ref 3.5–5.0)
Anion gap: 8 (ref 5–15)
BILIRUBIN TOTAL: 20.5 mg/dL — AB (ref 0.3–1.2)
BUN: 26 mg/dL — ABNORMAL HIGH (ref 6–20)
CALCIUM: 8.2 mg/dL — AB (ref 8.9–10.3)
CO2: 25 mmol/L (ref 22–32)
CREATININE: 1.61 mg/dL — AB (ref 0.61–1.24)
Chloride: 98 mmol/L — ABNORMAL LOW (ref 101–111)
GFR calc non Af Amer: 39 mL/min — ABNORMAL LOW (ref >60)
GFR, EST AFRICAN AMERICAN: 45 mL/min — AB (ref >60)
GLUCOSE: 146 mg/dL — AB (ref 65–99)
Potassium: 4.1 mmol/L (ref 3.5–5.1)
SODIUM: 131 mmol/L — AB (ref 135–145)
Total Protein: 5.3 g/dL — ABNORMAL LOW (ref 6.5–8.1)

## 2015-01-13 MED ORDER — DIPHENHYDRAMINE HCL 25 MG PO CAPS: 25.0000 mg | ORAL_CAPSULE | ORAL | Status: DC | PRN

## 2015-01-13 MED ORDER — METOPROLOL TARTRATE 25 MG PO TABS
25.0000 mg | ORAL_TABLET | Freq: Two times a day (BID) | ORAL | Status: DC
  Administered 2015-01-13 – 2015-01-22 (×18): 25 mg via ORAL
  Filled 2015-01-13 (×17): qty 1

## 2015-01-13 MED ORDER — PIPERACILLIN-TAZOBACTAM 3.375 G IVPB
3.3750 g | Freq: Once | INTRAVENOUS | Status: AC
  Administered 2015-01-14: 3.375 g via INTRAVENOUS
  Filled 2015-01-13: qty 50

## 2015-01-13 MED ORDER — FUROSEMIDE 40 MG PO TABS: 40.0000 mg | ORAL_TABLET | Freq: Every day | ORAL | Status: DC

## 2015-01-13 MED ORDER — FUROSEMIDE 20 MG PO TABS
20.0000 mg | ORAL_TABLET | Freq: Every day | ORAL | Status: DC
  Administered 2015-01-14: 20 mg via ORAL
  Filled 2015-01-13: qty 1

## 2015-01-13 MED ORDER — SALINE SPRAY 0.65 % NA SOLN
1.0000 | NASAL | Status: DC | PRN
  Administered 2015-01-13: 1 via NASAL
  Filled 2015-01-13: qty 44

## 2015-01-13 MED ORDER — SIMETHICONE 80 MG PO CHEW
160.0000 mg | CHEWABLE_TABLET | Freq: Four times a day (QID) | ORAL | Status: DC | PRN
  Administered 2015-01-13 – 2015-01-20 (×8): 160 mg via ORAL
  Filled 2015-01-13 (×9): qty 2

## 2015-01-13 NOTE — Progress Notes (Signed)
Subjective: Interval History: has complaints had chill last pm.  Objective: Vital signs in last 24 hours: Temp:  [98 F (36.7 C)-98.6 F (37 C)] 98 F (36.7 C) (10/04 0950) Pulse Rate:  [77-84] 77 (10/04 0950) Resp:  [18] 18 (10/04 0950) BP: (105-118)/(58-62) 118/62 mmHg (10/04 0950) SpO2:  [97 %-98 %] 98 % (10/04 0950) Weight:  [63.3 kg (139 lb 8.8 oz)] 63.3 kg (139 lb 8.8 oz) (10/03 2035) Weight change: 0.023 kg (0.8 oz)  Intake/Output from previous day: 10/03 0701 - 10/04 0700 In: 1885 [P.O.:1660; I.V.:225] Out: 1045 [Urine:550; Drains:495] Intake/Output this shift: Total I/O In: 240 [P.O.:240] Out: 450 [Urine:450]  General appearance: alert, cooperative and no distress Resp: clear to auscultation bilaterally Cardio: S1, S2 normal and systolic murmur: systolic ejection 2/6, decrescendo at 2nd left intercostal space GI: drain RUQ, pos bs, soft Extremities: extremities normal, atraumatic, no cyanosis or edema  Lab Results:  Recent Labs  01/11/15 0430 01/12/15 0347  WBC 10.7* 10.9*  HGB 9.1* 8.9*  HCT 26.0* 27.0*  PLT 409* 426*   BMET:  Recent Labs  01/12/15 1440 01/13/15 0431  NA 127* 131*  K 3.4* 4.1  CL 94* 98*  CO2 24 25  GLUCOSE 147* 146*  BUN 23* 26*  CREATININE 1.46* 1.61*  CALCIUM 8.1* 8.2*   No results for input(s): PTH in the last 72 hours. Iron Studies:  Recent Labs  01/11/15 0430  IRON 41*  TIBC 266  FERRITIN 708*    Studies/Results: No results found.  I have reviewed the patient's current medications.  Assessment/Plan: 1 Hyponatremia improving . Cr ^, Lasix cut yest, would check again in 1 wk and may be able to cut to 20mg  or even d/c 2 Biliary obstruct with drain 3 tonsillar Ca 4PVD P F/U with primary MD, will s/o for now.    LOS: 4 days   Samuele Storey L 01/13/2015,10:10 AM

## 2015-01-13 NOTE — Progress Notes (Signed)
Patient ID: Geoffrey Gregory, male   DOB: [birth year], 81 y.o.   MRN: [id number]    Referring Physician(s): Wohl/[facility]  Chief Complaint: Pancreatic cancer   Subjective:  Pt doing well; denies sig abd pain,N/V  Allergies: Review of patient's allergies indicates no known allergies.  Medications: Prior to Admission medications   Medication Sig Start Date End Date Taking? Authorizing Provider  acetaminophen (TYLENOL) 500 MG tablet Take 500 mg by mouth every 6 (six) hours as needed.   Yes Historical Provider, MD  aspirin 81 MG tablet Take by mouth daily. 06/08/11  Yes Historical Provider, MD  cholecalciferol (VITAMIN D) 1000 UNITS tablet Take 1,000 Units by mouth daily.   Yes Historical Provider, MD  lovastatin (MEVACOR) 40 MG tablet Take 40 mg by mouth at bedtime.   Yes Historical Provider, MD  metoprolol (LOPRESSOR) 50 MG tablet Take 1 tablet (50 mg total) by mouth 2 (two) times daily. 10/17/14  Yes [name], MD  omeprazole (PRILOSEC) 40 MG capsule Take 40 mg by mouth daily.  12/11/13  Yes Historical Provider, MD     Vital Signs: BP 118/62 mmHg  Pulse 77  Temp(Src) 98 F (36.7 C) (Oral)  Resp 18  Ht _0  (1.702 m)  Wt 139 lb 8.8 oz (63.3 kg)  BMI 21.85 kg/m2  SpO2 98%  Physical Exam patient awake alert, jaundiced, chest -clear to auscultation bilaterally. Heart with regular rate and rhythm. Abdomen soft, positive bowel sounds, nontender, intact biliary drain with 495 mL output today; extremities with full range of motion and no edema.  Imaging: Ir Int Lianne Cure Biliary Drain With Cholangiogram  01/10/2015   INDICATION: Obstructing pancreatic mass. Intra and extrahepatic biliary ductal dilatation. Remote history of tonsillar carcinoma with resultant stricturing, complicating attempts at endoscopy and intubation.  EXAM: ULTRASOUND AND FLUOROSCOIC GUIDED PERCUTANEOUS TRANSHEPATIC CHOLANGIOGRAM  INTERNAL/EXTERNAL BILIARY DRAIN TUBE PLACEMENT  BILIARY BRUSH BIOPSY  COMPARISON:  MR  01/07/2015 AND EARLIER STUDIES  MEDICATIONS: cefazolin 2g preprocedural  CONTRAST:  45m OMNIPAQUE IOHEXOL 300 MG/ML  SOLN  ANESTHESIA/SEDATION: Intravenous Fentanyl and Versed were administered as conscious sedation during continuous cardiorespiratory monitoring by the radiology RN, with a total moderate sedation time of 32 minutes.  FLUOROSCOPY TIME:  8 minutes 24 seconds, 419   mGy.  COMPLICATIONS: None immediate  TECHNIQUE: Informed written consent was obtained from [employer] a discussion of the risks, benefits and alternatives to treatment. Questions regarding the procedure were encouraged and answered. A timeout was performed prior to the initiation of the procedure.  The right upper abdominal quadrant was prepped and draped in the usual sterile fashion, and a sterile drape was applied covering the operative field. Maximum barrier sterile technique with sterile gowns and gloves were used for the procedure. A timeout was performed prior to the initiation of the procedure.  Ultrasound scanning of the right upper abdominal quadrant was performed to delineate the anatomy and avoid transgression of the gallbladder or the pleural. An entry site in the right epigastric region was identified .  After the overlying soft tissues were anesthetized with 1% Lidocaine , a 21 gauge micropuncture needle was utilized to access the peripheral aspect of an anterior left hepatic duct. A 018 guidewire advanced centrally easily. A 3 French micro dilator was placed over the guidewire and contrast injection confirmed appropriate positioning in the biliary tree. The micro dilator was exchanged for a transitional dilator, exchanged for a Kumpe catheter over a Benson wire. With the use of a regular  glide wire, the Kumpe catheter was advanced central to an apparent CBD structure, and ultimately into the distal duodenum.  Over an Amplatz stiff wire, the tract was dilated and a 9 [place] long peel-away sheath was advanced. The  dilator was removed and biliary brush biopsy of the CBD obstructive region performed x2, submitted to cytology. The peel-away was then exchanged for a 10.2 [place] biliary drainage catheter, advanced with coil ultimately locked within the duodenum. Contrast was injected and a completion radiograph was obtained. The catheter was connected to a drainage bag which yielded the brisk return of clear bile. The catheter was secured to the skin with an 0 Prolene suture and StatLock. The patient tolerated the procedure well without immediate postprocedural complication.  FINDINGS: The initial percutaneous transhepatic cholangiogram demonstrates marked intrahepatic biliary ductal dilatation. The proximal common duct just beyond the biliary confluence is patent. There is a long irregular stricture of the mid and distal CBD, with associated mucosal irregularity. The duodenum is decompressed.  Percutaneous biliary brush biopsy of the CBD lesion was performed x2.  10 French internal external biliary drain catheter was placed to the duodenum without complication.  IMPRESSION: 1. Percutaneous transhepatic cholangiogram demonstrating marked intrahepatic biliary ductal dilatation, long segment CBD irregularity and narrowing. 2. Technically successful brush biopsy of CBD lesion. 3. Technically successful internal external biliary drain catheter placement. The catheter was left to external drainage to maximize biliary decompression.   Electronically Signed   By: Lucrezia Europe M.D.   On: 01/10/2015 13:00   Ir Endoluminal Bx Of Biliary Tree  01/10/2015   INDICATION: Obstructing pancreatic mass. Intra and extrahepatic biliary ductal dilatation. Remote history of tonsillar carcinoma with resultant stricturing, complicating attempts at endoscopy and intubation.  EXAM: ULTRASOUND AND FLUOROSCOIC GUIDED PERCUTANEOUS TRANSHEPATIC CHOLANGIOGRAM  INTERNAL/EXTERNAL BILIARY DRAIN TUBE PLACEMENT  BILIARY BRUSH BIOPSY  COMPARISON:  MR 01/07/2015 AND  EARLIER STUDIES  MEDICATIONS: cefazolin 2g preprocedural  CONTRAST:  65m OMNIPAQUE IOHEXOL 300 MG/ML  SOLN  ANESTHESIA/SEDATION: Intravenous Fentanyl and Versed were administered as conscious sedation during continuous cardiorespiratory monitoring by the radiology RN, with a total moderate sedation time of 32 minutes.  FLUOROSCOPY TIME:  8 minutes 24 seconds, 419   mGy.  COMPLICATIONS: None immediate  TECHNIQUE: Informed written consent was obtained from [employer]after a discussion of the risks, benefits and alternatives to treatment. Questions regarding the procedure were encouraged and answered. A timeout was performed prior to the initiation of the procedure.  The right upper abdominal quadrant was prepped and draped in the usual sterile fashion, and a sterile drape was applied covering the operative field. Maximum barrier sterile technique with sterile gowns and gloves were used for the procedure. A timeout was performed prior to the initiation of the procedure.  Ultrasound scanning of the right upper abdominal quadrant was performed to delineate the anatomy and avoid transgression of the gallbladder or the pleural. An entry site in the right epigastric region was identified .  After the overlying soft tissues were anesthetized with 1% Lidocaine , a 21 gauge micropuncture needle was utilized to access the peripheral aspect of an anterior left hepatic duct. A 018 guidewire advanced centrally easily. A 3 French micro dilator was placed over the guidewire and contrast injection confirmed appropriate positioning in the biliary tree. The micro dilator was exchanged for a transitional dilator, exchanged for a Kumpe catheter over a Benson wire. With the use of a regular glide wire, the Kumpe catheter was advanced central to an apparent CBD  structure, and ultimately into the distal duodenum.  Over an Amplatz stiff wire, the tract was dilated and a 9 [place] long peel-away sheath was advanced. The dilator was  removed and biliary brush biopsy of the CBD obstructive region performed x2, submitted to cytology. The peel-away was then exchanged for a 10.2 [place] biliary drainage catheter, advanced with coil ultimately locked within the duodenum. Contrast was injected and a completion radiograph was obtained. The catheter was connected to a drainage bag which yielded the brisk return of clear bile. The catheter was secured to the skin with an 0 Prolene suture and StatLock. The patient tolerated the procedure well without immediate postprocedural complication.  FINDINGS: The initial percutaneous transhepatic cholangiogram demonstrates marked intrahepatic biliary ductal dilatation. The proximal common duct just beyond the biliary confluence is patent. There is a long irregular stricture of the mid and distal CBD, with associated mucosal irregularity. The duodenum is decompressed.  Percutaneous biliary brush biopsy of the CBD lesion was performed x2.  10 French internal external biliary drain catheter was placed to the duodenum without complication.  IMPRESSION: 1. Percutaneous transhepatic cholangiogram demonstrating marked intrahepatic biliary ductal dilatation, long segment CBD irregularity and narrowing. 2. Technically successful brush biopsy of CBD lesion. 3. Technically successful internal external biliary drain catheter placement. The catheter was left to external drainage to maximize biliary decompression.   Electronically Signed   By: Lucrezia Europe M.D.   On: 01/10/2015 13:00    Labs:  CBC:  Recent Labs  01/09/15 2108 01/10/15 0713 01/11/15 0430 01/12/15 0347  WBC 14.7* 13.4* 10.7* 10.9*  HGB 9.0* 8.3* 9.1* 8.9*  HCT 25.3* 23.7* 26.0* 27.0*  PLT 449* 415* 409* 426*    COAGS:  Recent Labs  03/13/14 0425 01/09/15 1417 01/09/15 2108  INR 1.1 1.08 1.19  APTT 27.7 25  --     BMP:  Recent Labs  01/12/15 0347 01/12/15 1050 01/12/15 1440 01/13/15 0431  NA 127* 129* 127* 131*  K 3.4* 3.3* 3.4*  4.1  CL 93* 96* 94* 98*  CO2 _0 GLUCOSE 120* 162* 147* 146*  BUN 22* 24* 23* 26*  CALCIUM 8.2* 8.1* 8.1* 8.2*  CREATININE 1.37* 1.33* 1.46* 1.61*  GFRNONAA 47* 49* 44* 39*  GFRAA 55* 57* 51* 45*    LIVER FUNCTION TESTS:  Recent Labs  01/10/15 0713 01/11/15 0430 01/12/15 0347 01/12/15 1440 01/13/15 0431  BILITOT 19.1* 14.3* 16.6*  --  20.5*  AST 261* 192* 134*  --  121*  ALT 170* 156* 125*  --  110*  ALKPHOS 759* 754* 686*  --  543*  PROT 5.1* 5.7* 5.5*  --  5.3*  ALBUMIN 1.8* 2.0* 1.9* 1.8* 1.9*    Assessment and Plan: Patient with history of intra-and extra hepatic biliary ductal dilatation, pancreatic mass, status post PTC with internal/external biliary drainage, common bile duct lesion brush biopsy on 01/10/15; 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 Case discussed with Dr. Earleen Newport today. At this point with rising bilirubin recommend follow-up cholangiogram with drain exchange on 10/5. Patient will need follow-up with oncologist and possible surgical referral prior to any further intervention with biliary drain. Above plans discussed with patient and wife with their understanding and consent.  Signed: D. Rowe Robert 01/13/2015, 11:43 AM   I spent a total of 15 minutes at the the patient's bedside  AND on the patient's hospital floor or unit, greater than 50% of which was counseling/coordinating care for biliary drain

## 2015-01-13 NOTE — Progress Notes (Signed)
[facility] PROGRESS NOTE  Geoffrey Gregory HAL:[id number] DOB: Sep 04, 1932 DOA: 01/09/2015 PCP: [name], MD  Summary 81 yo bm transferred from [facility] for biliary drain placement by IR and further workup of pancreatic mass. Was admitted there with sodium of 111 felt to be due to SIADH, jaundice and pancreatic mass. 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 Transferring physician reportedly spoke with interventional radiology and gastroenterology, although it's unclear which gastroenterologist was contacted. Had been on hypertonic saline and hydrochlorothiazide stopped.  Internal and external biliary drain placed. 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  Assessment/Plan:  Principal Problem:   Malignant obstructive jaundice: Status post external and internal biliary drain. Bilirubin decreased initially, now climbing. Discussed with IR. For cholangiogram tomorrow and exchange of drain.  Discussed with [name], oncologist in [place].  His navigator will arrange f/u after patient discharged  Active Problems:   Pancreatic mass:  See above    SIADH (syndrome of inappropriate ADH production): sodium continues to improve. Continue fluid restriction. Creatinine increasing. Will decrease lasix to 20 daily and monitor creatinine. TSH normal. Cortisol 17.     Acid reflux: On PPI    Essential (primary) hypertension: Continue metoprolol. Would not resume thiazide.    Hypercholesteremia    Cancer of tonsil: Status post chemoradiation, in remission per oncology note.     Anemia: No gross bleeding. anemia panel with minimally low iron at 41, but normal ferritin.     Euthyroid sick syndrome: Would repeat thyroid function tests in a few months.  Hypokalemia: Repleted by mouth.  AKI: see above  Code Status:  full Family Communication:  Wife at  bedside Disposition Plan:  Home once bilirubin stabilizes  Consultants:  Interventional radiology   nephrology  Procedures:     Antibiotics:    HPI/Subjective: No complaints  Objective: Filed Vitals:   01/13/15 0950  BP: 118/62  Pulse: 77  Temp: 98 F (36.7 C)  Resp: 18    Intake/Output Summary (Last 24 hours) at 01/13/15 1427 Last data filed at 01/13/15 1300  Gross per 24 hour  Intake   1440 ml  Output   1025 ml  Net    415 ml   Filed Weights   01/11/15 0422 01/12/15 0535 01/12/15 2035  Weight: 63.6 kg (140 lb 3.4 oz) 63.277 kg (139 lb 8 oz) 63.3 kg (139 lb 8.8 oz)    Exam:   General:  Alert, elderly in no acute distress. Oriented.  HEENT: Scleral icterus noted. Dry MM  Cardiovascular: Regular rate rhythm without murmurs gallops rubs  Respiratory: Clear to auscultation bilaterally without wheezes rhonchi or rales  Abdomen: Soft, nontender, nondistended.right upper quadrant drain draining bilious fluid   Ext: No clubbing cyanosis or edema   Basic Metabolic Panel:  Recent Labs Lab 01/11/15 2200 01/12/15 0347 01/12/15 1050 01/12/15 1440 01/13/15 0431  NA 124* 127* 129* 127* 131*  K 3.4* 3.4* 3.3* 3.4* 4.1  CL 90* 93* 96* 94* 98*  CO2 24 24 23 24 25   GLUCOSE 133* 120* 162* 147* 146*  BUN 23* 22* 24* 23* 26*  CREATININE 1.36* 1.37* 1.33* 1.46* 1.61*  CALCIUM 7.9* 8.2* 8.1* 8.1* 8.2*  PHOS  --   --   --  3.7  --    Liver Function Tests:  Recent Labs Lab 01/09/15 2108 01/10/15 7353 01/11/15 0430 01/12/15 0347 01/12/15 1440 01/13/15 0431  AST 247* 261* 192* 134*  --  121*  ALT 165* 170* 156* 125*  --  110*  ALKPHOS 868* 759* 754* 686*  --  543*  BILITOT 20.9* 19.1* 14.3* 16.6*  --  20.5*  PROT 5.5* 5.1* 5.7* 5.5*  --  5.3*  ALBUMIN 2.0* 1.8* 2.0* 1.9* 1.8* 1.9*    Recent Labs Lab 01/07/15 1149  LIPASE 739*   No results for input(s): AMMONIA in the last 168 hours. CBC:  Recent Labs Lab 01/07/15 1149  01/08/15 0428  01/09/15 2108 01/10/15 0713 01/11/15 0430 01/12/15 0347  WBC 8.2  < > 7.6 14.7* 13.4* 10.7* 10.9*  NEUTROABS 6.9*  --   --  13.5* 12.1* 9.3*  --   HGB 10.1*  < > 8.9* 9.0* 8.3* 9.1* 8.9*  HCT 29.5*  < > 26.3* 25.3* 23.7* 26.0* 27.0*  MCV 82.7  < > 83.3 78.6 81.4 82.5 83.6  PLT 471*  < > 427 449* 415* 409* 426*  < > = values in this interval not displayed. Cardiac Enzymes: No results for input(s): CKTOTAL, CKMB, CKMBINDEX, TROPONINI in the last 168 hours. BNP (last 3 results) No results for input(s): BNP in the last 8760 hours.  ProBNP (last 3 results) No results for input(s): PROBNP in the last 8760 hours.  CBG:  Recent Labs Lab 01/08/15 1121 01/08/15 2207 01/09/15 0721 01/09/15 1112 01/09/15 1610  GLUCAP 104* 240* 175* 178* 147*    Recent Results (from the past 240 hour(s))  Urine Culture     Status: None   Collection Time: 01/05/15 12:00 AM  Result Value Ref Range Status   Urine Culture, Routine Final report  Final   Urine Culture result 1 No growth  Final     Studies: No results found.  Scheduled Meds: . furosemide  40 mg Oral BID  . heparin subcutaneous  5,000 Units Subcutaneous 3 times per day  . metoprolol  25 mg Oral BID  . pantoprazole  40 mg Oral Daily  . [START ON 01/14/2015] piperacillin-tazobactam (ZOSYN)  IV  3.375 g Intravenous Once  . sodium chloride  3 mL Intravenous Q12H  . sodium chloride  5 mL Intracatheter Q8H   Continuous Infusions:    Time spent: 25 minutes  [facility] [email or web address], password [facility] 01/13/2015, 2:27 PM  LOS: 4 days

## 2015-01-14 ENCOUNTER — Inpatient Hospital Stay (HOSPITAL_COMMUNITY): Payer: Medicare HMO

## 2015-01-14 DIAGNOSIS — K869 Disease of pancreas, unspecified: Secondary | ICD-10-CM

## 2015-01-14 DIAGNOSIS — D638 Anemia in other chronic diseases classified elsewhere: Secondary | ICD-10-CM

## 2015-01-14 DIAGNOSIS — E0781 Sick-euthyroid syndrome: Secondary | ICD-10-CM

## 2015-01-14 DIAGNOSIS — E871 Hypo-osmolality and hyponatremia: Secondary | ICD-10-CM

## 2015-01-14 LAB — COMPREHENSIVE METABOLIC PANEL
ALT: 107 U/L — AB (ref 17–63)
AST: 119 U/L — AB (ref 15–41)
Albumin: 1.9 g/dL — ABNORMAL LOW (ref 3.5–5.0)
Alkaline Phosphatase: 475 U/L — ABNORMAL HIGH (ref 38–126)
Anion gap: 10 (ref 5–15)
BUN: 31 mg/dL — AB (ref 6–20)
CHLORIDE: 94 mmol/L — AB (ref 101–111)
CO2: 26 mmol/L (ref 22–32)
CREATININE: 1.68 mg/dL — AB (ref 0.61–1.24)
Calcium: 8.4 mg/dL — ABNORMAL LOW (ref 8.9–10.3)
GFR calc Af Amer: 43 mL/min — ABNORMAL LOW (ref >60)
GFR calc non Af Amer: 37 mL/min — ABNORMAL LOW (ref >60)
Glucose, Bld: 132 mg/dL — ABNORMAL HIGH (ref 65–99)
Potassium: 3.6 mmol/L (ref 3.5–5.1)
SODIUM: 130 mmol/L — AB (ref 135–145)
Total Bilirubin: 22.2 mg/dL (ref 0.3–1.2)
Total Protein: 5.9 g/dL — ABNORMAL LOW (ref 6.5–8.1)

## 2015-01-14 LAB — CBC
HCT: 24.4 % — ABNORMAL LOW (ref 39.0–52.0)
Hemoglobin: 8.3 g/dL — ABNORMAL LOW (ref 13.0–17.0)
MCH: 28.5 pg (ref 26.0–34.0)
MCHC: 34 g/dL (ref 30.0–36.0)
MCV: 83.8 fL (ref 78.0–100.0)
PLATELETS: 366 10*3/uL (ref 150–400)
RBC: 2.91 MIL/uL — AB (ref 4.22–5.81)
RDW: 18.4 % — ABNORMAL HIGH (ref 11.5–15.5)
WBC: 11.7 10*3/uL — AB (ref 4.0–10.5)

## 2015-01-14 MED ORDER — IOHEXOL 300 MG/ML  SOLN
50.0000 mL | Freq: Once | INTRAMUSCULAR | Status: DC | PRN
  Administered 2015-01-14: 10 mL via INTRAVENOUS
  Filled 2015-01-14: qty 50

## 2015-01-14 MED ORDER — LIDOCAINE HCL 1 % IJ SOLN
INTRAMUSCULAR | Status: AC
  Filled 2015-01-14: qty 20

## 2015-01-14 NOTE — Procedures (Signed)
Cholangiogram thru existing tube demonstrates partial occlusion of the drain.  The drain was successfully exchanged for a new 10 Fr catheter.  No immediate complication.  No blood loss.

## 2015-01-14 NOTE — Progress Notes (Signed)
PROGRESS NOTE    Geoffrey Gregory EXH:[id number] DOB: 02-24-1933 DOA: 01/09/2015 PCP: [name], MD  HPI/Brief narrative 81 yo bm transferred from [facility] for biliary drain placement by IR and further workup of pancreatic mass. Was admitted there with sodium of 111 felt to be due to SIADH, jaundice and pancreatic mass. 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 Transferring physician reportedly spoke with interventional radiology and gastroenterology, although it's unclear which gastroenterologist was contacted. Had been on hypertonic saline and hydrochlorothiazide- stopped. Internal and external biliary drain placed. 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     Assessment/Plan:  Principal Problem:  Malignant obstructive jaundice:  - Status post external and internal biliary drain.  - Bilirubin decreased initially, now climbing.  - Dr. [name] discussed with IR & with [name], Oncologist in [place] -His navigator will arrange f/u after patient discharged -  Cholangiogram 10/5 through existing tube demonstrated partial occlusion of the train and the drain was successfully exchanged for a new 10 French catheter -  Monitor clinically and CMP in a.m.  Active Problems:  Pancreatic mass:  - See above   SIADH (syndrome of inappropriate ADH production):  - sodium continues to improve. Continue fluid restriction. Creatinine  Has gradually increased from 0.89 on 9/30 to 1.68 on 10/5.  Hold Lasix temporarily. TSH normal. Cortisol 17.  Follow BMP   Acid reflux:  - On PPI   Essential (primary) hypertension:  - Continue metoprolol. Would not resume thiazide. controlled   Hypercholesteremia   Cancer of tonsil:  - Status post chemoradiation, in remission per oncology note.    Anemia:  - No gross bleeding. anemia panel with  minimally low iron at 41, but normal ferritin.  Stable.   Euthyroid sick syndrome:  - Would repeat thyroid function tests in a few months.  Hypokalemia:  - Repleted by mouth.  AKI:  - see above   Type II DM -  Reasonably controlled. Not on meds currently    DVT prophylaxis: SCDs Code Status:  full Family Communication:  Discussed with spouse at bedside on 10/5 Disposition Plan:  DC home when medically stable   Consultants:   Nephrology   Interventional radiology  Procedures:   upsizing of biliary drain by IR on 10/5  Antibiotics:   IV ceftezolin 1 dose on 10/1   IV Zosyn 1 dose on 10/5   Subjective:  seen after procedure this morning. Denied complaints.  Objective: Filed Vitals:   01/13/15 2042 01/14/15 0500 01/14/15 0555 01/14/15 0900  BP: 110/57  99/59 101/62  Pulse: 76  76 76  Temp: 98.9 F (37.2 C)  98.3 F (36.8 C) 98 F (36.7 C)  TempSrc: Oral  Oral Oral  Resp: 17  16 18   Height:      Weight: 63.3 kg (139 lb 8.8 oz) 59.166 kg (130 lb 7 oz)    SpO2: 99%  96% 96%    Intake/Output Summary (Last 24 hours) at 01/14/15 1532 Last data filed at 01/14/15 1300  Gross per 24 hour  Intake    480 ml  Output    350 ml  Net    130 ml   Filed Weights   01/12/15 2035 01/13/15 2042 01/14/15 0500  Weight: 63.3 kg (139 lb 8.8 oz) 63.3 kg (139 lb 8.8 oz) 59.166 kg (130 lb 7 oz)     Exam:  General exam:  Elderly frail male lying comfortably supine in bed. Scleral and skin icterus. Respiratory system: Clear. No increased work of breathing. Cardiovascular system: S1 & S2 heard, RRR. No JVD, murmurs, gallops, clicks or pedal edema. Gastrointestinal system: Abdomen is nondistended, soft and nontender. Normal bowel sounds heard.  Biliary drain + Central nervous system: Alert and oriented. No focal neurological deficits. Extremities: Symmetric 5 x 5 power.   Data Reviewed: Basic Metabolic Panel:  Recent Labs Lab 01/12/15 0347 01/12/15 1050  01/12/15 1440 01/13/15 0431 01/14/15 0510  NA 127* 129* 127* 131* 130*  K 3.4* 3.3* 3.4* 4.1 3.6  CL 93* 96* 94* 98* 94*  CO2 24 23 24 25 26   GLUCOSE 120* 162* 147* 146* 132*  BUN 22* 24* 23* 26* 31*  CREATININE 1.37* 1.33* 1.46* 1.61* 1.68*  CALCIUM 8.2* 8.1* 8.1* 8.2* 8.4*  PHOS  --   --  3.7  --   --    Liver Function Tests:  Recent Labs Lab 01/10/15 0713 01/11/15 0430 01/12/15 0347 01/12/15 1440 01/13/15 0431 01/14/15 0510  AST 261* 192* 134*  --  121* 119*  ALT 170* 156* 125*  --  110* 107*  ALKPHOS 759* 754* 686*  --  543* 475*  BILITOT 19.1* 14.3* 16.6*  --  20.5* 22.2*  PROT 5.1* 5.7* 5.5*  --  5.3* 5.9*  ALBUMIN 1.8* 2.0* 1.9* 1.8* 1.9* 1.9*   No results for input(s): LIPASE, AMYLASE in the last 168 hours. No results for input(s): AMMONIA in the last 168 hours. CBC:  Recent Labs Lab 01/09/15 2108 01/10/15 0713 01/11/15 0430 01/12/15 0347 01/14/15 0510  WBC 14.7* 13.4* 10.7* 10.9* 11.7*  NEUTROABS 13.5* 12.1* 9.3*  --   --   HGB 9.0* 8.3* 9.1* 8.9* 8.3*  HCT 25.3* 23.7* 26.0* 27.0* 24.4*  MCV 78.6 81.4 82.5 83.6 83.8  PLT 449* 415* 409* 426* 366   Cardiac Enzymes: No results for input(s): CKTOTAL, CKMB, CKMBINDEX, TROPONINI in the last 168 hours. BNP (last 3 results) No results for input(s): PROBNP in the last 8760 hours. CBG:  Recent Labs Lab 01/08/15 1121 01/08/15 2207 01/09/15 0721 01/09/15 1112 01/09/15 1610  GLUCAP 104* 240* 175* 178* 147*    Recent Results (from the past 240 hour(s))  Urine Culture     Status: None   Collection Time: 01/05/15 12:00 AM  Result Value Ref Range Status   Urine Culture, Routine Final report  Final   Urine Culture result 1 No growth  Final           Studies: Ir Exchange Biliary Drain  01/14/2015   CLINICAL DATA:  81 year old with pancreatic cancer and biliary obstruction. The patient has an internal/external biliary drain. Despite drain placement, the bilirubin has been increasing. Cholangiogram  scheduled for evaluation of the tube and biliary system.  EXAM: CHOLANGIOGRAM THROUGH EXISTING CATHETER ; EXCHANGE OF PERCUTANEOUS BILIARY CATHETER WITH FLUOROSCOPY  Physician: Stephan Minister. Henn, MD  FLUOROSCOPY TIME:  1 minutes and 24 seconds, 36.6 mGy  MEDICATIONS: None  ANESTHESIA/SEDATION: Moderate sedation time: None  PROCEDURE: The procedure was explained to the patient. The risks and benefits of the procedure were discussed and the patient's questions were addressed. Informed consent was obtained from the patient. The patient was placed supine on the interventional table. Contrast was injected through the existing catheter.  The catheter and surrounding skin were prepped and draped in sterile fashion. Maximal barrier sterile technique was utilized including caps, mask, sterile gowns, sterile gloves, sterile drape,  hand hygiene and skin antiseptic. The retention suture was removed. The catheter was cut and removed over a Bentson wire. A new 10 [place] biliary drain was advanced over the wire and the tip was positioned in the duodenum. Small amount of contrast was injected to confirm placement. Catheter was flushed with saline. A dressing was placed over the catheter site.  FINDINGS: The left internal/external biliary drain was stable in position from the prior examination. The intrahepatic ducts are decompressed and appears to be draining both the left and right ducts. The distal aspect of the biliary drain was patent but there was sluggish flow and appeared to be partial occlusion. New catheter was positioned in the biliary system with the tip in the duodenum. New catheter is widely patent following contrast injection.  Estimated blood loss: None  CONTRAST:  10 mL Omnipaque 623  COMPLICATIONS: None  IMPRESSION: The biliary drain was patent but there was sluggish flow through the distal component. Therefore, the biliary drain was exchanged. Continue to follow liver enzymes.   Electronically Signed   By: Markus Daft  M.D.   On: 01/14/2015 10:34        Scheduled Meds: . furosemide  20 mg Oral Daily  . metoprolol  25 mg Oral BID  . pantoprazole  40 mg Oral Daily  . piperacillin-tazobactam (ZOSYN)  IV  3.375 g Intravenous Once  . sodium chloride  3 mL Intravenous Q12H  . sodium chloride  5 mL Intracatheter Q8H   Continuous Infusions:   Principal Problem:   Malignant obstructive jaundice Active Problems:   Dysphagia, oropharyngeal   Acid reflux   Essential (primary) hypertension   Hypercholesteremia   Cancer of tonsil (HCC)   Pancreatic mass   SIADH (syndrome of inappropriate ADH production) (HCC)   Anemia   Hyperbilirubinemia   Euthyroid sick syndrome   AKI (acute kidney injury) ([place])   Biliary obstruction    Time spent: 20 minutes.    Vernell Leep, MD, FACP, FHM. [facility] Pager [phone]  If 7PM-7AM, please contact night-coverage [email or web address] Password [email or web address] 01/14/2015, 3:32 PM    LOS: 5 days

## 2015-01-15 ENCOUNTER — Ambulatory Visit: Payer: Medicare HMO

## 2015-01-15 DIAGNOSIS — C259 Malignant neoplasm of pancreas, unspecified: Secondary | ICD-10-CM | POA: Insufficient documentation

## 2015-01-15 LAB — COMPREHENSIVE METABOLIC PANEL
ALK PHOS: 422 U/L — AB (ref 38–126)
ALT: 107 U/L — AB (ref 17–63)
ANION GAP: 12 (ref 5–15)
AST: 115 U/L — ABNORMAL HIGH (ref 15–41)
Albumin: 1.9 g/dL — ABNORMAL LOW (ref 3.5–5.0)
BILIRUBIN TOTAL: 19.8 mg/dL — AB (ref 0.3–1.2)
BUN: 35 mg/dL — ABNORMAL HIGH (ref 6–20)
CALCIUM: 8.5 mg/dL — AB (ref 8.9–10.3)
CO2: 27 mmol/L (ref 22–32)
CREATININE: 1.72 mg/dL — AB (ref 0.61–1.24)
Chloride: 90 mmol/L — ABNORMAL LOW (ref 101–111)
GFR, EST AFRICAN AMERICAN: 42 mL/min — AB (ref >60)
GFR, EST NON AFRICAN AMERICAN: 36 mL/min — AB (ref >60)
Glucose, Bld: 130 mg/dL — ABNORMAL HIGH (ref 65–99)
Potassium: 3.6 mmol/L (ref 3.5–5.1)
SODIUM: 129 mmol/L — AB (ref 135–145)
TOTAL PROTEIN: 5.6 g/dL — AB (ref 6.5–8.1)

## 2015-01-15 LAB — GRAM STAIN

## 2015-01-15 LAB — CBC
HCT: 23.8 % — ABNORMAL LOW (ref 39.0–52.0)
HEMOGLOBIN: 8.2 g/dL — AB (ref 13.0–17.0)
MCH: 28.8 pg (ref 26.0–34.0)
MCHC: 34.5 g/dL (ref 30.0–36.0)
MCV: 83.5 fL (ref 78.0–100.0)
PLATELETS: 345 10*3/uL (ref 150–400)
RBC: 2.85 MIL/uL — AB (ref 4.22–5.81)
RDW: 19.1 % — ABNORMAL HIGH (ref 11.5–15.5)
WBC: 10.6 10*3/uL — AB (ref 4.0–10.5)

## 2015-01-15 MED ORDER — FLUCONAZOLE 100 MG PO TABS
200.0000 mg | ORAL_TABLET | ORAL | Status: DC
  Administered 2015-01-15 – 2015-01-22 (×8): 200 mg via ORAL
  Filled 2015-01-15 (×8): qty 2

## 2015-01-15 NOTE — Progress Notes (Signed)
Patient ID: Geoffrey Gregory, male   DOB: 06-14-1932, 81 y.o.   MRN: [id number]    Referring Physician(s): [facility]  Chief Complaint:  Pancreatic cancer, biliary obstruction  Subjective: Pt eating lunch; no new c/o; some soreness at biliary drain site   Allergies: Review of patient's allergies indicates no known allergies.  Medications: Prior to Admission medications   Medication Sig Start Date End Date Taking? Authorizing Provider  acetaminophen (TYLENOL) 500 MG tablet Take 500 mg by mouth every 6 (six) hours as needed.   Yes Historical Provider, MD  aspirin 81 MG tablet Take by mouth daily. 06/08/11  Yes Historical Provider, MD  cholecalciferol (VITAMIN D) 1000 UNITS tablet Take 1,000 Units by mouth daily.   Yes Historical Provider, MD  lovastatin (MEVACOR) 40 MG tablet Take 40 mg by mouth at bedtime.   Yes Historical Provider, MD  metoprolol (LOPRESSOR) 50 MG tablet Take 1 tablet (50 mg total) by mouth 2 (two) times daily. 10/17/14  Yes [name], MD  omeprazole (PRILOSEC) 40 MG capsule Take 40 mg by mouth daily.  12/11/13  Yes Historical Provider, MD     Vital Signs: BP 122/60 mmHg  Pulse 88  Temp(Src) 98.3 F (36.8 C) (Oral)  Resp 16  Ht _0  (1.702 m)  Wt 132 lb 4.9 oz (60.014 kg)  BMI 20.72 kg/m2  SpO2 100%  Physical Exambiliary drain intact, insertion site clean and dry, mildly tender, output 675 cc green bile  Imaging: Ir Exchange Biliary Drain  01/14/2015   CLINICAL DATA:  81 year old with pancreatic cancer and biliary obstruction. The patient has an internal/external biliary drain. Despite drain placement, the bilirubin has been increasing. Cholangiogram scheduled for evaluation of the tube and biliary system.  EXAM: CHOLANGIOGRAM THROUGH EXISTING CATHETER ; EXCHANGE OF PERCUTANEOUS BILIARY CATHETER WITH FLUOROSCOPY  Physician: Stephan Minister. Henn, MD  FLUOROSCOPY TIME:  1 minutes and 24 seconds, 36.6 mGy  MEDICATIONS: None  ANESTHESIA/SEDATION: Moderate sedation time:  None  PROCEDURE: The procedure was explained to the patient. The risks and benefits of the procedure were discussed and the patient's questions were addressed. Informed consent was obtained from the patient. The patient was placed supine on the interventional table. Contrast was injected through the existing catheter.  The catheter and surrounding skin were prepped and draped in sterile fashion. Maximal barrier sterile technique was utilized including caps, mask, sterile gowns, sterile gloves, sterile drape, hand hygiene and skin antiseptic. The retention suture was removed. The catheter was cut and removed over a Bentson wire. A new 10 [place] biliary drain was advanced over the wire and the tip was positioned in the duodenum. Small amount of contrast was injected to confirm placement. Catheter was flushed with saline. A dressing was placed over the catheter site.  FINDINGS: The left internal/external biliary drain was stable in position from the prior examination. The intrahepatic ducts are decompressed and appears to be draining both the left and right ducts. The distal aspect of the biliary drain was patent but there was sluggish flow and appeared to be partial occlusion. New catheter was positioned in the biliary system with the tip in the duodenum. New catheter is widely patent following contrast injection.  Estimated blood loss: None  CONTRAST:  10 mL Omnipaque 297  COMPLICATIONS: None  IMPRESSION: The biliary drain was patent but there was sluggish flow through the distal component. Therefore, the biliary drain was exchanged. Continue to follow liver enzymes.   Electronically Signed   By: Markus Daft  M.D.   On: 01/14/2015 10:34    Labs:  CBC:  Recent Labs  01/11/15 0430 01/12/15 0347 01/14/15 0510 01/15/15 0500  WBC 10.7* 10.9* 11.7* 10.6*  HGB 9.1* 8.9* 8.3* 8.2*  HCT 26.0* 27.0* 24.4* 23.8*  PLT 409* 426* 366 345    COAGS:  Recent Labs  03/13/14 0425 01/09/15 1417 01/09/15 2108  INR  1.1 1.08 1.19  APTT 27.7 25  --     BMP:  Recent Labs  01/12/15 1440 01/13/15 0431 01/14/15 0510 01/15/15 0500  NA 127* 131* 130* 129*  K 3.4* 4.1 3.6 3.6  CL 94* 98* 94* 90*  CO2 _0 GLUCOSE 147* 146* 132* 130*  BUN 23* 26* 31* 35*  CALCIUM 8.1* 8.2* 8.4* 8.5*  CREATININE 1.46* 1.61* 1.68* 1.72*  GFRNONAA 44* 39* 37* 36*  GFRAA 51* 45* 43* 42*    LIVER FUNCTION TESTS:  Recent Labs  01/12/15 0347 01/12/15 1440 01/13/15 0431 01/14/15 0510 01/15/15 0500  BILITOT 16.6*  --  20.5* 22.2* 19.8*  AST 134*  --  121* 119* 115*  ALT 125*  --  110* 107* 107*  ALKPHOS 686*  --  543* 475* 422*  PROT 5.5*  --  5.3* 5.9* 5.6*  ALBUMIN 1.9* 1.8* 1.9* 1.9* 1.9*    Assessment and Plan: Patient with history of intra-and extra hepatic biliary ductal dilatation, pancreatic mass, status post PTC with internal/external biliary drainage, common bile duct lesion brush biopsy on 01/10/15; drain exchanged 10/5; pathology from CBD brush biopsy revealed adenocarcinoma; afebrile; t bili down slightly down to 19.8 today from 22; cont current tx; monitor electrolytes/hepatic function;  patient will need follow-up with oncologist ([name]) and possible surgical referral prior to any further intervention with biliary drain.    Signed: D. Rowe Robert 01/15/2015, 1:08 PM   I spent a total of 15 minutes at the the patient's bedside AND on the patient's hospital floor or unit, greater than 50% of which was counseling/coordinating care for biliary drain

## 2015-01-15 NOTE — Progress Notes (Signed)
Results for BJORN, HALLAS (MRN [id number]) as of 01/15/2015 06:07  Ref. Range 01/15/2015 05:00  Total Bilirubin Latest Ref Range: 0.3-1.2 mg/dL 19.8 (HH)  NP on call notified.

## 2015-01-15 NOTE — Progress Notes (Signed)
ANTIBIOTIC CONSULT NOTE - INITIAL  Pharmacy Consult for Fluconazole Indication: biliary yeast infection  No Known Allergies  Patient Measurements: Height: 5\' 7"  (170.2 cm) Weight: 132 lb 4.9 oz (60.014 kg) IBW/kg (Calculated) : 66.1 Adjusted Body Weight:   Vital Signs: Temp: 98.3 F (36.8 C) (10/06 0946) Temp Source: Oral (10/06 0946) BP: 122/60 mmHg (10/06 0946) Pulse Rate: 88 (10/06 0946) Intake/Output from previous day: 10/05 0701 - 10/06 0700 In: 1090 [P.O.:1040; IV Piggyback:50] Out: 975 [Urine:300; Drains:675] Intake/Output from this shift: Total I/O In: 480 [P.O.:480] Out: 100 [Drains:100]  Labs:  Recent Labs  01/13/15 0431 01/14/15 0510 01/15/15 0500  WBC  --  11.7* 10.6*  HGB  --  8.3* 8.2*  PLT  --  366 345  CREATININE 1.61* 1.68* 1.72*   Estimated Creatinine Clearance: 29.6 mL/min (by C-G formula based on Cr of 1.72). No results for input(s): VANCOTROUGH, VANCOPEAK, VANCORANDOM, GENTTROUGH, GENTPEAK, GENTRANDOM, TOBRATROUGH, TOBRAPEAK, TOBRARND, AMIKACINPEAK, AMIKACINTROU, AMIKACIN in the last 72 hours.   Microbiology: Recent Results (from the past 720 hour(s))  Urine Culture     Status: None   Collection Time: 01/05/15 12:00 AM  Result Value Ref Range Status   Urine Culture, Routine Final report  Final   Urine Culture result 1 No growth  Final  Culture, body fluid-bottle     Status: None (Preliminary result)   Collection Time: 01/15/15  1:55 PM  Result Value Ref Range Status   Specimen Description BILE  Final   Special Requests BAA 10MLS  Final   Culture PENDING  Incomplete   Report Status PENDING  Incomplete  Gram stain     Status: None   Collection Time: 01/15/15  1:55 PM  Result Value Ref Range Status   Specimen Description BILE  Final   Special Requests NONE  Final   Gram Stain   Final    YEAST Gram Stain Report Called to,Read Back By and Verified WithAddison Lank RN [id number] 01/15/15  A BROWNING    Report Status 01/15/2015 FINAL  Final     Medical History: Past Medical History  Diagnosis Date  . Hyperlipidemia   . Cancer [facility]) Feb 2015    tonsil, chemo/radiation  . Hypertension   . Diabetes mellitus without complication ([place])   . CAD (coronary artery disease)   . Vitamin D deficiency   . AAA (abdominal aortic aneurysm) (HCC)     Medications:  Prescriptions prior to admission  Medication Sig Dispense Refill Last Dose  . acetaminophen (TYLENOL) 500 MG tablet Take 500 mg by mouth every 6 (six) hours as needed.   Past Week at Unknown time  . aspirin 81 MG tablet Take by mouth daily.   01/07/2015 at Unknown time  . cholecalciferol (VITAMIN D) 1000 UNITS tablet Take 1,000 Units by mouth daily.   01/09/2015 at Unknown time  . lovastatin (MEVACOR) 40 MG tablet Take 40 mg by mouth at bedtime.   Past Week at Unknown time  . metoprolol (LOPRESSOR) 50 MG tablet Take 1 tablet (50 mg total) by mouth 2 (two) times daily. 60 tablet 6 01/09/2015 at 8a  . omeprazole (PRILOSEC) 40 MG capsule Take 40 mg by mouth daily.    Past Week at Unknown time   Scheduled:  . metoprolol  25 mg Oral BID  . pantoprazole  40 mg Oral Daily  . sodium chloride  3 mL Intravenous Q12H  . sodium chloride  5 mL Intracatheter Q8H   Infusions:     Assessment: 81yo  male with history of HLD, tonsillar CA, HTN, DM2, CAD and AAA presents from [facility] for biliary drain placement and pancreatic mass workup. Pharmacy is consulted to dose fluconazole for biliary yeast infection. Pt is afebrile, WBC 10.6, sCr 1.7 and trending up.  Goal of Therapy:  Eradication of infection  Plan:  Fluconazole 200mg  PO q24h Follow up culture results, renal function and clinical course  Geoffrey Gregory. Diona Foley, PharmD Clinical Pharmacist Pager [phone] 01/15/2015,4:41 PM

## 2015-01-15 NOTE — Progress Notes (Signed)
PROGRESS NOTE    Geoffrey Gregory JSE:[id number] DOB: January 04, 1933 DOA: 01/09/2015 PCP: [name], MD  HPI/Brief narrative 81 yo bm transferred from [facility] for biliary drain placement by IR and further workup of pancreatic mass. Was admitted there with sodium of 111 felt to be due to SIADH, jaundice and pancreatic mass. 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 Transferring physician reportedly spoke with interventional radiology and gastroenterology, although it's unclear which gastroenterologist was contacted. Had been on hypertonic saline and hydrochlorothiazide- stopped. Internal and external biliary drain placed. 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   Assessment/Plan:  Principal Problem:  Malignant obstructive jaundice:  - Status post external and internal biliary drain.  - Bilirubin decreased initially then started climbing.  - Dr. [name] discussed with IR & with [name], Oncologist in [place] -His navigator will arrange f/u after patient discharged -  Cholangiogram 10/5 through existing tube demonstrated partial occlusion of the train and the drain was successfully exchanged for a new 10 French catheter -  Monitor clinically and CMP daily-improving bilirubin. Continue to follow daily LFTs and if continued improving trend, DC home in 48-72 hours.  Active Problems:  Pancreatic mass:  - See above   SIADH (syndrome of inappropriate ADH production):  - sodium improved and stabilized. Continue fluid restriction. Creatinine  Has gradually increased from 0.89 on 9/30 to 1.7 on 10/5.  DC'd Lasix after he received 10/5 dose. TSH normal. Cortisol 17.  Follow BMP   Acid reflux:  - On PPI   Essential (primary) hypertension:  - Continue metoprolol. Would not resume thiazide. controlled   Hypercholesteremia   Cancer of  tonsil:  - Status post chemoradiation, in remission per oncology note.    Anemia:  - No gross bleeding. anemia panel with minimally low iron at 41, but normal ferritin.  Stable.   Euthyroid sick syndrome:  - Would repeat thyroid function tests in a few months.  Hypokalemia:  - Repleted by mouth.  AKI:  - see above. Lasix discontinued after he received 10/5 dose. Follow BMP.   Type II DM -  Reasonably controlled. Not on meds currently    DVT prophylaxis: SCDs Code Status:  full Family Communication:  Discussed with spouse at bedside on 10/6 Disposition Plan:  DC home when medically stable-pending improvement of LFTs.   Consultants:   Nephrology   Interventional radiology  Procedures:   upsizing of biliary drain by IR on 10/5  Antibiotics:   IV ceftezolin 1 dose on 10/1   IV Zosyn 1 dose on 10/5   Subjective: Denied complaints.  Objective: Filed Vitals:   01/14/15 1655 01/14/15 2036 01/15/15 0507 01/15/15 0946  BP: 107/51 111/63 120/56 122/60  Pulse: 79 84 77 88  Temp: 98.3 F (36.8 C) 98.3 F (36.8 C) 98.3 F (36.8 C) 98.3 F (36.8 C)  TempSrc: Oral Oral Oral Oral  Resp: 18 16 16    Height:      Weight:  60.014 kg (132 lb 4.9 oz)    SpO2: 98% 98% 98% 100%    Intake/Output Summary (Last 24 hours) at 01/15/15 1125 Last data filed at 01/15/15 0800  Gross per 24 hour  Intake   1330 ml  Output    875 ml  Net    455 ml   Filed Weights   01/13/15 2042 01/14/15 0500 01/14/15 2036  Weight: 63.3 kg (139  lb 8.8 oz) 59.166 kg (130 lb 7 oz) 60.014 kg (132 lb 4.9 oz)     Exam:  General exam:  Elderly frail male lying comfortably supine in bed. Scleral and skin icterus. Respiratory system: Clear. No increased work of breathing. Cardiovascular system: S1 & S2 heard, RRR. No JVD, murmurs, gallops, clicks or pedal edema. Gastrointestinal system: Abdomen is nondistended, soft and nontender. Normal bowel sounds heard.  Biliary drain + Central nervous  system: Alert and oriented. No focal neurological deficits. Extremities: Symmetric 5 x 5 power.   Data Reviewed: Basic Metabolic Panel:  Recent Labs Lab 01/12/15 1050 01/12/15 1440 01/13/15 0431 01/14/15 0510 01/15/15 0500  NA 129* 127* 131* 130* 129*  K 3.3* 3.4* 4.1 3.6 3.6  CL 96* 94* 98* 94* 90*  CO2 23 24 25 26 27   GLUCOSE 162* 147* 146* 132* 130*  BUN 24* 23* 26* 31* 35*  CREATININE 1.33* 1.46* 1.61* 1.68* 1.72*  CALCIUM 8.1* 8.1* 8.2* 8.4* 8.5*  PHOS  --  3.7  --   --   --    Liver Function Tests:  Recent Labs Lab 01/11/15 0430 01/12/15 0347 01/12/15 1440 01/13/15 0431 01/14/15 0510 01/15/15 0500  AST 192* 134*  --  121* 119* 115*  ALT 156* 125*  --  110* 107* 107*  ALKPHOS 754* 686*  --  543* 475* 422*  BILITOT 14.3* 16.6*  --  20.5* 22.2* 19.8*  PROT 5.7* 5.5*  --  5.3* 5.9* 5.6*  ALBUMIN 2.0* 1.9* 1.8* 1.9* 1.9* 1.9*   No results for input(s): LIPASE, AMYLASE in the last 168 hours. No results for input(s): AMMONIA in the last 168 hours. CBC:  Recent Labs Lab 01/09/15 2108 01/10/15 0713 01/11/15 0430 01/12/15 0347 01/14/15 0510 01/15/15 0500  WBC 14.7* 13.4* 10.7* 10.9* 11.7* 10.6*  NEUTROABS 13.5* 12.1* 9.3*  --   --   --   HGB 9.0* 8.3* 9.1* 8.9* 8.3* 8.2*  HCT 25.3* 23.7* 26.0* 27.0* 24.4* 23.8*  MCV 78.6 81.4 82.5 83.6 83.8 83.5  PLT 449* 415* 409* 426* 366 345   Cardiac Enzymes: No results for input(s): CKTOTAL, CKMB, CKMBINDEX, TROPONINI in the last 168 hours. BNP (last 3 results) No results for input(s): PROBNP in the last 8760 hours. CBG:  Recent Labs Lab 01/08/15 2207 01/09/15 0721 01/09/15 1112 01/09/15 1610  GLUCAP 240* 175* 178* 147*    No results found for this or any previous visit (from the past 240 hour(s)).         Studies: Ir Exchange Biliary Drain  01/14/2015   CLINICAL DATA:  81 year old with pancreatic cancer and biliary obstruction. The patient has an internal/external biliary drain. Despite drain  placement, the bilirubin has been increasing. Cholangiogram scheduled for evaluation of the tube and biliary system.  EXAM: CHOLANGIOGRAM THROUGH EXISTING CATHETER ; EXCHANGE OF PERCUTANEOUS BILIARY CATHETER WITH FLUOROSCOPY  Physician: Stephan Minister. Henn, MD  FLUOROSCOPY TIME:  1 minutes and 24 seconds, 36.6 mGy  MEDICATIONS: None  ANESTHESIA/SEDATION: Moderate sedation time: None  PROCEDURE: The procedure was explained to the patient. The risks and benefits of the procedure were discussed and the patient's questions were addressed. Informed consent was obtained from the patient. The patient was placed supine on the interventional table. Contrast was injected through the existing catheter.  The catheter and surrounding skin were prepped and draped in sterile fashion. Maximal barrier sterile technique was utilized including caps, mask, sterile gowns, sterile gloves, sterile drape, hand hygiene and skin antiseptic. The retention suture  was removed. The catheter was cut and removed over a Bentson wire. A new 10 [place] biliary drain was advanced over the wire and the tip was positioned in the duodenum. Small amount of contrast was injected to confirm placement. Catheter was flushed with saline. A dressing was placed over the catheter site.  FINDINGS: The left internal/external biliary drain was stable in position from the prior examination. The intrahepatic ducts are decompressed and appears to be draining both the left and right ducts. The distal aspect of the biliary drain was patent but there was sluggish flow and appeared to be partial occlusion. New catheter was positioned in the biliary system with the tip in the duodenum. New catheter is widely patent following contrast injection.  Estimated blood loss: None  CONTRAST:  10 mL Omnipaque 476  COMPLICATIONS: None  IMPRESSION: The biliary drain was patent but there was sluggish flow through the distal component. Therefore, the biliary drain was exchanged. Continue to  follow liver enzymes.   Electronically Signed   By: Markus Daft M.D.   On: 01/14/2015 10:34        Scheduled Meds: . metoprolol  25 mg Oral BID  . pantoprazole  40 mg Oral Daily  . sodium chloride  3 mL Intravenous Q12H  . sodium chloride  5 mL Intracatheter Q8H   Continuous Infusions:   Principal Problem:   Malignant obstructive jaundice Active Problems:   Dysphagia, oropharyngeal   Acid reflux   Essential (primary) hypertension   Hypercholesteremia   Cancer of tonsil (HCC)   Pancreatic mass   SIADH (syndrome of inappropriate ADH production) (HCC)   Anemia   Hyperbilirubinemia   Euthyroid sick syndrome   AKI (acute kidney injury) ([place])   Biliary obstruction    Time spent: 20 minutes.    Vernell Leep, MD, FACP, FHM. [facility] Pager [phone]  If 7PM-7AM, please contact night-coverage [email or web address] Password [email or web address] 01/15/2015, 11:25 AM    LOS: 6 days

## 2015-01-15 NOTE — Care Management Important Message (Signed)
Important Message  Patient Details  Name: Geoffrey Gregory MRN: [id number] Date of Birth: [birth year]   Medicare Important Message Given:  Yes-third notification given    Delorse Lek 01/15/2015, 1:18 PM

## 2015-01-16 ENCOUNTER — Inpatient Hospital Stay (HOSPITAL_COMMUNITY): Payer: Medicare HMO

## 2015-01-16 DIAGNOSIS — R7989 Other specified abnormal findings of blood chemistry: Secondary | ICD-10-CM

## 2015-01-16 DIAGNOSIS — E46 Unspecified protein-calorie malnutrition: Secondary | ICD-10-CM

## 2015-01-16 DIAGNOSIS — C259 Malignant neoplasm of pancreas, unspecified: Secondary | ICD-10-CM | POA: Insufficient documentation

## 2015-01-16 DIAGNOSIS — E222 Syndrome of inappropriate secretion of antidiuretic hormone: Secondary | ICD-10-CM

## 2015-01-16 DIAGNOSIS — C099 Malignant neoplasm of tonsil, unspecified: Secondary | ICD-10-CM

## 2015-01-16 DIAGNOSIS — D630 Anemia in neoplastic disease: Secondary | ICD-10-CM

## 2015-01-16 LAB — COMPREHENSIVE METABOLIC PANEL
ALBUMIN: 1.8 g/dL — AB (ref 3.5–5.0)
ALT: 114 U/L — ABNORMAL HIGH (ref 17–63)
ANION GAP: 12 (ref 5–15)
AST: 149 U/L — ABNORMAL HIGH (ref 15–41)
Alkaline Phosphatase: 382 U/L — ABNORMAL HIGH (ref 38–126)
BUN: 35 mg/dL — ABNORMAL HIGH (ref 6–20)
CHLORIDE: 90 mmol/L — AB (ref 101–111)
CO2: 25 mmol/L (ref 22–32)
Calcium: 8.5 mg/dL — ABNORMAL LOW (ref 8.9–10.3)
Creatinine, Ser: 1.57 mg/dL — ABNORMAL HIGH (ref 0.61–1.24)
GFR calc non Af Amer: 40 mL/min — ABNORMAL LOW (ref >60)
GFR, EST AFRICAN AMERICAN: 47 mL/min — AB (ref >60)
GLUCOSE: 149 mg/dL — AB (ref 65–99)
Potassium: 3.6 mmol/L (ref 3.5–5.1)
SODIUM: 127 mmol/L — AB (ref 135–145)
Total Bilirubin: 19.4 mg/dL (ref 0.3–1.2)
Total Protein: 5.9 g/dL — ABNORMAL LOW (ref 6.5–8.1)

## 2015-01-16 MED ORDER — IOHEXOL 300 MG/ML  SOLN
25.0000 mL | INTRAMUSCULAR | Status: AC
  Administered 2015-01-16 (×2): 25 mL via ORAL

## 2015-01-16 MED ORDER — ENOXAPARIN SODIUM 40 MG/0.4ML ~~LOC~~ SOLN
40.0000 mg | SUBCUTANEOUS | Status: DC
  Administered 2015-01-16 – 2015-01-18 (×3): 40 mg via SUBCUTANEOUS
  Filled 2015-01-16 (×3): qty 0.4

## 2015-01-16 MED ORDER — FUROSEMIDE 20 MG PO TABS
20.0000 mg | ORAL_TABLET | Freq: Every day | ORAL | Status: DC
  Administered 2015-01-16 – 2015-01-22 (×7): 20 mg via ORAL
  Filled 2015-01-16 (×7): qty 1

## 2015-01-16 MED ORDER — CIPROFLOXACIN HCL 500 MG PO TABS
500.0000 mg | ORAL_TABLET | Freq: Two times a day (BID) | ORAL | Status: DC
  Administered 2015-01-16 – 2015-01-22 (×12): 500 mg via ORAL
  Filled 2015-01-16 (×12): qty 1

## 2015-01-16 NOTE — Progress Notes (Signed)
   01/16/15 1610  Clinical Encounter Type  Visited With Patient and family together  Visit Type Spiritual support  Referral From Nurse  Spiritual Encounters  Spiritual Needs Emotional  Stress Factors  Patient Stress Factors Health changes;Major life changes  Family Stress Factors Health changes;Exhausted  Visited patient at request of nurse on floor. Patient and wife seemed receptive to visit. Shared info about family and commitment to/faith in God. Expressed gratitude for blessings of 62 years and 73 years of marriage. Wife teased patient and indicated she does not like to go home and be away from him.

## 2015-01-16 NOTE — Progress Notes (Signed)
  Oncology Nurse Navigator Documentation      Patient Visit Type: Inpatient;Follow-up (01/16/15 1400)                    Time Spent with Patient: 15 (01/16/15 1400)   Appt has been made for 01/22/15 at 1400 with Dr [name] for Medical Oncology follow up. Will notify patient after hospital discharge.

## 2015-01-16 NOTE — Progress Notes (Addendum)
PROGRESS NOTE    Geoffrey Gregory HYW:[id number] DOB: 09/03/1932 DOA: 01/09/2015 PCP: [name], MD  HPI/Brief narrative 81 yo bm transferred from [facility] for biliary drain placement by IR and further workup of pancreatic mass. Was admitted there with sodium of 111 felt to be due to SIADH, jaundice and pancreatic mass. 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 Transferring physician reportedly spoke with interventional radiology and gastroenterology, although it's unclear which gastroenterologist was contacted. Had been on hypertonic saline and hydrochlorothiazide- stopped. Internal and external biliary drain placed. 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 After biliary drain was upsized, total bilirubin transiently dropped from 22-19 but has plateaued. Discussed with [facility] oncology who recommended getting inpatient medical oncology consultation.   Assessment/Plan:  Principal Problem:  Malignant obstructive jaundice:  - Status post external and internal biliary drain.  - Bilirubin decreased initially then started climbing.  - Dr. [name] discussed with IR & with [name], Oncologist in [place] -His navigator will arrange f/u after patient discharged -  Cholangiogram 10/5 through existing tube demonstrated partial occlusion of the train and the drain was successfully exchanged for a new 10 French catheter -  After biliary drain was upsized, total bilirubin transiently dropped from 22 >19 but has plateaued. Discussed with Oncology (Dr. Metro Kung patner) on 10/7 recommended inpatient oncology consultation. Discussed with Dr. Alvy Bimler who recommended CT neck, chest, abdomen and pelvis without contrast to assess for spread. - Possible biliary infection with yeast and GNR: Started Cipro 10/7 and fluconazole 10/6.  Active Problems:   Pancreatic mass:  - See above   SIADH (syndrome of inappropriate ADH production):  - Sodium had gradually improved after fluid restriction and Lasix. Lasix was temporarily stopped for 2 days due to worsening creatinine. Sodium has again started to drop. Discussed with Dr. Jimmy Footman, nephrology on 10/7: Recommends resuming Lasix 20 mg orally daily and we'll have to accept increased creatinine in the 1.5-2 range. Prognosis overall poor.   Acid reflux:  - On PPI   Essential (primary) hypertension:  - Continue metoprolol. Would not resume thiazide. controlled   Hypercholesteremia   Cancer of tonsil:  - Status post chemoradiation, in remission per oncology note.    Anemia:  - No gross bleeding. anemia panel with minimally low iron at 41, but normal ferritin.  Stable.   Euthyroid sick syndrome:  - Would repeat thyroid function tests in a few months.  Hypokalemia:  - Repleted by mouth.  AKI:  - See above. Lasix temporarily held due to worsening renal function. Creatinine has improved slightly. Lasix resumed today. Follow BMP.   Type II DM -  Reasonably controlled. Not on meds currently    DVT prophylaxis: SCDs & Lovenox added 10/7. Code Status:  full Family Communication:  Discussed with spouse at bedside on 10/7 Disposition Plan:  DC home when medically stable-pending improvement of LFTs.   Consultants:   Nephrology   Interventional radiology  Medical oncology.  Procedures:   upsizing of biliary drain by IR on 10/5  Antibiotics:   IV ceftezolin 1 dose on 10/1   IV Zosyn 1 dose on 10/5   Oral Cipro 10/7 >  Oral fluconazole 10/7 >  Subjective: Denied complaints. Eating. Had BM yesterday.  Objective: Filed Vitals:   01/15/15 1839 01/15/15 2148 01/16/15 0456 01/16/15 1027  BP: 105/62 116/59 147/67 107/67  Pulse: 82 84 81 88  Temp: 98 F (36.7 C) 98.8 F (37.1 C) 97.6 F (36.4 C) 98.4 F (36.9 C)  TempSrc: Oral Oral Oral Oral  Resp: 17 16 18  18   Height:      Weight:      SpO2: 100% 98% 99% 99%    Intake/Output Summary (Last 24 hours) at 01/16/15 1528 Last data filed at 01/16/15 1439  Gross per 24 hour  Intake   1760 ml  Output    425 ml  Net   1335 ml   Filed Weights   01/13/15 2042 01/14/15 0500 01/14/15 2036  Weight: 63.3 kg (139 lb 8.8 oz) 59.166 kg (130 lb 7 oz) 60.014 kg (132 lb 4.9 oz)     Exam:  General exam:  Elderly frail male lying comfortably supine in bed. Scleral and skin icterus. Respiratory system: Clear. No increased work of breathing. Cardiovascular system: S1 & S2 heard, RRR. No JVD, murmurs, gallops, clicks or pedal edema. Gastrointestinal system: Abdomen is nondistended, soft and nontender. Normal bowel sounds heard.  Biliary drain + Central nervous system: Alert and oriented. No focal neurological deficits. Extremities: Symmetric 5 x 5 power.   Data Reviewed: Basic Metabolic Panel:  Recent Labs Lab 01/12/15 1440 01/13/15 0431 01/14/15 0510 01/15/15 0500 01/16/15 0535  NA 127* 131* 130* 129* 127*  K 3.4* 4.1 3.6 3.6 3.6  CL 94* 98* 94* 90* 90*  CO2 24 25 26 27 25   GLUCOSE 147* 146* 132* 130* 149*  BUN 23* 26* 31* 35* 35*  CREATININE 1.46* 1.61* 1.68* 1.72* 1.57*  CALCIUM 8.1* 8.2* 8.4* 8.5* 8.5*  PHOS 3.7  --   --   --   --    Liver Function Tests:  Recent Labs Lab 01/12/15 0347 01/12/15 1440 01/13/15 0431 01/14/15 0510 01/15/15 0500 01/16/15 0535  AST 134*  --  121* 119* 115* 149*  ALT 125*  --  110* 107* 107* 114*  ALKPHOS 686*  --  543* 475* 422* 382*  BILITOT 16.6*  --  20.5* 22.2* 19.8* 19.4*  PROT 5.5*  --  5.3* 5.9* 5.6* 5.9*  ALBUMIN 1.9* 1.8* 1.9* 1.9* 1.9* 1.8*   No results for input(s): LIPASE, AMYLASE in the last 168 hours. No results for input(s): AMMONIA in the last 168 hours. CBC:  Recent Labs Lab 01/09/15 2108 01/10/15 0713 01/11/15 0430 01/12/15 0347 01/14/15 0510 01/15/15 0500  WBC 14.7* 13.4* 10.7* 10.9* 11.7* 10.6*  NEUTROABS 13.5*  12.1* 9.3*  --   --   --   HGB 9.0* 8.3* 9.1* 8.9* 8.3* 8.2*  HCT 25.3* 23.7* 26.0* 27.0* 24.4* 23.8*  MCV 78.6 81.4 82.5 83.6 83.8 83.5  PLT 449* 415* 409* 426* 366 345   Cardiac Enzymes: No results for input(s): CKTOTAL, CKMB, CKMBINDEX, TROPONINI in the last 168 hours. BNP (last 3 results) No results for input(s): PROBNP in the last 8760 hours. CBG:  Recent Labs Lab 01/09/15 1610  GLUCAP 147*    Recent Results (from the past 240 hour(s))  Culture, body fluid-bottle     Status: None (Preliminary result)   Collection Time: 01/15/15  1:55 PM  Result Value Ref Range Status   Specimen Description BILE  Final   Special Requests BAA 10MLS  Final   Gram Stain   Final    GRAM NEGATIVE RODS IN BOTH AEROBIC AND ANAEROBIC BOTTLES BUDDING YEAST SEEN IN BOTH AEROBIC AND ANAEROBIC BOTTLES CRITICAL RESULT CALLED TO, READ BACK BY AND VERIFIED WITH: Pollie Meyer RN [id number] 01/15/15 A BROWNING  Culture GRAM NEGATIVE RODS  Final   Report Status PENDING  Incomplete  Gram stain     Status: None   Collection Time: 01/15/15  1:55 PM  Result Value Ref Range Status   Specimen Description BILE  Final   Special Requests NONE  Final   Gram Stain   Final    YEAST Gram Stain Report Called to,Read Back By and Verified With: A [facility] RN [id number] 01/15/15  A BROWNING    Report Status 01/15/2015 FINAL  Final           Studies: Ct Abdomen Pelvis Wo Contrast  01/16/2015   CLINICAL DATA:  Cancer ([place]) C80.1 (ICD-10-CM). Patient originally presented with painless jaundice, weakness and mellitus. Patient had a pancreatic MRI, not currently available for review, demonstrating a pancreatic mass obstructing the common bile duct. Patient with elevated bilirubin as high as 19. Also was a history of left tonsillar squamous cell carcinoma.  EXAM: CT CHEST, ABDOMEN AND PELVIS WITHOUT CONTRAST  TECHNIQUE: Multidetector CT imaging of the chest, abdomen and pelvis was performed following the standard protocol without IV  contrast.  COMPARISON:  PET-CT, 11/11/2013.  Chest CT, 05/06/2014.  FINDINGS: CT CHEST FINDINGS  Thoracic inlet and axilla. There is left axillary adenopathy, largest node measuring 12 mm in short axis. There is intervening inflammatory type stranding. There is increased density in the retroareolar region of the left breast, most likely gynecomastia. Axillary adenopathy was present previously. Apparent gynecomastia is new. No neck base masses or adenopathy.  Mediastinum and hila: Focal bulge from the aortic arch is stable from the prior exam. Heart is normal in size. There are coronary artery calcifications, also stable. Mildly enlarged precarinal lymph node measuring 13 mm, previously subcentimeter. No hilar masses or adenopathy.  Lungs and pleura: 5 mm left lower lobe pulmonary nodule, image 45, series 4, with associated linear opacity. Nodule is new or increased in size. Linear opacity, likely scarring, stable. No other discrete nodules. Small right pleural effusion and minimal left pleural effusion. Mild lung base linear/reticular opacity is noted consistent subsegmental atelectasis. No lung consolidation or edema. There are moderate changes of centrilobular emphysema.  CT ABDOMEN AND PELVIS FINDINGS  Percutaneous biliary stent extends from the upper anterior abdominal wall to the region of the porta hepatis into the common bile duct passing through the ampulla of Vater into the third portion of the duodenum. There is some intrahepatic biliary air. There is prominence of the pancreatic head. Is no defined mass. Abnormal hypo attenuating soft tissue or fluid extends along the gastrohepatic ligament. Mild inflammatory type stranding lies adjacent to the distal stomach and pancreatic head as well as the duodenum and along the anterior para renal fascia. A trace amount of ascites is seen along the anterior perirenal fascia, adjacent to the liver and along the right pericolic gutter extending into the posterior  pelvic recess.  Liver: No mass or focal lesion. Low density consistent with edema extends along the portal branches.  Spleen:  Unremarkable.  Gallbladder: Dense material lies within the gallbladder. No evidence of acute cholecystitis.  Adrenal glands:  No masses.  Kidneys, ureters, bladder: 2.9 cm low-density mass projects medially from the mid to lower pole left kidney, likely a cyst. No other renal masses. No collecting system stones. No hydronephrosis. Ureters normal in course and in caliber. Bladder is unremarkable.  Prostate gland:  Enlarged measuring 5.4 x 4.3 cm transversely.  Lymph nodes: Multiple prominent lymph nodes. These are most evident in the upper abdomen  and in the peritoneal cavity. There is a right lower quadrant mesenteric node measuring 10 mm in short axis. Focal soft tissue nodules anterior to the distal stomach below the left lobe of the liver are noted which may reflect additional lymph nodes, largest measuring 11 mm in short axis. There is more a ill-defined soft tissue nodularity in the teres celiac region extending toward the porta hepatis and pancreatic head likely also adenopathy.  Vascular: Patient has an aortic stent graft excluding and infrarenal abdominal aortic aneurysm. Native aneurysm measures 4.9 x 5.6 cm. No evidence of rupture.  Gastrointestinal: Mild prominence of the colon and loops small bowel several with air-fluid levels. A mild adynamic ileus is suspected. There is no bowel wall thickening. There is no evidence of obstruction. Uninflamed diverticula are noted along the left colon.  Musculoskeletal:  No osteoblastic or osteolytic lesions.  IMPRESSION: 1. Pancreatic head mass is not well-defined on the unenhanced images. There is pancreatic head fullness. Abnormal low attenuation soft tissue extends from the pancreatic head along the porta hepatis, which may reflect edema. Some of this may be infiltrating tumor. There is shotty adenopathy in the upper abdomen along the  periceliac chain, gastrohepatic ligament and in the peripancreatic region. There is additional adenopathy in the peritoneal cavity most prominent in the right mid to lower quadrant. There is also abnormal soft tissue nodules in the anterior upper abdomen just below the left lobe of the liver, which may reflect additional adenopathy or peritoneal/omental carcinoma deposits. Peritoneal spread of carcinoma is suspected. Small amount of ascites. 2. Well-positioned biliary stent, percutaneously placed. No significant intrahepatic bile duct dilation. 3. Mild prominence of the colon small bowel. Mild adynamic ileus is suspected. No bowel obstruction or bowel inflammation. 4. Small right minimal left pleural effusions. 5. 5 mm left lower lobe pulmonary nodule. This could reflect neoplastic disease or be due to scarring. The nodule appears new or increased compared the prior CT. 6. Left axillary adenopathy similar to the prior CT. 7. Increased soft tissue in the retroareolar region of the left breast. This most likely gynecomastia, but is new. Consider follow-up with mammography if this patient has a firm palpable lump in this region. 8. Chronic findings include emphysema, changes from aortic aneurysm surgery and prostatic enlargement.   Electronically Signed   By: Lajean Manes M.D.   On: 01/16/2015 15:19   Ct Chest Wo Contrast  01/16/2015   CLINICAL DATA:  Cancer ([place]) C80.1 (ICD-10-CM). Patient originally presented with painless jaundice, weakness and mellitus. Patient had a pancreatic MRI, not currently available for review, demonstrating a pancreatic mass obstructing the common bile duct. Patient with elevated bilirubin as high as 19. Also was a history of left tonsillar squamous cell carcinoma.  EXAM: CT CHEST, ABDOMEN AND PELVIS WITHOUT CONTRAST  TECHNIQUE: Multidetector CT imaging of the chest, abdomen and pelvis was performed following the standard protocol without IV contrast.  COMPARISON:  PET-CT, 11/11/2013.   Chest CT, 05/06/2014.  FINDINGS: CT CHEST FINDINGS  Thoracic inlet and axilla. There is left axillary adenopathy, largest node measuring 12 mm in short axis. There is intervening inflammatory type stranding. There is increased density in the retroareolar region of the left breast, most likely gynecomastia. Axillary adenopathy was present previously. Apparent gynecomastia is new. No neck base masses or adenopathy.  Mediastinum and hila: Focal bulge from the aortic arch is stable from the prior exam. Heart is normal in size. There are coronary artery calcifications, also stable. Mildly enlarged precarinal lymph node measuring  13 mm, previously subcentimeter. No hilar masses or adenopathy.  Lungs and pleura: 5 mm left lower lobe pulmonary nodule, image 45, series 4, with associated linear opacity. Nodule is new or increased in size. Linear opacity, likely scarring, stable. No other discrete nodules. Small right pleural effusion and minimal left pleural effusion. Mild lung base linear/reticular opacity is noted consistent subsegmental atelectasis. No lung consolidation or edema. There are moderate changes of centrilobular emphysema.  CT ABDOMEN AND PELVIS FINDINGS  Percutaneous biliary stent extends from the upper anterior abdominal wall to the region of the porta hepatis into the common bile duct passing through the ampulla of Vater into the third portion of the duodenum. There is some intrahepatic biliary air. There is prominence of the pancreatic head. Is no defined mass. Abnormal hypo attenuating soft tissue or fluid extends along the gastrohepatic ligament. Mild inflammatory type stranding lies adjacent to the distal stomach and pancreatic head as well as the duodenum and along the anterior para renal fascia. A trace amount of ascites is seen along the anterior perirenal fascia, adjacent to the liver and along the right pericolic gutter extending into the posterior pelvic recess.  Liver: No mass or focal lesion.  Low density consistent with edema extends along the portal branches.  Spleen:  Unremarkable.  Gallbladder: Dense material lies within the gallbladder. No evidence of acute cholecystitis.  Adrenal glands:  No masses.  Kidneys, ureters, bladder: 2.9 cm low-density mass projects medially from the mid to lower pole left kidney, likely a cyst. No other renal masses. No collecting system stones. No hydronephrosis. Ureters normal in course and in caliber. Bladder is unremarkable.  Prostate gland:  Enlarged measuring 5.4 x 4.3 cm transversely.  Lymph nodes: Multiple prominent lymph nodes. These are most evident in the upper abdomen and in the peritoneal cavity. There is a right lower quadrant mesenteric node measuring 10 mm in short axis. Focal soft tissue nodules anterior to the distal stomach below the left lobe of the liver are noted which may reflect additional lymph nodes, largest measuring 11 mm in short axis. There is more a ill-defined soft tissue nodularity in the teres celiac region extending toward the porta hepatis and pancreatic head likely also adenopathy.  Vascular: Patient has an aortic stent graft excluding and infrarenal abdominal aortic aneurysm. Native aneurysm measures 4.9 x 5.6 cm. No evidence of rupture.  Gastrointestinal: Mild prominence of the colon and loops small bowel several with air-fluid levels. A mild adynamic ileus is suspected. There is no bowel wall thickening. There is no evidence of obstruction. Uninflamed diverticula are noted along the left colon.  Musculoskeletal:  No osteoblastic or osteolytic lesions.  IMPRESSION: 1. Pancreatic head mass is not well-defined on the unenhanced images. There is pancreatic head fullness. Abnormal low attenuation soft tissue extends from the pancreatic head along the porta hepatis, which may reflect edema. Some of this may be infiltrating tumor. There is shotty adenopathy in the upper abdomen along the periceliac chain, gastrohepatic ligament and in the  peripancreatic region. There is additional adenopathy in the peritoneal cavity most prominent in the right mid to lower quadrant. There is also abnormal soft tissue nodules in the anterior upper abdomen just below the left lobe of the liver, which may reflect additional adenopathy or peritoneal/omental carcinoma deposits. Peritoneal spread of carcinoma is suspected. Small amount of ascites. 2. Well-positioned biliary stent, percutaneously placed. No significant intrahepatic bile duct dilation. 3. Mild prominence of the colon small bowel. Mild adynamic ileus is suspected. No  bowel obstruction or bowel inflammation. 4. Small right minimal left pleural effusions. 5. 5 mm left lower lobe pulmonary nodule. This could reflect neoplastic disease or be due to scarring. The nodule appears new or increased compared the prior CT. 6. Left axillary adenopathy similar to the prior CT. 7. Increased soft tissue in the retroareolar region of the left breast. This most likely gynecomastia, but is new. Consider follow-up with mammography if this patient has a firm palpable lump in this region. 8. Chronic findings include emphysema, changes from aortic aneurysm surgery and prostatic enlargement.   Electronically Signed   By: Lajean Manes M.D.   On: 01/16/2015 15:19        Scheduled Meds: . ciprofloxacin  500 mg Oral BID  . fluconazole  200 mg Oral Q24H  . furosemide  20 mg Oral Daily  . metoprolol  25 mg Oral BID  . pantoprazole  40 mg Oral Daily  . sodium chloride  3 mL Intravenous Q12H  . sodium chloride  5 mL Intracatheter Q8H   Continuous Infusions:   Principal Problem:   Malignant obstructive jaundice Active Problems:   Dysphagia, oropharyngeal   Acid reflux   Essential (primary) hypertension   Hypercholesteremia   Cancer of tonsil (HCC)   Pancreatic mass   SIADH (syndrome of inappropriate ADH production) (HCC)   Anemia   Hyperbilirubinemia   Euthyroid sick syndrome   AKI (acute kidney injury)  ([place])   Biliary obstruction   Pancreatic cancer ([place])    Time spent: 20 minutes.    Vernell Leep, MD, FACP, FHM. [facility] Pager [phone]  If 7PM-7AM, please contact night-coverage [email or web address] Password [email or web address] 01/16/2015, 3:28 PM    LOS: 7 days

## 2015-01-16 NOTE — Consult Note (Signed)
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 Denies fevers, chills, night sweats, vision changes, or mucositis. Denies any respiratory complaints. Denies any chest pain or palpitations. Denies lower extremity swelling. Denies nausea or heartburn. His appetite is decreased. Denies any dysuria. Denies abnormal skin rashes, or neuropathy. Denies any bleeding issues such as epistaxis, hematemesis, hematuria or hematochezia.    MRI pancreas was remarkable for a mass in the pancreatic area which was compressing the common bile duct. The patient's bilirubin was also noted to be high at 19. At [place] attempted ERCP was performed, however, secondary to post surgical anatomy the patient was unable to be intubated and was then transferred to [facility] for percutaneous biliary drain placement, performed on 10/1 by IR, with exchange of biliary drain on 10/5. Cholangiogram with brush biopsy on 10/1 was positive for adenocarcinoma. CA 19.9 is 418.  Risk factors include a history of remote alcohol abuse. He denies any family history of pancreas cancer. He denies any history of diabetes.   Suspecting  pancreatic cancer primary, Oncology was notified of the patient's admission for recommendations.  Oncology History   81 year old male referred by [facility]  with newly diagnosed left tonsillar squamous cell cancer.patient had presented to Dr. Rosanna Randy for left-sided throat pain and his wife had noted some mild swelling in his neck.  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  Had been previously treated with oral antibiotics .remote history of smoking that he quit in 1999. Patient underwent fiberoptic laryngoscopy on 05/09/2013 with findings of left posterior displacement fullness inferior aspect of the left nasopharynx with findings highly suspicious for tonsillar squamous cell carcinoma. Dr. Carlis Abbott noted  tonsillar mass is 3 x 3 cm also noted a discrete left neck nodule 1.5 x 2 and centimeters, was not able to be removed due to extension beyond the tonsillar fossa     Cancer of tonsil (SCC) Pancreatic Mass    05/09/13 Initial diagnosis stage IV B.(T2, N3, M0) squamous cell carcinoma the left tonsillarpillar area   08/03/13 chemoradiation  completion of concurrent chemotherapy and radiation therapy      PET scan Abnormal left axillary lymph node noted   11/28/13 Left axillary lymph node biopsy Positive atypical squamous cells   12/30/13 Left axillary lymph node excision Small seroma. No mets found   04/2014 CT head and neck Negative for recurrence   01/08/15 MRI pancreas MRI scan shows large mass arising from pancreas and this is  unresectable based on portal venous involvement as well as celiac axis involvement   9[facility] Patient admitted with obstructive jaundice due to pancreatic mass, now transferred to [facility] on 10/1 for continuation of care   10/1 Bile duct brushing Bile duct brushing is positive for adenocarcinoma    Past Medical History  Diagnosis Date   Hyperlipidemia    Cancer [facility]) Feb 2015    tonsil, chemo/radiation    Hypertension    Diabetes mellitus without complication ([place])    CAD (coronary artery disease)    Vitamin D deficiency    AAA (abdominal aortic aneurysm) (HCC)      MEDICATIONS:  Scheduled Meds:  fluconazole  200 mg Oral Q24H   furosemide  20 mg Oral Daily   iohexol  25 mL Oral Q1 Hr x 2   metoprolol  25 mg Oral BID   pantoprazole  40 mg Oral Daily   sodium chloride  3 mL Intravenous Q12H   sodium chloride  5 mL Intracatheter Q8H   Continuous Infusions:  PRN Meds:.diphenhydrAMINE, HYDROcodone-acetaminophen, iohexol, iohexol, ondansetron **OR** [DISCONTINUED] ondansetron (ZOFRAN) IV, simethicone, sodium chloride, sodium chloride  ALLERGIES: No Known Allergies  Review of systems:   Constitutional: Denies fevers, chills or abnormal night sweats. He has been more fatigued.  Eyes: Denies blurriness of vision, double vision or watery eyes Ears, nose, mouth, throat, and face: Denies mucositis or sore throat Respiratory: Denies cough, dyspnea or wheezes Cardiovascular: Denies palpitation, chest discomfort or lower extremity swelling Gastrointestinal:  Denies nausea, heartburn. He did noticed lighter colored stools. Appetite decreased, lost about 5 lbs in 6 months GU: remarkable for darker urine Skin: Denies abnormal skin rashes. He does report pruritus Lymphatics: Denies new lymphadenopathy or easy bruising Neurological: Denies numbness, tingling or new weaknesses Behavioral/Psych: Mood is stable, no new changes  All other systems were reviewed with the patient and are negative.    Family History  Problem Relation Age of Onset   Diabetes Mother    Heart disease Mother    Stroke Sister    Kidney disease Sister     kidney failure   Hypertension Brother      Past Surgical History  Procedure Laterality Date   Portacath placement  Fe 2015    [name]   Axillary lymph node biopsy Left 11-28-13    atyical squamous cells   Carotid stent  1999   Hernia  repair Bilateral 10-06-00    inguinal/ Dr. [name]   Lymph node dissection Left 12-30-13    axilla   Endoscopic retrograde cholangiopancreatography (ercp) with propofol N/A 01/08/2015    Procedure: ENDOSCOPIC RETROGRADE CHOLANGIOPANCREATOGRAPHY (ERCP) WITH PROPOFOL;  Surgeon: Lucilla Lame, MD;  Location: [facility] ENDOSCOPY;  Service: Endoscopy;  Laterality: N/A;    Social History   Social History   Marital Status: Married    Spouse Name: N/A   Number of Children: N/A   Years of Education: N/A   Occupational History   Not on file.   Social History Main Topics   Smoking status: Former Smoker -- 1.00 packs/day for 15 years    Types: Cigarettes    Quit date: 04/11/1997   Smokeless tobacco: Never Used   Alcohol Use: No   Drug Use: No   Sexual Activity: Not on file   Other Topics Concern   Not on file   Social History Narrative     PHYSICAL EXAMINATION:  Filed Vitals:   01/16/15 1027  BP: 107/67  Pulse: 88  Temp: 98.4  F (36.9 C)  Resp: 18      Intake/Output Summary (Last 24 hours) at 01/16/15 1119 Last data filed at 01/16/15 1005  Gross per 24 hour  Intake   2000 ml  Output    275 ml  Net   1725 ml  ECOG PS 2 GENERAL:alert, no distress and comfortable SKIN: skin color is jaundiced, texture, turgor are normal, no rashes or significant lesions EYES: normal, conjunctiva are jaundiced and non-injected, sclera clear OROPHARYNX:no exudate, no erythema and lips, buccal mucosa, and tongue normal  NECK: supple, thyroid normal size, non-tender, without nodularity LYMPH:  He has clinical palpable lymphadenopathy in the left cervical region and left axilla.  LUNGS: clear to auscultation and percussion with normal breathing effort HEART: regular rate & rhythm and no murmurs and no lower extremity edema ABDOMEN: soft, non-tender and normal bowel sounds. Biliary drain in situ Musculoskeletal:no cyanosis of digits and no clubbing  PSYCH: alert & oriented x 3 with fluent  speech NEURO: no focal motor/sensory deficits  LABORATORY/RADIOLOGY DATA:   Recent Labs Lab 01/09/15 2108 01/10/15 0713 01/11/15 0430 01/12/15 0347 01/14/15 0510 01/15/15 0500  WBC 14.7* 13.4* 10.7* 10.9* 11.7* 10.6*  HGB 9.0* 8.3* 9.1* 8.9* 8.3* 8.2*  HCT 25.3* 23.7* 26.0* 27.0* 24.4* 23.8*  PLT 449* 415* 409* 426* 366 345  MCV 78.6 81.4 82.5 83.6 83.8 83.5  MCH 28.0 28.5 28.9 27.6 28.5 28.8  MCHC 35.6 35.0 35.0 33.0 34.0 34.5  RDW 17.1* 17.1* 17.7* 17.7* 18.4* 19.1*  LYMPHSABS 0.5* 0.3* 0.6*  --   --   --   MONOABS 0.7 1.1* 0.9  --   --   --   EOSABS 0.0 0.0 0.1  --   --   --   BASOSABS 0.0 0.0 0.0  --   --   --     CMP    Recent Labs Lab 01/12/15 0347  01/12/15 1440 01/13/15 0431 01/14/15 0510 01/15/15 0500 01/16/15 0535  NA 127*  < > 127* 131* 130* 129* 127*  K 3.4*  < > 3.4* 4.1 3.6 3.6 3.6  CL 93*  < > 94* 98* 94* 90* 90*  CO2 24  < > 24 25 26 27 25   GLUCOSE 120*  < > 147* 146* 132* 130* 149*  BUN 22*  < > 23* 26* 31* 35* 35*  CREATININE 1.37*  < > 1.46* 1.61* 1.68* 1.72* 1.57*  CALCIUM 8.2*  < > 8.1* 8.2* 8.4* 8.5* 8.5*  AST 134*  --   --  121* 119* 115* 149*  ALT 125*  --   --  110* 107* 107* 114*  ALKPHOS 686*  --   --  543* 475* 422* 382*  BILITOT 16.6*  --   --  20.5* 22.2* 19.8* 19.4*  < > = values in this interval not displayed.      Component Value Date/Time   BILITOT 19.4* 01/16/2015 0535   BILITOT 18.4* 01/06/2015 1033   BILITOT 1.0 08/06/2014 1015   BILIDIR 12.5* 01/10/2015 0713   IBILI 6.6* 01/10/2015 0713    Recent Labs Lab 01/09/15 1417 01/09/15 2108  INR 1.08 1.19     Liver Function Tests:  Recent Labs Lab 01/12/15 0347 01/12/15 1440 01/13/15 0431 01/14/15 0510 01/15/15 0500 01/16/15 0535  AST 134*  --  121* 119* 115* 149*  ALT 125*  --  110* 107* 107* 114*  ALKPHOS 686*  --  543* 475* 422* 382*  BILITOT 16.6*  --  20.5* 22.2*  19.8* 19.4*  PROT 5.5*  --  5.3* 5.9* 5.6* 5.9*  ALBUMIN 1.9* 1.8* 1.9* 1.9* 1.9*  1.8*   CBG:  Recent Labs Lab 01/09/15 1610  GLUCAP 147*    Patient: Geoffrey Gregory, Geoffrey Gregory Collected: 01/10/2015 Client: [place] Accession: [id number] Received: 01/12/2015 D. Arne Cleveland DOB: 1932-09-02 Age: 53 Gender: M Reported: 01/13/2015 [address] Patient Ph: [phone] MRN#: [id number] [place], [place] [address] Client Acc#: Chart: Phone:  Fax: LMP: Visit#: [id number] CC: Doree Barthel CYTOPATHOLOGY REPORT Adequacy Reason Satisfactory For Evaluation. Diagnosis BILE DUCT BRUSHING, COMMON (SPECIMEN 1 OF 1, COLLECTED ON 01/10/15): ADENOCARCINOMA  Radiology Studies:  [name] W/wo Cm/mrcp  01/07/2015    FINDINGS: Lower chest: Small bilateral pleural effusions are identified. New from previous study.  Hepatobiliary: No focal liver lesions identified. Marked intrahepatic bile duct dilatation. The extrahepatic common bile duct measured 1 cm. Abrupt cut off of the common bile duct is identified.  Pancreas: There is an ill defined mass arising from the head of pancreas which measures approximately 4.1 x 3.6 x 4.6 cm. The mass completely encases the portal venous confluence with significant diminished caliber of the portal vein. Branches of the celiac trunk appear encased by this mass. The SMA appears patent an unaffected.  Spleen: Normal appearance of the spleen.  Adrenals/Urinary Tract: The adrenal glands are unremarkable. Bilateral renal cysts noted.  Stomach/Bowel: The stomach and the upper abdominal bowel loops are unremarkable.  Vascular/Lymphatic: Status post repair of infrarenal abdominal aortic aneurysm. The aneurysm sac measures 4.6 cm, image 7 of series 13. Previously 5.5 cm. No retroperitoneal adenopathy identified.  Other: There is a small amount of ascites identified within the upper abdomen.  Musculoskeletal: No specific findings to suggest bone metastasis.  IMPRESSION: 1. Large mass arising from the head of pancreas is identified worrisome for primary  pancreatic neoplasm. 2. There is complete obstruction of the common bile duct. The portal venous confluence is completely encased by the lesion. There is also involvement of a branches of the celiac trunk. 3. No specific findings identified to suggest liver metastasis.   Electronically Signed   By: Kerby Moors M.D.   On: 01/07/2015 17:53   Ir Int Lianne Cure Biliary Drain With Cholangiogram  01/10/2015    FINDINGS: The initial percutaneous transhepatic cholangiogram demonstrates marked intrahepatic biliary ductal dilatation. The proximal common duct just beyond the biliary confluence is patent. There is a long irregular stricture of the mid and distal CBD, with associated mucosal irregularity. The duodenum is decompressed.  Percutaneous biliary brush biopsy of the CBD lesion was performed x2.  10 French internal external biliary drain catheter was placed to the duodenum without complication.  IMPRESSION: 1. Percutaneous transhepatic cholangiogram demonstrating marked intrahepatic biliary ductal dilatation, long segment CBD irregularity and narrowing. 2. Technically successful brush biopsy of CBD lesion. 3. Technically successful internal external biliary drain catheter placement. The catheter was left to external drainage to maximize biliary decompression.   Electronically Signed   By: Lucrezia Europe M.D.   On: 01/10/2015 13:00   Ir Exchange Biliary Drain  01/14/2015    FINDINGS: The left internal/external biliary drain was stable in position from the prior examination. The intrahepatic ducts are decompressed and appears to be draining both the left and right ducts. The distal aspect of the biliary drain was patent but there was sluggish flow and appeared to be partial occlusion. New catheter was positioned in the biliary system with the tip in the duodenum. New catheter is widely patent  following contrast injection.  Estimated blood loss: None  CONTRAST:  10 mL Omnipaque 235  COMPLICATIONS: None  IMPRESSION: The  biliary drain was patent but there was sluggish flow through the distal component. Therefore, the biliary drain was exchanged. Continue to follow liver enzymes.   Electronically Signed   By: Markus Daft M.D.   On: 01/14/2015 10:34   Ir Endoluminal Bx Of Biliary Tree  01/10/2015   FINDINGS: The initial percutaneous transhepatic cholangiogram demonstrates marked intrahepatic biliary ductal dilatation. The proximal common duct just beyond the biliary confluence is patent. There is a long irregular stricture of the mid and distal CBD, with associated mucosal irregularity. The duodenum is decompressed.  Percutaneous biliary brush biopsy of the CBD lesion was performed x2.  10 French internal external biliary drain catheter was placed to the duodenum without complication.  IMPRESSION: 1. Percutaneous transhepatic cholangiogram demonstrating marked intrahepatic biliary ductal dilatation, long segment CBD irregularity and narrowing. 2. Technically successful brush biopsy of CBD lesion. 3. Technically successful internal external biliary drain catheter placement. The catheter was left to external drainage to maximize biliary decompression.   Electronically Signed   By: Lucrezia Europe M.D.   On: 01/10/2015 13:00   US Abdomen Limited Ruq   01/07/2015 FINDINGS: Gallbladder:  The gallbladder is adequately distended. There is echogenic bile versus tiny floating stones within the gallbladder lumen. There is no gallbladder wall thickening, pericholecystic fluid, or positive sonographic Murphy's sign.  Common bile duct:  Diameter: 10 mm  Liver:  The intrahepatic ducts are dilated. No discrete hepatic mass is observed. There is a hypoechoic mass associated with the inferior aspect of the pancreatic head. There is ascites.  IMPRESSION: Suspicious masslike lesion in the region of the pancreatic head resulting in biliary ductal dilation. Possible tiny gallstones versus echogenic bile.  Abdominal MRI or CT scanning is recommended.    Electronically Signed   By: David  Martinique M.D.   On: 01/07/2015 14:03    ASSESSMENT AND PLAN:  Left tonsillar squamous cell cancer S/p chemoradiation, completed on 08/03/13 We'll attempt to get outside records pertaining to the type of treatment he had received. He had palpable lymphadenopathy in the left side of the neck and left axilla. Residual disease is possible. Currently asymptomatic.  Adenocarcinoma of pancreas primary  He presented with obstructive painless jaundice  MRI pancreas was remarkable for a mass in the pancreatic area which was compressing the common bile duct.  The patient's bilirubin was also noted to be high at 19.  At [place] attempted ERCP was performed, however, secondary to post surgical anatomy the patient was unable to be intubated and was then transferred to [facility] for percutaneous biliary drain placement, performed on 10/1 by IR, with exchange of biliary drain on 10/5.  Cholangiogram with brush biopsy on 10/5 was positive for adenocarcinoma.  CA 19.9 is 418. Staging CTs of the chest, abdomen and pelvis show possible metastatic disease within his abdomen With the high bilirubin, it is not possible to offer any formal systemic treatment. I recommend consulting GI for ERCP. 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  The slow improvements in is likely just hemodilution from IV fluids. I would expect bilirubin  should come down by half with biliary drain placement also week ago. Without significant improvement of LFT, no systemic treatment can be offered. I recommend GI consult for possible ERCP and stent placement internally. If this is not possible, I recommend referral for hospice care  Malnutrition Consider Nutrition evaluation  DVT prophylaxis On SCDs  Full Code We discussed about advanced directives. Currently, he is on full CODE STATUS. As I have fully discussed with the patient and his wife, his prognosis is poor if we cannot resolve the significant obstructive jaundice. I have discussed this with the hospitalist to arrange for GI consult and possibly ENT consult. If all services felt that ERCP or further relief of obstructive jaundice is not possible, then I would recommend palliative care referral and discussion about hospice  Other medical issues as per admitting team  Will return next week for further discussion.  Rondel Jumbo, PA-C 01/16/2015, 11:19 AM  Nidal Rivet, MD 01/16/2015

## 2015-01-16 NOTE — Progress Notes (Signed)
Pharmacy Antibiotic Time-Out Note  Geoffrey Gregory is a 81 y.o. year-old male admitted on 01/09/2015.  The patient is currently on Fluconazole for biliary infection.  Assessment/Plan: After discussion with Dr. Algis Liming, the patient will be continued on Fluconazole for yeast in the biliary drainage but will add Ciprofloxacin for empiric coverage of GNR while awaiting speciation and sensitivities. No stop date has been set at this time.   Plan 1. Continue Fluconazole 200 mg po every 24 hours 2. Start Cipro 500 mg po twice daily 3. Will continue to follow renal function, culture results, LOT, and antibiotic de-escalation plans    Recent Labs Lab 01/10/15 0713 01/11/15 0430 01/12/15 0347 01/14/15 0510 01/15/15 0500  WBC 13.4* 10.7* 10.9* 11.7* 10.6*    Recent Labs Lab 01/12/15 1440 01/13/15 0431 01/14/15 0510 01/15/15 0500 01/16/15 0535  CREATININE 1.46* 1.61* 1.68* 1.72* 1.57*   Estimated Creatinine Clearance: 32.4 mL/min (by C-G formula based on Cr of 1.57).   Tmax/24h: Afebrile  Antimicrobial allergies: NKA  Antimicrobials this admission: Fluconazole 10/6 >> Cipro 10/7 >>  Levels/dose changes this admission: n/a  Microbiology Results: 10/6 Bile >> yeast + GNR   Thank you for allowing pharmacy to be a part of this patient's care.  Lawson Radar PharmD 01/16/2015 3:06 PM

## 2015-01-16 NOTE — Progress Notes (Signed)
Referring Physician(s): GI/TRH  Chief Complaint: Biliary obstruction secondary to adenocarcinoma s/p biliary drain 10/1  Subjective: Patient doing well eating breakfast, denies any significant pain.   Allergies: Review of patient's allergies indicates no known allergies.  Medications: Prior to Admission medications   Medication Sig Start Date End Date Taking? Authorizing Provider  acetaminophen (TYLENOL) 500 MG tablet Take 500 mg by mouth every 6 (six) hours as needed.   Yes Historical Provider, MD  aspirin 81 MG tablet Take by mouth daily. 06/08/11  Yes Historical Provider, MD  cholecalciferol (VITAMIN D) 1000 UNITS tablet Take 1,000 Units by mouth daily.   Yes Historical Provider, MD  lovastatin (MEVACOR) 40 MG tablet Take 40 mg by mouth at bedtime.   Yes Historical Provider, MD  metoprolol (LOPRESSOR) 50 MG tablet Take 1 tablet (50 mg total) by mouth 2 (two) times daily. 10/17/14  Yes [name], MD  omeprazole (PRILOSEC) 40 MG capsule Take 40 mg by mouth daily.  12/11/13  Yes Historical Provider, MD    Vital Signs: BP 147/67 mmHg  Pulse 81  Temp(Src) 97.6 F (36.4 C) (Oral)  Resp 18  Ht $R'5\' 7"'NE$  (1.702 m)  Wt 132 lb 4.9 oz (60.014 kg)  BMI 20.72 kg/m2  SpO2 99%  Physical Exam General: A&Ox3, NAD, sitting up eating breakfast Abd: Soft, Distended, Biliary drain intact 5 cc thick bilious output in bag, tender at site, 275 cc/24 hrs  Imaging: Ir Exchange Biliary Drain  01/14/2015   CLINICAL DATA:  81 year old with pancreatic cancer and biliary obstruction. The patient has an internal/external biliary drain. Despite drain placement, the bilirubin has been increasing. Cholangiogram scheduled for evaluation of the tube and biliary system.  EXAM: CHOLANGIOGRAM THROUGH EXISTING CATHETER ; EXCHANGE OF PERCUTANEOUS BILIARY CATHETER WITH FLUOROSCOPY  Physician: Stephan Minister. Henn, MD  FLUOROSCOPY TIME:  1 minutes and 24 seconds, 36.6 mGy  MEDICATIONS: None  ANESTHESIA/SEDATION:  Moderate sedation time: None  PROCEDURE: The procedure was explained to the patient. The risks and benefits of the procedure were discussed and the patient's questions were addressed. Informed consent was obtained from the patient. The patient was placed supine on the interventional table. Contrast was injected through the existing catheter.  The catheter and surrounding skin were prepped and draped in sterile fashion. Maximal barrier sterile technique was utilized including caps, mask, sterile gowns, sterile gloves, sterile drape, hand hygiene and skin antiseptic. The retention suture was removed. The catheter was cut and removed over a Bentson wire. A new 10 [place] biliary drain was advanced over the wire and the tip was positioned in the duodenum. Small amount of contrast was injected to confirm placement. Catheter was flushed with saline. A dressing was placed over the catheter site.  FINDINGS: The left internal/external biliary drain was stable in position from the prior examination. The intrahepatic ducts are decompressed and appears to be draining both the left and right ducts. The distal aspect of the biliary drain was patent but there was sluggish flow and appeared to be partial occlusion. New catheter was positioned in the biliary system with the tip in the duodenum. New catheter is widely patent following contrast injection.  Estimated blood loss: None  CONTRAST:  10 mL Omnipaque [id number]  COMPLICATIONS: None  IMPRESSION: The biliary drain was patent but there was sluggish flow through the distal component. Therefore, the biliary drain was exchanged. Continue to follow liver enzymes.   Electronically Signed   By: Markus Daft M.D.   On: 01/14/2015 10:34  Labs:  CBC:  Recent Labs  01/11/15 0430 01/12/15 0347 01/14/15 0510 01/15/15 0500  WBC 10.7* 10.9* 11.7* 10.6*  HGB 9.1* 8.9* 8.3* 8.2*  HCT 26.0* 27.0* 24.4* 23.8*  PLT 409* 426* 366 345    COAGS:  Recent Labs  03/13/14 0425  01/09/15 1417 01/09/15 2108  INR 1.1 1.08 1.19  APTT 27.7 25  --     BMP:  Recent Labs  01/13/15 0431 01/14/15 0510 01/15/15 0500 01/16/15 0535  NA 131* 130* 129* 127*  K 4.1 3.6 3.6 3.6  CL 98* 94* 90* 90*  CO2 _0 GLUCOSE 146* 132* 130* 149*  BUN 26* 31* 35* 35*  CALCIUM 8.2* 8.4* 8.5* 8.5*  CREATININE 1.61* 1.68* 1.72* 1.57*  GFRNONAA 39* 37* 36* 40*  GFRAA 45* 43* 42* 47*    LIVER FUNCTION TESTS:  Recent Labs  01/13/15 0431 01/14/15 0510 01/15/15 0500 01/16/15 0535  BILITOT 20.5* 22.2* 19.8* 19.4*  AST 121* 119* 115* 149*  ALT 110* 107* 107* 114*  ALKPHOS 543* 475* 422* 382*  PROT 5.3* 5.9* 5.6* 5.9*  ALBUMIN 1.9* 1.9* 1.9* 1.8*    Assessment and Plan: Biliary obstruction secondary to adenocarcinoma s/p PTC with internal/external biliary drain 10/1 S/p cholangiogram and exchange of biliary drain 10/5, T. Bili 19.4 (19.8), LFT's trending up slightly-follow labs Follow up with [name]   Signed: Tsosie Billing D 01/16/2015, 10:02 AM   I spent a total of 15 Minutes at the the patient's bedside AND on the patient's hospital floor or unit, greater than 50% of which was counseling/coordinating care for biliary obstruction

## 2015-01-17 LAB — CBC
HCT: 22.2 % — ABNORMAL LOW (ref 39.0–52.0)
Hemoglobin: 7.7 g/dL — ABNORMAL LOW (ref 13.0–17.0)
MCH: 29.1 pg (ref 26.0–34.0)
MCHC: 34.7 g/dL (ref 30.0–36.0)
MCV: 83.8 fL (ref 78.0–100.0)
PLATELETS: 340 10*3/uL (ref 150–400)
RBC: 2.65 MIL/uL — ABNORMAL LOW (ref 4.22–5.81)
RDW: 19.6 % — AB (ref 11.5–15.5)
WBC: 9.9 10*3/uL (ref 4.0–10.5)

## 2015-01-17 LAB — COMPREHENSIVE METABOLIC PANEL
ALT: 129 U/L — ABNORMAL HIGH (ref 17–63)
AST: 189 U/L — AB (ref 15–41)
Albumin: 1.6 g/dL — ABNORMAL LOW (ref 3.5–5.0)
Alkaline Phosphatase: 376 U/L — ABNORMAL HIGH (ref 38–126)
Anion gap: 9 (ref 5–15)
BILIRUBIN TOTAL: 19.3 mg/dL — AB (ref 0.3–1.2)
BUN: 27 mg/dL — AB (ref 6–20)
CO2: 27 mmol/L (ref 22–32)
CREATININE: 1.37 mg/dL — AB (ref 0.61–1.24)
Calcium: 8.7 mg/dL — ABNORMAL LOW (ref 8.9–10.3)
Chloride: 94 mmol/L — ABNORMAL LOW (ref 101–111)
GFR calc Af Amer: 55 mL/min — ABNORMAL LOW (ref >60)
GFR, EST NON AFRICAN AMERICAN: 47 mL/min — AB (ref >60)
Glucose, Bld: 123 mg/dL — ABNORMAL HIGH (ref 65–99)
POTASSIUM: 3.9 mmol/L (ref 3.5–5.1)
Sodium: 130 mmol/L — ABNORMAL LOW (ref 135–145)
TOTAL PROTEIN: 5.5 g/dL — AB (ref 6.5–8.1)

## 2015-01-17 NOTE — Consult Note (Signed)
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 He was admitted to [facility] recently with obstructive jaundice. He was found to have a large pancreatic mass with obstruction of the common bile duct. An attempted ERCP was done by Dr Allen Norris, but from the verbal reports this could not be accomplished because he could not be intubated. In talking to the family the doctor came out after the attempted ERCP stating that they could not get both tubes down. I am unclear as to whether or not he could not be intubated by anesthesia and the ERCP scope placed or whether he could not be intubated by anesthesia, or whether or not the ERCP scope by itself could not be placed. I do not see any type of procedural note to explain what happened. He was transferred to this hospital where he had interventional radiology place a percutaneous internal/external biliary drain. This is draining bile. Brushings showed adenocarcinoma, and CA 19- 9 is elevated. He was seen in consultation by oncology but because of a persistently high bilirubin and a could not institute chemotherapy at this time. They asked for our GI opinion as to whether or not we could do an ERCP with biliary stent and better drainage.    Past Medical History  Diagnosis Date  . Hyperlipidemia   . Cancer [facility]) Feb 2015    tonsil, chemo/radiation  . Hypertension   . Diabetes mellitus without complication ([place])   . CAD (coronary artery disease)   . Vitamin D deficiency   . AAA (abdominal aortic aneurysm) [facility])    Past Surgical History  Procedure Laterality Date  . Portacath placement  Fe 2015    [name]  . Axillary lymph node biopsy Left 11-28-13    atyical squamous cells  . Carotid stent  1999  . Hernia repair Bilateral 10-06-00    inguinal/ Dr. [name]  . Lymph node dissection Left 12-30-13    axilla  . Endoscopic retrograde  cholangiopancreatography (ercp) with propofol N/A 01/08/2015    Procedure: ENDOSCOPIC RETROGRADE CHOLANGIOPANCREATOGRAPHY (ERCP) WITH PROPOFOL;  Surgeon: Lucilla Lame, MD;  Location: [facility] ENDOSCOPY;  Service: Endoscopy;  Laterality: N/A;   Social History   Social History  . Marital Status: Married    Spouse Name: N/A  . Number of Children: N/A  . Years of Education: N/A   Occupational History  . Not on file.   Social History Main Topics  . Smoking status: Former Smoker -- 1.00 packs/day for 15 years    Types: Cigarettes    Quit date: 04/11/1997  . Smokeless tobacco: Never Used  . Alcohol Use: No  . Drug Use: No  . Sexual Activity: Not on file   Other Topics Concern  . Not on file   Social History Narrative   family history includes Diabetes in his mother; Heart disease in his mother; Hypertension in his brother; Kidney disease in his sister; Stroke in his sister.  Current facility-administered medications:  .  ciprofloxacin (CIPRO) tablet 500 mg, 500 mg, Oral, BID, Rolla Flatten, RPH, 500 mg at 01/17/15 0805 .  diphenhydrAMINE (BENADRYL) capsule 25 mg, 25 mg, Oral, Q4H PRN, Rhetta Mura Schorr, NP .  enoxaparin (LOVENOX) injection 40 mg, 40 mg, Subcutaneous, Q24H, Modena Jansky, MD, 40 mg at 01/16/15 1609 .  fluconazole (DIFLUCAN) tablet 200 mg, 200 mg, Oral, Q24H, Rebecka Apley, RPH, 200 mg at  01/16/15 1608 .  furosemide (LASIX) tablet 20 mg, 20 mg, Oral, Daily, Modena Jansky, MD, 20 mg at 01/17/15 0953 .  HYDROcodone-acetaminophen (NORCO/VICODIN) 5-325 MG per tablet 1-2 tablet, 1-2 tablet, Oral, Q4H PRN, Arne Cleveland, MD, 1 tablet at 01/17/15 0224 .  iohexol (OMNIPAQUE) 300 MG/ML solution 50 mL, 50 mL, Other, Once PRN, Medication Radiologist, MD, 20 mL at 01/10/15 1206 .  iohexol (OMNIPAQUE) 300 MG/ML solution 50 mL, 50 mL, Intravenous, Once PRN, Medication Radiologist, MD, 10 mL at 01/14/15 0944 .  metoprolol tartrate (LOPRESSOR) tablet 25 mg, 25 mg, Oral, BID,  Mauricia Area, MD, 25 mg at 01/17/15 0953 .  ondansetron (ZOFRAN) tablet 4 mg, 4 mg, Oral, Q6H PRN **OR** [DISCONTINUED] ondansetron (ZOFRAN) injection 4 mg, 4 mg, Intravenous, Q6H PRN, Lavina Hamman, MD .  pantoprazole (PROTONIX) EC tablet 40 mg, 40 mg, Oral, Daily, Lavina Hamman, MD, 40 mg at 01/17/15 0952 .  simethicone (MYLICON) chewable tablet 160 mg, 160 mg, Oral, QID PRN, Rhetta Mura Schorr, NP, 160 mg at 01/17/15 0224 .  sodium chloride (OCEAN) 0.65 % nasal spray 1 spray, 1 spray, Each Nare, PRN, Rhetta Mura Schorr, NP, 1 spray at 01/13/15 0059 .  sodium chloride 0.9 % injection 10-40 mL, 10-40 mL, Intracatheter, PRN, Caren Griffins, MD, 10 mL at 01/17/15 0519 .  sodium chloride 0.9 % injection 3 mL, 3 mL, Intravenous, Q12H, Lavina Hamman, MD, 3 mL at 01/11/15 2230 .  sodium chloride 0.9 % injection 5 mL, 5 mL, Intracatheter, Q8H, Arne Cleveland, MD, 5 mL at 01/17/15 1448  Facility-Administered Medications Ordered in Other Encounters:  .  sodium chloride 0.9 % injection 10 mL, 10 mL, Intravenous, PRN, Evlyn Kanner, NP, 10 mL at 12/05/14 1103 No Known Allergies   Objective:     BP 115/64 mmHg  Pulse 87  Temp(Src) 97.9 F (36.6 C) (Oral)  Resp 17  Ht 5\' 7"  (1.702 m)  Wt 59.58 kg (131 lb 5.6 oz)  BMI 20.57 kg/m2  SpO2 99%  Jaundiced  Heart regular rhythm  Lungs clear  Abdomen soft and nontender  Biliary drain in place  Laboratory No components found for: D1    Assessment:     Obstructive jaundice secondary to pancreatic carcinoma.      Plan:     An internal and external biliary drain has been placed by interventional radiology. The question posed to [place] is whether or not we can do an ERCP. This is difficult to answer at the present time because we do not know the details of why the ERCP could not be accomplished. I do not know if the endoscope could not be passed, or whether he could not be intubated by anesthesia or any of these specific details. I think  that on Monday when everybody is back we can contact Dr. Allen Norris and find out more about the details. In the meanwhile he does have a biliary internal/external drain in place.

## 2015-01-17 NOTE — Progress Notes (Signed)
Patient ID: Geoffrey Gregory, male   DOB: [birth year], 81 y.o.   MRN: [id number]    Referring Physician(s): GI/TRH  Chief Complaint: Biliary obstruction secondary to adenocarcinoma s/p biliary drain 10/1   Subjective: Pt without new c/o   Allergies: Review of patient's allergies indicates no known allergies.  Medications: Prior to Admission medications   Medication Sig Start Date End Date Taking? Authorizing Provider  acetaminophen (TYLENOL) 500 MG tablet Take 500 mg by mouth every 6 (six) hours as needed.   Yes Historical Provider, MD  aspirin 81 MG tablet Take by mouth daily. 06/08/11  Yes Historical Provider, MD  cholecalciferol (VITAMIN D) 1000 UNITS tablet Take 1,000 Units by mouth daily.   Yes Historical Provider, MD  lovastatin (MEVACOR) 40 MG tablet Take 40 mg by mouth at bedtime.   Yes Historical Provider, MD  metoprolol (LOPRESSOR) 50 MG tablet Take 1 tablet (50 mg total) by mouth 2 (two) times daily. 10/17/14  Yes [name], MD  omeprazole (PRILOSEC) 40 MG capsule Take 40 mg by mouth daily.  12/11/13  Yes Historical Provider, MD     Vital Signs: BP 115/64 mmHg  Pulse 87  Temp(Src) 97.9 F (36.6 C) (Oral)  Resp 17  Ht $R'5\' 7"'Hi$  (1.702 m)  Wt 131 lb 5.6 oz (59.58 kg)  BMI 20.57 kg/m2  SpO2 99%  Physical Exam awake/alert; biliary drain intact, insertion site ok, NT, output 245 cc, abd soft, bile cx's- enterobacter, yeast  Imaging: Ct Abdomen Pelvis Wo Contrast  01/16/2015   CLINICAL DATA:  Cancer ([place]) C80.1 (ICD-10-CM). Patient originally presented with painless jaundice, weakness and mellitus. Patient had a pancreatic MRI, not currently available for review, demonstrating a pancreatic mass obstructing the common bile duct. Patient with elevated bilirubin as high as 19. Also was a history of left tonsillar squamous cell carcinoma.  EXAM: CT CHEST, ABDOMEN AND PELVIS WITHOUT CONTRAST  TECHNIQUE: Multidetector CT imaging of the chest, abdomen and pelvis was performed  following the standard protocol without IV contrast.  COMPARISON:  PET-CT, 11/11/2013.  Chest CT, 05/06/2014.  FINDINGS: CT CHEST FINDINGS  Thoracic inlet and axilla. There is left axillary adenopathy, largest node measuring 12 mm in short axis. There is intervening inflammatory type stranding. There is increased density in the retroareolar region of the left breast, most likely gynecomastia. Axillary adenopathy was present previously. Apparent gynecomastia is new. No neck base masses or adenopathy.  Mediastinum and hila: Focal bulge from the aortic arch is stable from the prior exam. Heart is normal in size. There are coronary artery calcifications, also stable. Mildly enlarged precarinal lymph node measuring 13 mm, previously subcentimeter. No hilar masses or adenopathy.  Lungs and pleura: 5 mm left lower lobe pulmonary nodule, image 45, series 4, with associated linear opacity. Nodule is new or increased in size. Linear opacity, likely scarring, stable. No other discrete nodules. Small right pleural effusion and minimal left pleural effusion. Mild lung base linear/reticular opacity is noted consistent subsegmental atelectasis. No lung consolidation or edema. There are moderate changes of centrilobular emphysema.  CT ABDOMEN AND PELVIS FINDINGS  Percutaneous biliary stent extends from the upper anterior abdominal wall to the region of the porta hepatis into the common bile duct passing through the ampulla of Vater into the third portion of the duodenum. There is some intrahepatic biliary air. There is prominence of the pancreatic head. Is no defined mass. Abnormal hypo attenuating soft tissue or fluid extends along the gastrohepatic ligament. Mild inflammatory type stranding lies  adjacent to the distal stomach and pancreatic head as well as the duodenum and along the anterior para renal fascia. A trace amount of ascites is seen along the anterior perirenal fascia, adjacent to the liver and along the right  pericolic gutter extending into the posterior pelvic recess.  Liver: No mass or focal lesion. Low density consistent with edema extends along the portal branches.  Spleen:  Unremarkable.  Gallbladder: Dense material lies within the gallbladder. No evidence of acute cholecystitis.  Adrenal glands:  No masses.  Kidneys, ureters, bladder: 2.9 cm low-density mass projects medially from the mid to lower pole left kidney, likely a cyst. No other renal masses. No collecting system stones. No hydronephrosis. Ureters normal in course and in caliber. Bladder is unremarkable.  Prostate gland:  Enlarged measuring 5.4 x 4.3 cm transversely.  Lymph nodes: Multiple prominent lymph nodes. These are most evident in the upper abdomen and in the peritoneal cavity. There is a right lower quadrant mesenteric node measuring 10 mm in short axis. Focal soft tissue nodules anterior to the distal stomach below the left lobe of the liver are noted which may reflect additional lymph nodes, largest measuring 11 mm in short axis. There is more a ill-defined soft tissue nodularity in the teres celiac region extending toward the porta hepatis and pancreatic head likely also adenopathy.  Vascular: Patient has an aortic stent graft excluding and infrarenal abdominal aortic aneurysm. Native aneurysm measures 4.9 x 5.6 cm. No evidence of rupture.  Gastrointestinal: Mild prominence of the colon and loops small bowel several with air-fluid levels. A mild adynamic ileus is suspected. There is no bowel wall thickening. There is no evidence of obstruction. Uninflamed diverticula are noted along the left colon.  Musculoskeletal:  No osteoblastic or osteolytic lesions.  IMPRESSION: 1. Pancreatic head mass is not well-defined on the unenhanced images. There is pancreatic head fullness. Abnormal low attenuation soft tissue extends from the pancreatic head along the porta hepatis, which may reflect edema. Some of this may be infiltrating tumor. There is shotty  adenopathy in the upper abdomen along the periceliac chain, gastrohepatic ligament and in the peripancreatic region. There is additional adenopathy in the peritoneal cavity most prominent in the right mid to lower quadrant. There is also abnormal soft tissue nodules in the anterior upper abdomen just below the left lobe of the liver, which may reflect additional adenopathy or peritoneal/omental carcinoma deposits. Peritoneal spread of carcinoma is suspected. Small amount of ascites. 2. Well-positioned biliary stent, percutaneously placed. No significant intrahepatic bile duct dilation. 3. Mild prominence of the colon small bowel. Mild adynamic ileus is suspected. No bowel obstruction or bowel inflammation. 4. Small right minimal left pleural effusions. 5. 5 mm left lower lobe pulmonary nodule. This could reflect neoplastic disease or be due to scarring. The nodule appears new or increased compared the prior CT. 6. Left axillary adenopathy similar to the prior CT. 7. Increased soft tissue in the retroareolar region of the left breast. This most likely gynecomastia, but is new. Consider follow-up with mammography if this patient has a firm palpable lump in this region. 8. Chronic findings include emphysema, changes from aortic aneurysm surgery and prostatic enlargement.   Electronically Signed   By: Lajean Manes M.D.   On: 01/16/2015 15:19   Ct Soft Tissue Neck Wo Contrast  01/16/2015   CLINICAL DATA:  Personal history of pancreatic and tonsillar cancer. Tonsillar cancer was on the left. Interval follow-up.  EXAM: CT NECK WITHOUT CONTRAST  TECHNIQUE: Multidetector  CT imaging of the neck was performed following the standard protocol without intravenous contrast.  COMPARISON:  05/06/2014 CT.  PET scan 11/11/2013  FINDINGS: No evidence of residual or recurrent tonsillar mass. Mild chronic scarring in the tonsillar region on the left as seen previously. The glottis is largely closed. This could be due to voluntary  glottic closure. Glottic/supraglottic mass is not excluded given this appearance however.  Along the right side of the trachea in the upper thoracic region, there is abnormal material which could be mucous most likely. Tracheal mass lesion is not excluded but not favored.  Surgical clips are present in the left neck related to previous node dissection or vascular surgery. No enlarged or low-density nodes are demonstrated.  Lung apices are clear. There is atherosclerosis of the aorta with a small focal aneurysm of the arch, not completely evaluated today but not visibly change since the previous study.  Submandibular glands and parotid glands are normal.  Ordinary cervical spondylosis.  IMPRESSION: Prominent tissue at the glottis/supraglottic region. This could be voluntary glottic closure. The possibility of a glottic mass does exist. Direct inspection would be advisable.  No evidence of recurrent disease in the left tonsillar region. No enlarged lymph nodes.  Abnormal material along the right side of the trachea in the upper thoracic region, most likely representing mucus. The possibility of a mass does exist but is less likely.   Electronically Signed   By: Nelson Chimes M.D.   On: 01/16/2015 15:39   Ct Chest Wo Contrast  01/16/2015   CLINICAL DATA:  Cancer ([place]) C80.1 (ICD-10-CM). Patient originally presented with painless jaundice, weakness and mellitus. Patient had a pancreatic MRI, not currently available for review, demonstrating a pancreatic mass obstructing the common bile duct. Patient with elevated bilirubin as high as 19. Also was a history of left tonsillar squamous cell carcinoma.  EXAM: CT CHEST, ABDOMEN AND PELVIS WITHOUT CONTRAST  TECHNIQUE: Multidetector CT imaging of the chest, abdomen and pelvis was performed following the standard protocol without IV contrast.  COMPARISON:  PET-CT, 11/11/2013.  Chest CT, 05/06/2014.  FINDINGS: CT CHEST FINDINGS  Thoracic inlet and axilla. There is left  axillary adenopathy, largest node measuring 12 mm in short axis. There is intervening inflammatory type stranding. There is increased density in the retroareolar region of the left breast, most likely gynecomastia. Axillary adenopathy was present previously. Apparent gynecomastia is new. No neck base masses or adenopathy.  Mediastinum and hila: Focal bulge from the aortic arch is stable from the prior exam. Heart is normal in size. There are coronary artery calcifications, also stable. Mildly enlarged precarinal lymph node measuring 13 mm, previously subcentimeter. No hilar masses or adenopathy.  Lungs and pleura: 5 mm left lower lobe pulmonary nodule, image 45, series 4, with associated linear opacity. Nodule is new or increased in size. Linear opacity, likely scarring, stable. No other discrete nodules. Small right pleural effusion and minimal left pleural effusion. Mild lung base linear/reticular opacity is noted consistent subsegmental atelectasis. No lung consolidation or edema. There are moderate changes of centrilobular emphysema.  CT ABDOMEN AND PELVIS FINDINGS  Percutaneous biliary stent extends from the upper anterior abdominal wall to the region of the porta hepatis into the common bile duct passing through the ampulla of Vater into the third portion of the duodenum. There is some intrahepatic biliary air. There is prominence of the pancreatic head. Is no defined mass. Abnormal hypo attenuating soft tissue or fluid extends along the gastrohepatic ligament. Mild inflammatory type  stranding lies adjacent to the distal stomach and pancreatic head as well as the duodenum and along the anterior para renal fascia. A trace amount of ascites is seen along the anterior perirenal fascia, adjacent to the liver and along the right pericolic gutter extending into the posterior pelvic recess.  Liver: No mass or focal lesion. Low density consistent with edema extends along the portal branches.  Spleen:  Unremarkable.   Gallbladder: Dense material lies within the gallbladder. No evidence of acute cholecystitis.  Adrenal glands:  No masses.  Kidneys, ureters, bladder: 2.9 cm low-density mass projects medially from the mid to lower pole left kidney, likely a cyst. No other renal masses. No collecting system stones. No hydronephrosis. Ureters normal in course and in caliber. Bladder is unremarkable.  Prostate gland:  Enlarged measuring 5.4 x 4.3 cm transversely.  Lymph nodes: Multiple prominent lymph nodes. These are most evident in the upper abdomen and in the peritoneal cavity. There is a right lower quadrant mesenteric node measuring 10 mm in short axis. Focal soft tissue nodules anterior to the distal stomach below the left lobe of the liver are noted which may reflect additional lymph nodes, largest measuring 11 mm in short axis. There is more a ill-defined soft tissue nodularity in the teres celiac region extending toward the porta hepatis and pancreatic head likely also adenopathy.  Vascular: Patient has an aortic stent graft excluding and infrarenal abdominal aortic aneurysm. Native aneurysm measures 4.9 x 5.6 cm. No evidence of rupture.  Gastrointestinal: Mild prominence of the colon and loops small bowel several with air-fluid levels. A mild adynamic ileus is suspected. There is no bowel wall thickening. There is no evidence of obstruction. Uninflamed diverticula are noted along the left colon.  Musculoskeletal:  No osteoblastic or osteolytic lesions.  IMPRESSION: 1. Pancreatic head mass is not well-defined on the unenhanced images. There is pancreatic head fullness. Abnormal low attenuation soft tissue extends from the pancreatic head along the porta hepatis, which may reflect edema. Some of this may be infiltrating tumor. There is shotty adenopathy in the upper abdomen along the periceliac chain, gastrohepatic ligament and in the peripancreatic region. There is additional adenopathy in the peritoneal cavity most prominent  in the right mid to lower quadrant. There is also abnormal soft tissue nodules in the anterior upper abdomen just below the left lobe of the liver, which may reflect additional adenopathy or peritoneal/omental carcinoma deposits. Peritoneal spread of carcinoma is suspected. Small amount of ascites. 2. Well-positioned biliary stent, percutaneously placed. No significant intrahepatic bile duct dilation. 3. Mild prominence of the colon small bowel. Mild adynamic ileus is suspected. No bowel obstruction or bowel inflammation. 4. Small right minimal left pleural effusions. 5. 5 mm left lower lobe pulmonary nodule. This could reflect neoplastic disease or be due to scarring. The nodule appears new or increased compared the prior CT. 6. Left axillary adenopathy similar to the prior CT. 7. Increased soft tissue in the retroareolar region of the left breast. This most likely gynecomastia, but is new. Consider follow-up with mammography if this patient has a firm palpable lump in this region. 8. Chronic findings include emphysema, changes from aortic aneurysm surgery and prostatic enlargement.   Electronically Signed   By: Lajean Manes M.D.   On: 01/16/2015 15:19   Ir Exchange Biliary Drain  01/14/2015   CLINICAL DATA:  81 year old with pancreatic cancer and biliary obstruction. The patient has an internal/external biliary drain. Despite drain placement, the bilirubin has been increasing. Cholangiogram scheduled  for evaluation of the tube and biliary system.  EXAM: CHOLANGIOGRAM THROUGH EXISTING CATHETER ; EXCHANGE OF PERCUTANEOUS BILIARY CATHETER WITH FLUOROSCOPY  Physician: Stephan Minister. Henn, MD  FLUOROSCOPY TIME:  1 minutes and 24 seconds, 36.6 mGy  MEDICATIONS: None  ANESTHESIA/SEDATION: Moderate sedation time: None  PROCEDURE: The procedure was explained to the patient. The risks and benefits of the procedure were discussed and the patient's questions were addressed. Informed consent was obtained from the patient. The  patient was placed supine on the interventional table. Contrast was injected through the existing catheter.  The catheter and surrounding skin were prepped and draped in sterile fashion. Maximal barrier sterile technique was utilized including caps, mask, sterile gowns, sterile gloves, sterile drape, hand hygiene and skin antiseptic. The retention suture was removed. The catheter was cut and removed over a Bentson wire. A new 10 [place] biliary drain was advanced over the wire and the tip was positioned in the duodenum. Small amount of contrast was injected to confirm placement. Catheter was flushed with saline. A dressing was placed over the catheter site.  FINDINGS: The left internal/external biliary drain was stable in position from the prior examination. The intrahepatic ducts are decompressed and appears to be draining both the left and right ducts. The distal aspect of the biliary drain was patent but there was sluggish flow and appeared to be partial occlusion. New catheter was positioned in the biliary system with the tip in the duodenum. New catheter is widely patent following contrast injection.  Estimated blood loss: None  CONTRAST:  10 mL Omnipaque [id number]  COMPLICATIONS: None  IMPRESSION: The biliary drain was patent but there was sluggish flow through the distal component. Therefore, the biliary drain was exchanged. Continue to follow liver enzymes.   Electronically Signed   By: Markus Daft M.D.   On: 01/14/2015 10:34    Labs:  CBC:  Recent Labs  01/12/15 0347 01/14/15 0510 01/15/15 0500 01/17/15 0519  WBC 10.9* 11.7* 10.6* 9.9  HGB 8.9* 8.3* 8.2* 7.7*  HCT 27.0* 24.4* 23.8* 22.2*  PLT 426* 366 345 340    COAGS:  Recent Labs  03/13/14 0425 01/09/15 1417 01/09/15 2108  INR 1.1 1.08 1.19  APTT 27.7 25  --     BMP:  Recent Labs  01/14/15 0510 01/15/15 0500 01/16/15 0535 01/17/15 0519  NA 130* 129* 127* 130*  K 3.6 3.6 3.6 3.9  CL 94* 90* 90* 94*  CO2 $Re'26 27 25 27    'qcS$ GLUCOSE 132* 130* 149* 123*  BUN 31* 35* 35* 27*  CALCIUM 8.4* 8.5* 8.5* 8.7*  CREATININE 1.68* 1.72* 1.57* 1.37*  GFRNONAA 37* 36* 40* 47*  GFRAA 43* 42* 47* 55*    LIVER FUNCTION TESTS:  Recent Labs  01/14/15 0510 01/15/15 0500 01/16/15 0535 01/17/15 0519  BILITOT 22.2* 19.8* 19.4* 19.3*  AST 119* 115* 149* 189*  ALT 107* 107* 114* 129*  ALKPHOS 475* 422* 382* 376*  PROT 5.9* 5.6* 5.9* 5.5*  ALBUMIN 1.9* 1.9* 1.8* 1.6*    Assessment and Plan: Patient with history of intra-and extra hepatic biliary ductal dilatation, pancreatic mass, status post PTC with internal/external biliary drainage, common bile duct lesion brush biopsy on 01/10/15; drain exchanged 10/5-70f ; pathology from CBD brush biopsy revealed adenocarcinoma; afebrile; on Cipro/diflucan for enterobacter/yeast in bile cx's; t bili only minimally decreased to 19.3(19.4); drain irrigated without difficulty or leak; will d/w Dr. Kathlene Cote; Fabienne Bruns Adella Nissen following   Signed: D. Rowe Robert 01/17/2015, 1:22 PM   I  spent a total of 15 minutes at the the patient's bedside AND on the patient's hospital floor or unit, greater than 50% of which was counseling/coordinating care for biliary drain

## 2015-01-17 NOTE — Progress Notes (Signed)
PROGRESS NOTE    Geoffrey Gregory GYJ:[id number] DOB: 08-21-1932 DOA: 01/09/2015 PCP: [name], MD  HPI/Brief narrative 81 yo bm transferred from [facility] for biliary drain placement by IR and further workup of pancreatic mass. Was admitted there with sodium of 111 felt to be due to SIADH, jaundice and pancreatic mass. 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 Transferring physician reportedly spoke with interventional radiology and gastroenterology, although it's unclear which gastroenterologist was contacted. Had been on hypertonic saline and hydrochlorothiazide- stopped. Internal and external biliary drain placed. 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 After biliary drain was upsized, total bilirubin transiently dropped from 22-19 but has plateaued. Discussed with [facility] oncology who recommended getting inpatient medical oncology consultation.   Assessment/Plan:  Principal Problem:  Malignant obstructive jaundice:  - Status post external and internal biliary drain placed by IR.  - Bilirubin decreased initially then started climbing.  - Dr. [name] discussed with IR & with [name], Oncologist in [place] -His navigator will arrange f/u after patient discharged -  Cholangiogram 10/5 through existing tube demonstrated partial occlusion of the train and the drain was successfully exchanged for a new 10 French catheter -  After biliary drain was upsized, total bilirubin transiently dropped from 22 >19 but has plateaued. Discussed with Oncology (Dr. Metro Kung patner) on 10/7 recommended inpatient oncology consultation. Discussed with Dr. Alvy Bimler who recommended CT neck, chest, abdomen and pelvis without contrast to assess for spread. - Possible biliary infection with yeast and Enterobacter: Started Cipro 10/7 and fluconazole 10/6. -  Oncology recommended GI consultation for possible ERCP. GI awaiting details from Dr. Allen Norris. As per family (son at bedside) patient was intubated but could not pass the endoscope. If unable to do ERCP then palliative care consult for hospice.  Active Problems:  Pancreatic mass:  - See above   SIADH (syndrome of inappropriate ADH production):  - Sodium had gradually improved after fluid restriction and Lasix. Lasix was temporarily stopped for 2 days due to worsening creatinine. Sodium has again started to drop. Discussed with Dr. Jimmy Footman, nephrology on 10/7: Recommends resuming Lasix 20 mg orally daily and we'll have to accept increased creatinine in the 1.5-2 range. Prognosis overall poor. - Lasix resumed. Sodium improved to 1:30.   Acid reflux:  - On PPI   Essential (primary) hypertension:  - Continue metoprolol. Would not resume thiazide. controlled   Hypercholesteremia   Cancer of tonsil:  - Status post chemoradiation, in remission per oncology note.    Anemia:  - No gross bleeding. anemia panel with minimally low iron at 41, but normal ferritin. Hb gradually dropping. Consider transfusion if hemoglobin less than 7 g per DL.   Euthyroid sick syndrome:  - Would repeat thyroid function tests in a few months.  Hypokalemia:  - Repleted by mouth.  AKI:  - See above. Lasix temporarily held due to worsening renal function. Creatinine has improved slightly. Lasix resumed. Creatinine slightly better.   Type II DM -  Reasonably controlled. Not on meds currently    DVT prophylaxis: SCDs & Lovenox added 10/7. Code Status:  full Family Communication:  Discussed with spouse and son at bedside on 10/8 Disposition Plan:  DC home when medically stable-pending improvement of LFTs.   Consultants:   Nephrology   Interventional radiology  Medical oncology.  [facility]  Procedures:   upsizing of biliary drain by IR  on 10/5  Antibiotics:   IV ceftezolin 1 dose on  10/1   IV Zosyn 1 dose on 10/5   Oral Cipro 10/7 >  Oral fluconazole 10/7 >  Subjective: Denied complaints.   Objective: Filed Vitals:   01/16/15 1027 01/16/15 2113 01/17/15 0518 01/17/15 0940  BP: 107/67 120/58 110/64 115/64  Pulse: 88 77 76 87  Temp: 98.4 F (36.9 C) 98.9 F (37.2 C) 98.5 F (36.9 C) 97.9 F (36.6 C)  TempSrc: Oral Oral Oral Oral  Resp: 18 16 17 17   Height:  5\' 7"  (1.702 m)    Weight:  59.58 kg (131 lb 5.6 oz)    SpO2: 99% 97% 100% 99%    Intake/Output Summary (Last 24 hours) at 01/17/15 1459 Last data filed at 01/17/15 1348  Gross per 24 hour  Intake    240 ml  Output   1145 ml  Net   -905 ml   Filed Weights   01/14/15 0500 01/14/15 2036 01/16/15 2113  Weight: 59.166 kg (130 lb 7 oz) 60.014 kg (132 lb 4.9 oz) 59.58 kg (131 lb 5.6 oz)     Exam:  General exam:  Elderly frail male lying comfortably supine in bed. Scleral and skin icterus. Respiratory system: Clear. No increased work of breathing. Cardiovascular system: S1 & S2 heard, RRR. No JVD, murmurs, gallops, clicks or pedal edema. Gastrointestinal system: Abdomen is nondistended, soft and nontender. Normal bowel sounds heard.  Biliary drain + Central nervous system: Alert and oriented. No focal neurological deficits. Extremities: Symmetric 5 x 5 power.   Data Reviewed: Basic Metabolic Panel:  Recent Labs Lab 01/12/15 1440 01/13/15 0431 01/14/15 0510 01/15/15 0500 01/16/15 0535 01/17/15 0519  NA 127* 131* 130* 129* 127* 130*  K 3.4* 4.1 3.6 3.6 3.6 3.9  CL 94* 98* 94* 90* 90* 94*  CO2 24 25 26 27 25 27   GLUCOSE 147* 146* 132* 130* 149* 123*  BUN 23* 26* 31* 35* 35* 27*  CREATININE 1.46* 1.61* 1.68* 1.72* 1.57* 1.37*  CALCIUM 8.1* 8.2* 8.4* 8.5* 8.5* 8.7*  PHOS 3.7  --   --   --   --   --    Liver Function Tests:  Recent Labs Lab 01/13/15 0431 01/14/15 0510 01/15/15 0500 01/16/15 0535 01/17/15 0519  AST 121* 119* 115* 149* 189*  ALT 110* 107* 107* 114* 129*   ALKPHOS 543* 475* 422* 382* 376*  BILITOT 20.5* 22.2* 19.8* 19.4* 19.3*  PROT 5.3* 5.9* 5.6* 5.9* 5.5*  ALBUMIN 1.9* 1.9* 1.9* 1.8* 1.6*   No results for input(s): LIPASE, AMYLASE in the last 168 hours. No results for input(s): AMMONIA in the last 168 hours. CBC:  Recent Labs Lab 01/11/15 0430 01/12/15 0347 01/14/15 0510 01/15/15 0500 01/17/15 0519  WBC 10.7* 10.9* 11.7* 10.6* 9.9  NEUTROABS 9.3*  --   --   --   --   HGB 9.1* 8.9* 8.3* 8.2* 7.7*  HCT 26.0* 27.0* 24.4* 23.8* 22.2*  MCV 82.5 83.6 83.8 83.5 83.8  PLT 409* 426* 366 345 340   Cardiac Enzymes: No results for input(s): CKTOTAL, CKMB, CKMBINDEX, TROPONINI in the last 168 hours. BNP (last 3 results) No results for input(s): PROBNP in the last 8760 hours. CBG: No results for input(s): GLUCAP in the last 168 hours.  Recent Results (from the past 240 hour(s))  Culture, body fluid-bottle     Status: None (Preliminary result)   Collection Time: 01/15/15  1:55 PM  Result Value Ref Range Status  Specimen Description BILE  Final   Special Requests BAA 10MLS  Final   Gram Stain   Final    GRAM NEGATIVE RODS IN BOTH AEROBIC AND ANAEROBIC BOTTLES BUDDING YEAST SEEN IN BOTH AEROBIC AND ANAEROBIC BOTTLES CRITICAL RESULT CALLED TO, READ BACK BY AND VERIFIED WITH: Pollie Meyer RN 2031 01/15/15 A BROWNING    Culture ENTEROBACTER SPECIES YEAST   Final   Report Status PENDING  Incomplete   Organism ID, Bacteria ENTEROBACTER SPECIES  Final      Susceptibility   Enterobacter species - MIC*    CEFAZOLIN >=64 RESISTANT Resistant     CEFEPIME 2 SENSITIVE Sensitive     CEFTAZIDIME >=64 RESISTANT Resistant     CEFTRIAXONE >=64 RESISTANT Resistant     CIPROFLOXACIN <=0.25 SENSITIVE Sensitive     GENTAMICIN <=1 SENSITIVE Sensitive     IMIPENEM <=0.25 SENSITIVE Sensitive     TRIMETH/SULFA <=20 SENSITIVE Sensitive     PIP/TAZO >=128 RESISTANT Resistant     * ENTEROBACTER SPECIES  Gram stain     Status: None   Collection Time:  01/15/15  1:55 PM  Result Value Ref Range Status   Specimen Description BILE  Final   Special Requests NONE  Final   Gram Stain   Final    YEAST Gram Stain Report Called to,Read Back By and Verified With: A [facility] RN 1443 01/15/15  A BROWNING    Report Status 01/15/2015 FINAL  Final           Studies: Ct Abdomen Pelvis Wo Contrast  01/16/2015   CLINICAL DATA:  Cancer ([place]) C80.1 (ICD-10-CM). Patient originally presented with painless jaundice, weakness and mellitus. Patient had a pancreatic MRI, not currently available for review, demonstrating a pancreatic mass obstructing the common bile duct. Patient with elevated bilirubin as high as 19. Also was a history of left tonsillar squamous cell carcinoma.  EXAM: CT CHEST, ABDOMEN AND PELVIS WITHOUT CONTRAST  TECHNIQUE: Multidetector CT imaging of the chest, abdomen and pelvis was performed following the standard protocol without IV contrast.  COMPARISON:  PET-CT, 11/11/2013.  Chest CT, 05/06/2014.  FINDINGS: CT CHEST FINDINGS  Thoracic inlet and axilla. There is left axillary adenopathy, largest node measuring 12 mm in short axis. There is intervening inflammatory type stranding. There is increased density in the retroareolar region of the left breast, most likely gynecomastia. Axillary adenopathy was present previously. Apparent gynecomastia is new. No neck base masses or adenopathy.  Mediastinum and hila: Focal bulge from the aortic arch is stable from the prior exam. Heart is normal in size. There are coronary artery calcifications, also stable. Mildly enlarged precarinal lymph node measuring 13 mm, previously subcentimeter. No hilar masses or adenopathy.  Lungs and pleura: 5 mm left lower lobe pulmonary nodule, image 45, series 4, with associated linear opacity. Nodule is new or increased in size. Linear opacity, likely scarring, stable. No other discrete nodules. Small right pleural effusion and minimal left pleural effusion. Mild lung base  linear/reticular opacity is noted consistent subsegmental atelectasis. No lung consolidation or edema. There are moderate changes of centrilobular emphysema.  CT ABDOMEN AND PELVIS FINDINGS  Percutaneous biliary stent extends from the upper anterior abdominal wall to the region of the porta hepatis into the common bile duct passing through the ampulla of Vater into the third portion of the duodenum. There is some intrahepatic biliary air. There is prominence of the pancreatic head. Is no defined mass. Abnormal hypo attenuating soft tissue or fluid extends along the gastrohepatic  ligament. Mild inflammatory type stranding lies adjacent to the distal stomach and pancreatic head as well as the duodenum and along the anterior para renal fascia. A trace amount of ascites is seen along the anterior perirenal fascia, adjacent to the liver and along the right pericolic gutter extending into the posterior pelvic recess.  Liver: No mass or focal lesion. Low density consistent with edema extends along the portal branches.  Spleen:  Unremarkable.  Gallbladder: Dense material lies within the gallbladder. No evidence of acute cholecystitis.  Adrenal glands:  No masses.  Kidneys, ureters, bladder: 2.9 cm low-density mass projects medially from the mid to lower pole left kidney, likely a cyst. No other renal masses. No collecting system stones. No hydronephrosis. Ureters normal in course and in caliber. Bladder is unremarkable.  Prostate gland:  Enlarged measuring 5.4 x 4.3 cm transversely.  Lymph nodes: Multiple prominent lymph nodes. These are most evident in the upper abdomen and in the peritoneal cavity. There is a right lower quadrant mesenteric node measuring 10 mm in short axis. Focal soft tissue nodules anterior to the distal stomach below the left lobe of the liver are noted which may reflect additional lymph nodes, largest measuring 11 mm in short axis. There is more a ill-defined soft tissue nodularity in the teres  celiac region extending toward the porta hepatis and pancreatic head likely also adenopathy.  Vascular: Patient has an aortic stent graft excluding and infrarenal abdominal aortic aneurysm. Native aneurysm measures 4.9 x 5.6 cm. No evidence of rupture.  Gastrointestinal: Mild prominence of the colon and loops small bowel several with air-fluid levels. A mild adynamic ileus is suspected. There is no bowel wall thickening. There is no evidence of obstruction. Uninflamed diverticula are noted along the left colon.  Musculoskeletal:  No osteoblastic or osteolytic lesions.  IMPRESSION: 1. Pancreatic head mass is not well-defined on the unenhanced images. There is pancreatic head fullness. Abnormal low attenuation soft tissue extends from the pancreatic head along the porta hepatis, which may reflect edema. Some of this may be infiltrating tumor. There is shotty adenopathy in the upper abdomen along the periceliac chain, gastrohepatic ligament and in the peripancreatic region. There is additional adenopathy in the peritoneal cavity most prominent in the right mid to lower quadrant. There is also abnormal soft tissue nodules in the anterior upper abdomen just below the left lobe of the liver, which may reflect additional adenopathy or peritoneal/omental carcinoma deposits. Peritoneal spread of carcinoma is suspected. Small amount of ascites. 2. Well-positioned biliary stent, percutaneously placed. No significant intrahepatic bile duct dilation. 3. Mild prominence of the colon small bowel. Mild adynamic ileus is suspected. No bowel obstruction or bowel inflammation. 4. Small right minimal left pleural effusions. 5. 5 mm left lower lobe pulmonary nodule. This could reflect neoplastic disease or be due to scarring. The nodule appears new or increased compared the prior CT. 6. Left axillary adenopathy similar to the prior CT. 7. Increased soft tissue in the retroareolar region of the left breast. This most likely gynecomastia,  but is new. Consider follow-up with mammography if this patient has a firm palpable lump in this region. 8. Chronic findings include emphysema, changes from aortic aneurysm surgery and prostatic enlargement.   Electronically Signed   By: Lajean Manes M.D.   On: 01/16/2015 15:19   Ct Soft Tissue Neck Wo Contrast  01/16/2015   CLINICAL DATA:  Personal history of pancreatic and tonsillar cancer. Tonsillar cancer was on the left. Interval follow-up.  EXAM: CT  NECK WITHOUT CONTRAST  TECHNIQUE: Multidetector CT imaging of the neck was performed following the standard protocol without intravenous contrast.  COMPARISON:  05/06/2014 CT.  PET scan 11/11/2013  FINDINGS: No evidence of residual or recurrent tonsillar mass. Mild chronic scarring in the tonsillar region on the left as seen previously. The glottis is largely closed. This could be due to voluntary glottic closure. Glottic/supraglottic mass is not excluded given this appearance however.  Along the right side of the trachea in the upper thoracic region, there is abnormal material which could be mucous most likely. Tracheal mass lesion is not excluded but not favored.  Surgical clips are present in the left neck related to previous node dissection or vascular surgery. No enlarged or low-density nodes are demonstrated.  Lung apices are clear. There is atherosclerosis of the aorta with a small focal aneurysm of the arch, not completely evaluated today but not visibly change since the previous study.  Submandibular glands and parotid glands are normal.  Ordinary cervical spondylosis.  IMPRESSION: Prominent tissue at the glottis/supraglottic region. This could be voluntary glottic closure. The possibility of a glottic mass does exist. Direct inspection would be advisable.  No evidence of recurrent disease in the left tonsillar region. No enlarged lymph nodes.  Abnormal material along the right side of the trachea in the upper thoracic region, most likely representing  mucus. The possibility of a mass does exist but is less likely.   Electronically Signed   By: Nelson Chimes M.D.   On: 01/16/2015 15:39   Ct Chest Wo Contrast  01/16/2015   CLINICAL DATA:  Cancer ([place]) C80.1 (ICD-10-CM). Patient originally presented with painless jaundice, weakness and mellitus. Patient had a pancreatic MRI, not currently available for review, demonstrating a pancreatic mass obstructing the common bile duct. Patient with elevated bilirubin as high as 19. Also was a history of left tonsillar squamous cell carcinoma.  EXAM: CT CHEST, ABDOMEN AND PELVIS WITHOUT CONTRAST  TECHNIQUE: Multidetector CT imaging of the chest, abdomen and pelvis was performed following the standard protocol without IV contrast.  COMPARISON:  PET-CT, 11/11/2013.  Chest CT, 05/06/2014.  FINDINGS: CT CHEST FINDINGS  Thoracic inlet and axilla. There is left axillary adenopathy, largest node measuring 12 mm in short axis. There is intervening inflammatory type stranding. There is increased density in the retroareolar region of the left breast, most likely gynecomastia. Axillary adenopathy was present previously. Apparent gynecomastia is new. No neck base masses or adenopathy.  Mediastinum and hila: Focal bulge from the aortic arch is stable from the prior exam. Heart is normal in size. There are coronary artery calcifications, also stable. Mildly enlarged precarinal lymph node measuring 13 mm, previously subcentimeter. No hilar masses or adenopathy.  Lungs and pleura: 5 mm left lower lobe pulmonary nodule, image 45, series 4, with associated linear opacity. Nodule is new or increased in size. Linear opacity, likely scarring, stable. No other discrete nodules. Small right pleural effusion and minimal left pleural effusion. Mild lung base linear/reticular opacity is noted consistent subsegmental atelectasis. No lung consolidation or edema. There are moderate changes of centrilobular emphysema.  CT ABDOMEN AND PELVIS FINDINGS   Percutaneous biliary stent extends from the upper anterior abdominal wall to the region of the porta hepatis into the common bile duct passing through the ampulla of Vater into the third portion of the duodenum. There is some intrahepatic biliary air. There is prominence of the pancreatic head. Is no defined mass. Abnormal hypo attenuating soft tissue or fluid extends along  the gastrohepatic ligament. Mild inflammatory type stranding lies adjacent to the distal stomach and pancreatic head as well as the duodenum and along the anterior para renal fascia. A trace amount of ascites is seen along the anterior perirenal fascia, adjacent to the liver and along the right pericolic gutter extending into the posterior pelvic recess.  Liver: No mass or focal lesion. Low density consistent with edema extends along the portal branches.  Spleen:  Unremarkable.  Gallbladder: Dense material lies within the gallbladder. No evidence of acute cholecystitis.  Adrenal glands:  No masses.  Kidneys, ureters, bladder: 2.9 cm low-density mass projects medially from the mid to lower pole left kidney, likely a cyst. No other renal masses. No collecting system stones. No hydronephrosis. Ureters normal in course and in caliber. Bladder is unremarkable.  Prostate gland:  Enlarged measuring 5.4 x 4.3 cm transversely.  Lymph nodes: Multiple prominent lymph nodes. These are most evident in the upper abdomen and in the peritoneal cavity. There is a right lower quadrant mesenteric node measuring 10 mm in short axis. Focal soft tissue nodules anterior to the distal stomach below the left lobe of the liver are noted which may reflect additional lymph nodes, largest measuring 11 mm in short axis. There is more a ill-defined soft tissue nodularity in the teres celiac region extending toward the porta hepatis and pancreatic head likely also adenopathy.  Vascular: Patient has an aortic stent graft excluding and infrarenal abdominal aortic aneurysm. Native  aneurysm measures 4.9 x 5.6 cm. No evidence of rupture.  Gastrointestinal: Mild prominence of the colon and loops small bowel several with air-fluid levels. A mild adynamic ileus is suspected. There is no bowel wall thickening. There is no evidence of obstruction. Uninflamed diverticula are noted along the left colon.  Musculoskeletal:  No osteoblastic or osteolytic lesions.  IMPRESSION: 1. Pancreatic head mass is not well-defined on the unenhanced images. There is pancreatic head fullness. Abnormal low attenuation soft tissue extends from the pancreatic head along the porta hepatis, which may reflect edema. Some of this may be infiltrating tumor. There is shotty adenopathy in the upper abdomen along the periceliac chain, gastrohepatic ligament and in the peripancreatic region. There is additional adenopathy in the peritoneal cavity most prominent in the right mid to lower quadrant. There is also abnormal soft tissue nodules in the anterior upper abdomen just below the left lobe of the liver, which may reflect additional adenopathy or peritoneal/omental carcinoma deposits. Peritoneal spread of carcinoma is suspected. Small amount of ascites. 2. Well-positioned biliary stent, percutaneously placed. No significant intrahepatic bile duct dilation. 3. Mild prominence of the colon small bowel. Mild adynamic ileus is suspected. No bowel obstruction or bowel inflammation. 4. Small right minimal left pleural effusions. 5. 5 mm left lower lobe pulmonary nodule. This could reflect neoplastic disease or be due to scarring. The nodule appears new or increased compared the prior CT. 6. Left axillary adenopathy similar to the prior CT. 7. Increased soft tissue in the retroareolar region of the left breast. This most likely gynecomastia, but is new. Consider follow-up with mammography if this patient has a firm palpable lump in this region. 8. Chronic findings include emphysema, changes from aortic aneurysm surgery and prostatic  enlargement.   Electronically Signed   By: Lajean Manes M.D.   On: 01/16/2015 15:19        Scheduled Meds: . ciprofloxacin  500 mg Oral BID  . enoxaparin (LOVENOX) injection  40 mg Subcutaneous Q24H  . fluconazole  200  mg Oral Q24H  . furosemide  20 mg Oral Daily  . metoprolol  25 mg Oral BID  . pantoprazole  40 mg Oral Daily  . sodium chloride  3 mL Intravenous Q12H  . sodium chloride  5 mL Intracatheter Q8H   Continuous Infusions:   Principal Problem:   Malignant obstructive jaundice Active Problems:   Dysphagia, oropharyngeal   Acid reflux   Essential (primary) hypertension   Hypercholesteremia   Cancer of tonsil ([facility])   Pancreatic mass   SIADH (syndrome of inappropriate ADH production) ([facility])   Anemia   Hyperbilirubinemia   Euthyroid sick syndrome   AKI (acute kidney injury) ([place])   Biliary obstruction   Pancreatic cancer ([place])   Malignant neoplasm of pancreas ([place])    Time spent: 20 minutes.    Vernell Leep, MD, FACP, FHM. [facility] Pager [phone]  If 7PM-7AM, please contact night-coverage [email or web address] Password [email or web address] 01/17/2015, 2:59 PM    LOS: 8 days

## 2015-01-18 LAB — CULTURE, BODY FLUID-BOTTLE

## 2015-01-18 LAB — CBC
HEMATOCRIT: 22.6 % — AB (ref 39.0–52.0)
HEMOGLOBIN: 7.9 g/dL — AB (ref 13.0–17.0)
MCH: 29.4 pg (ref 26.0–34.0)
MCHC: 35 g/dL (ref 30.0–36.0)
MCV: 84 fL (ref 78.0–100.0)
Platelets: 338 10*3/uL (ref 150–400)
RBC: 2.69 MIL/uL — ABNORMAL LOW (ref 4.22–5.81)
RDW: 20.1 % — ABNORMAL HIGH (ref 11.5–15.5)
WBC: 12.3 10*3/uL — AB (ref 4.0–10.5)

## 2015-01-18 LAB — TYPE AND SCREEN
ABO/RH(D): A POS
ANTIBODY SCREEN: NEGATIVE

## 2015-01-18 LAB — CULTURE, BODY FLUID W GRAM STAIN -BOTTLE

## 2015-01-18 LAB — ABO/RH: ABO/RH(D): A POS

## 2015-01-18 NOTE — Progress Notes (Signed)
PROGRESS NOTE    Geoffrey Gregory HQI:[id number] DOB: 10-13-1932 DOA: 01/09/2015 PCP: [name], MD  HPI/Brief narrative 81 yo bm transferred from [facility] for biliary drain placement by IR and further workup of pancreatic mass. Was admitted there with sodium of 111 felt to be due to SIADH, jaundice and pancreatic mass. 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 Transferring physician reportedly spoke with interventional radiology and gastroenterology, although it's unclear which gastroenterologist was contacted. Had been on hypertonic saline and hydrochlorothiazide- stopped. Internal and external biliary drain placed. 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 After biliary drain was upsized, total bilirubin transiently dropped from 22-19 but has plateaued. Discussed with [facility] oncology who recommended getting inpatient medical oncology consultation.   Assessment/Plan:  Principal Problem:  Malignant obstructive jaundice:  - Status post external and internal biliary drain placed by IR.  - Bilirubin decreased initially then started climbing.  - Dr. [name] discussed with IR & with [name], Oncologist in [place] -His navigator will arrange f/u after patient discharged -  Cholangiogram 10/5 through existing tube demonstrated partial occlusion of the train and the drain was successfully exchanged for a new 10 French catheter -  After biliary drain was upsized, total bilirubin transiently dropped from 22 >19 but has plateaued. Discussed with Oncology (Dr. Metro Kung patner) on 10/7 recommended inpatient oncology consultation. Discussed with Dr. Alvy Bimler who recommended CT neck, chest, abdomen and pelvis without contrast to assess for spread. - Possible biliary infection with yeast and Enterobacter: Started Cipro 10/7 and fluconazole 10/6. -  Oncology recommended GI consultation for possible ERCP. GI awaiting details from Dr. Allen Norris prior to making any decisions regarding further intervention. As per family (son at bedside) patient was intubated but could not pass the endoscope. If unable to do ERCP then palliative care consult for hospice.  Active Problems:  Pancreatic mass:  - See above   SIADH (syndrome of inappropriate ADH production):  - Sodium had gradually improved after fluid restriction and Lasix. Lasix was temporarily stopped for 2 days due to worsening creatinine. Sodium has again started to drop. Discussed with Dr. Jimmy Footman, nephrology on 10/7: Recommends resuming Lasix 20 mg orally daily and we'll have to accept increased creatinine in the 1.5-2 range. Prognosis overall poor. - Lasix resumed. Sodium improved to 1:30.   Acid reflux:  - On PPI   Essential (primary) hypertension:  - Continue metoprolol. Would not resume thiazide. controlled   Hypercholesteremia   Cancer of tonsil:  - Status post chemoradiation, in remission per oncology note.    Anemia:  - No gross bleeding. anemia panel with minimally low iron at 41, but normal ferritin. Hb gradually dropping. Consider transfusion if hemoglobin less than 7 g per DL. Hemoglobin 7.9.   Euthyroid sick syndrome:  - Would repeat thyroid function tests in a few months.  Hypokalemia:  - Repleted by mouth.  AKI:  - See above. Lasix temporarily held due to worsening renal function. Creatinine has improved slightly. Lasix resumed. Creatinine slightly better.   Type II DM -  Reasonably controlled. Not on meds currently    DVT prophylaxis: SCDs & Lovenox added 10/7. Code Status:  full Family Communication:  Discussed with spouse at bedside on 10/9 Disposition Plan:  DC home when medically stable-pending improvement of LFTs.   Consultants:   Nephrology   Interventional radiology  Medical oncology.  [facility]  Procedures:  upsizing of biliary  drain by IR on 10/5  Antibiotics:   IV ceftezolin 1 dose on 10/1   IV Zosyn 1 dose on 10/5   Oral Cipro 10/7 >  Oral fluconazole 10/7 >  Subjective: Denied new complaints. Tolerating diet and had BMs. No significant pain issues.  Objective: Filed Vitals:   01/17/15 1727 01/17/15 2135 01/18/15 0544 01/18/15 0950  BP: 119/62 121/61 123/60 113/64  Pulse: 81 83 80 113  Temp: 98 F (36.7 C) 98.5 F (36.9 C) 98.5 F (36.9 C) 97.5 F (36.4 C)  TempSrc: Oral Oral Oral Oral  Resp: 18 18 16 16   Height:  5\' 7"  (1.702 m)    Weight:  60 kg (132 lb 4.4 oz)    SpO2: 96% 99% 98% 92%    Intake/Output Summary (Last 24 hours) at 01/18/15 1207 Last data filed at 01/18/15 1093  Gross per 24 hour  Intake    130 ml  Output   1020 ml  Net   -890 ml   Filed Weights   01/14/15 2036 01/16/15 2113 01/17/15 2135  Weight: 60.014 kg (132 lb 4.9 oz) 59.58 kg (131 lb 5.6 oz) 60 kg (132 lb 4.4 oz)     Exam:  General exam:  Elderly frail male lying comfortably supine in bed. Scleral and skin icterus. Respiratory system: Clear. No increased work of breathing. Cardiovascular system: S1 & S2 heard, RRR. No JVD, murmurs, gallops, clicks or pedal edema. Gastrointestinal system: Abdomen is nondistended, soft and nontender. Normal bowel sounds heard.  Biliary drain + Central nervous system: Alert and oriented. No focal neurological deficits. Extremities: Symmetric 5 x 5 power.   Data Reviewed: Basic Metabolic Panel:  Recent Labs Lab 01/12/15 1440 01/13/15 0431 01/14/15 0510 01/15/15 0500 01/16/15 0535 01/17/15 0519  NA 127* 131* 130* 129* 127* 130*  K 3.4* 4.1 3.6 3.6 3.6 3.9  CL 94* 98* 94* 90* 90* 94*  CO2 24 25 26 27 25 27   GLUCOSE 147* 146* 132* 130* 149* 123*  BUN 23* 26* 31* 35* 35* 27*  CREATININE 1.46* 1.61* 1.68* 1.72* 1.57* 1.37*  CALCIUM 8.1* 8.2* 8.4* 8.5* 8.5* 8.7*  PHOS 3.7  --   --   --   --   --    Liver Function Tests:  Recent Labs Lab 01/13/15 0431  01/14/15 0510 01/15/15 0500 01/16/15 0535 01/17/15 0519  AST 121* 119* 115* 149* 189*  ALT 110* 107* 107* 114* 129*  ALKPHOS 543* 475* 422* 382* 376*  BILITOT 20.5* 22.2* 19.8* 19.4* 19.3*  PROT 5.3* 5.9* 5.6* 5.9* 5.5*  ALBUMIN 1.9* 1.9* 1.9* 1.8* 1.6*   No results for input(s): LIPASE, AMYLASE in the last 168 hours. No results for input(s): AMMONIA in the last 168 hours. CBC:  Recent Labs Lab 01/12/15 0347 01/14/15 0510 01/15/15 0500 01/17/15 0519 01/18/15 0512  WBC 10.9* 11.7* 10.6* 9.9 12.3*  HGB 8.9* 8.3* 8.2* 7.7* 7.9*  HCT 27.0* 24.4* 23.8* 22.2* 22.6*  MCV 83.6 83.8 83.5 83.8 84.0  PLT 426* 366 345 340 338   Cardiac Enzymes: No results for input(s): CKTOTAL, CKMB, CKMBINDEX, TROPONINI in the last 168 hours. BNP (last 3 results) No results for input(s): PROBNP in the last 8760 hours. CBG: No results for input(s): GLUCAP in the last 168 hours.  Recent Results (from the past 240 hour(s))  Culture, body fluid-bottle     Status: None   Collection Time: 01/15/15  1:55 PM  Result Value Ref Range Status   Specimen  Description BILE  Final   Special Requests BAA 10MLS  Final   Gram Stain   Final    GRAM NEGATIVE RODS IN BOTH AEROBIC AND ANAEROBIC BOTTLES BUDDING YEAST SEEN IN BOTH AEROBIC AND ANAEROBIC BOTTLES CRITICAL RESULT CALLED TO, READ BACK BY AND VERIFIED WITH: Pollie Meyer RN [id number] 01/15/15 A BROWNING    Culture ENTEROBACTER SPECIES CANDIDA ALBICANS   Final   Report Status 01/18/2015 FINAL  Final   Organism ID, Bacteria ENTEROBACTER SPECIES  Final      Susceptibility   Enterobacter species - MIC*    CEFAZOLIN >=64 RESISTANT Resistant     CEFEPIME 2 SENSITIVE Sensitive     CEFTAZIDIME >=64 RESISTANT Resistant     CEFTRIAXONE >=64 RESISTANT Resistant     CIPROFLOXACIN <=0.25 SENSITIVE Sensitive     GENTAMICIN <=1 SENSITIVE Sensitive     IMIPENEM <=0.25 SENSITIVE Sensitive     TRIMETH/SULFA <=20 SENSITIVE Sensitive     PIP/TAZO >=128 RESISTANT Resistant      * ENTEROBACTER SPECIES  Gram stain     Status: None   Collection Time: 01/15/15  1:55 PM  Result Value Ref Range Status   Specimen Description BILE  Final   Special Requests NONE  Final   Gram Stain   Final    YEAST Gram Stain Report Called to,Read Back By and Verified With: A [facility] RN [id number] 01/15/15  A BROWNING    Report Status 01/15/2015 FINAL  Final           Studies: Ct Abdomen Pelvis Wo Contrast  01/16/2015   CLINICAL DATA:  Cancer ([place]) C80.1 (ICD-10-CM). Patient originally presented with painless jaundice, weakness and mellitus. Patient had a pancreatic MRI, not currently available for review, demonstrating a pancreatic mass obstructing the common bile duct. Patient with elevated bilirubin as high as 19. Also was a history of left tonsillar squamous cell carcinoma.  EXAM: CT CHEST, ABDOMEN AND PELVIS WITHOUT CONTRAST  TECHNIQUE: Multidetector CT imaging of the chest, abdomen and pelvis was performed following the standard protocol without IV contrast.  COMPARISON:  PET-CT, 11/11/2013.  Chest CT, 05/06/2014.  FINDINGS: CT CHEST FINDINGS  Thoracic inlet and axilla. There is left axillary adenopathy, largest node measuring 12 mm in short axis. There is intervening inflammatory type stranding. There is increased density in the retroareolar region of the left breast, most likely gynecomastia. Axillary adenopathy was present previously. Apparent gynecomastia is new. No neck base masses or adenopathy.  Mediastinum and hila: Focal bulge from the aortic arch is stable from the prior exam. Heart is normal in size. There are coronary artery calcifications, also stable. Mildly enlarged precarinal lymph node measuring 13 mm, previously subcentimeter. No hilar masses or adenopathy.  Lungs and pleura: 5 mm left lower lobe pulmonary nodule, image 45, series 4, with associated linear opacity. Nodule is new or increased in size. Linear opacity, likely scarring, stable. No other discrete nodules. Small  right pleural effusion and minimal left pleural effusion. Mild lung base linear/reticular opacity is noted consistent subsegmental atelectasis. No lung consolidation or edema. There are moderate changes of centrilobular emphysema.  CT ABDOMEN AND PELVIS FINDINGS  Percutaneous biliary stent extends from the upper anterior abdominal wall to the region of the porta hepatis into the common bile duct passing through the ampulla of Vater into the third portion of the duodenum. There is some intrahepatic biliary air. There is prominence of the pancreatic head. Is no defined mass. Abnormal hypo attenuating soft tissue or fluid extends along the  gastrohepatic ligament. Mild inflammatory type stranding lies adjacent to the distal stomach and pancreatic head as well as the duodenum and along the anterior para renal fascia. A trace amount of ascites is seen along the anterior perirenal fascia, adjacent to the liver and along the right pericolic gutter extending into the posterior pelvic recess.  Liver: No mass or focal lesion. Low density consistent with edema extends along the portal branches.  Spleen:  Unremarkable.  Gallbladder: Dense material lies within the gallbladder. No evidence of acute cholecystitis.  Adrenal glands:  No masses.  Kidneys, ureters, bladder: 2.9 cm low-density mass projects medially from the mid to lower pole left kidney, likely a cyst. No other renal masses. No collecting system stones. No hydronephrosis. Ureters normal in course and in caliber. Bladder is unremarkable.  Prostate gland:  Enlarged measuring 5.4 x 4.3 cm transversely.  Lymph nodes: Multiple prominent lymph nodes. These are most evident in the upper abdomen and in the peritoneal cavity. There is a right lower quadrant mesenteric node measuring 10 mm in short axis. Focal soft tissue nodules anterior to the distal stomach below the left lobe of the liver are noted which may reflect additional lymph nodes, largest measuring 11 mm in short  axis. There is more a ill-defined soft tissue nodularity in the teres celiac region extending toward the porta hepatis and pancreatic head likely also adenopathy.  Vascular: Patient has an aortic stent graft excluding and infrarenal abdominal aortic aneurysm. Native aneurysm measures 4.9 x 5.6 cm. No evidence of rupture.  Gastrointestinal: Mild prominence of the colon and loops small bowel several with air-fluid levels. A mild adynamic ileus is suspected. There is no bowel wall thickening. There is no evidence of obstruction. Uninflamed diverticula are noted along the left colon.  Musculoskeletal:  No osteoblastic or osteolytic lesions.  IMPRESSION: 1. Pancreatic head mass is not well-defined on the unenhanced images. There is pancreatic head fullness. Abnormal low attenuation soft tissue extends from the pancreatic head along the porta hepatis, which may reflect edema. Some of this may be infiltrating tumor. There is shotty adenopathy in the upper abdomen along the periceliac chain, gastrohepatic ligament and in the peripancreatic region. There is additional adenopathy in the peritoneal cavity most prominent in the right mid to lower quadrant. There is also abnormal soft tissue nodules in the anterior upper abdomen just below the left lobe of the liver, which may reflect additional adenopathy or peritoneal/omental carcinoma deposits. Peritoneal spread of carcinoma is suspected. Small amount of ascites. 2. Well-positioned biliary stent, percutaneously placed. No significant intrahepatic bile duct dilation. 3. Mild prominence of the colon small bowel. Mild adynamic ileus is suspected. No bowel obstruction or bowel inflammation. 4. Small right minimal left pleural effusions. 5. 5 mm left lower lobe pulmonary nodule. This could reflect neoplastic disease or be due to scarring. The nodule appears new or increased compared the prior CT. 6. Left axillary adenopathy similar to the prior CT. 7. Increased soft tissue in the  retroareolar region of the left breast. This most likely gynecomastia, but is new. Consider follow-up with mammography if this patient has a firm palpable lump in this region. 8. Chronic findings include emphysema, changes from aortic aneurysm surgery and prostatic enlargement.   Electronically Signed   By: Lajean Manes M.D.   On: 01/16/2015 15:19   Ct Soft Tissue Neck Wo Contrast  01/16/2015   CLINICAL DATA:  Personal history of pancreatic and tonsillar cancer. Tonsillar cancer was on the left. Interval follow-up.  EXAM:  CT NECK WITHOUT CONTRAST  TECHNIQUE: Multidetector CT imaging of the neck was performed following the standard protocol without intravenous contrast.  COMPARISON:  05/06/2014 CT.  PET scan 11/11/2013  FINDINGS: No evidence of residual or recurrent tonsillar mass. Mild chronic scarring in the tonsillar region on the left as seen previously. The glottis is largely closed. This could be due to voluntary glottic closure. Glottic/supraglottic mass is not excluded given this appearance however.  Along the right side of the trachea in the upper thoracic region, there is abnormal material which could be mucous most likely. Tracheal mass lesion is not excluded but not favored.  Surgical clips are present in the left neck related to previous node dissection or vascular surgery. No enlarged or low-density nodes are demonstrated.  Lung apices are clear. There is atherosclerosis of the aorta with a small focal aneurysm of the arch, not completely evaluated today but not visibly change since the previous study.  Submandibular glands and parotid glands are normal.  Ordinary cervical spondylosis.  IMPRESSION: Prominent tissue at the glottis/supraglottic region. This could be voluntary glottic closure. The possibility of a glottic mass does exist. Direct inspection would be advisable.  No evidence of recurrent disease in the left tonsillar region. No enlarged lymph nodes.  Abnormal material along the right side  of the trachea in the upper thoracic region, most likely representing mucus. The possibility of a mass does exist but is less likely.   Electronically Signed   By: Nelson Chimes M.D.   On: 01/16/2015 15:39   Ct Chest Wo Contrast  01/16/2015   CLINICAL DATA:  Cancer ([place]) C80.1 (ICD-10-CM). Patient originally presented with painless jaundice, weakness and mellitus. Patient had a pancreatic MRI, not currently available for review, demonstrating a pancreatic mass obstructing the common bile duct. Patient with elevated bilirubin as high as 19. Also was a history of left tonsillar squamous cell carcinoma.  EXAM: CT CHEST, ABDOMEN AND PELVIS WITHOUT CONTRAST  TECHNIQUE: Multidetector CT imaging of the chest, abdomen and pelvis was performed following the standard protocol without IV contrast.  COMPARISON:  PET-CT, 11/11/2013.  Chest CT, 05/06/2014.  FINDINGS: CT CHEST FINDINGS  Thoracic inlet and axilla. There is left axillary adenopathy, largest node measuring 12 mm in short axis. There is intervening inflammatory type stranding. There is increased density in the retroareolar region of the left breast, most likely gynecomastia. Axillary adenopathy was present previously. Apparent gynecomastia is new. No neck base masses or adenopathy.  Mediastinum and hila: Focal bulge from the aortic arch is stable from the prior exam. Heart is normal in size. There are coronary artery calcifications, also stable. Mildly enlarged precarinal lymph node measuring 13 mm, previously subcentimeter. No hilar masses or adenopathy.  Lungs and pleura: 5 mm left lower lobe pulmonary nodule, image 45, series 4, with associated linear opacity. Nodule is new or increased in size. Linear opacity, likely scarring, stable. No other discrete nodules. Small right pleural effusion and minimal left pleural effusion. Mild lung base linear/reticular opacity is noted consistent subsegmental atelectasis. No lung consolidation or edema. There are moderate  changes of centrilobular emphysema.  CT ABDOMEN AND PELVIS FINDINGS  Percutaneous biliary stent extends from the upper anterior abdominal wall to the region of the porta hepatis into the common bile duct passing through the ampulla of Vater into the third portion of the duodenum. There is some intrahepatic biliary air. There is prominence of the pancreatic head. Is no defined mass. Abnormal hypo attenuating soft tissue or fluid extends  along the gastrohepatic ligament. Mild inflammatory type stranding lies adjacent to the distal stomach and pancreatic head as well as the duodenum and along the anterior para renal fascia. A trace amount of ascites is seen along the anterior perirenal fascia, adjacent to the liver and along the right pericolic gutter extending into the posterior pelvic recess.  Liver: No mass or focal lesion. Low density consistent with edema extends along the portal branches.  Spleen:  Unremarkable.  Gallbladder: Dense material lies within the gallbladder. No evidence of acute cholecystitis.  Adrenal glands:  No masses.  Kidneys, ureters, bladder: 2.9 cm low-density mass projects medially from the mid to lower pole left kidney, likely a cyst. No other renal masses. No collecting system stones. No hydronephrosis. Ureters normal in course and in caliber. Bladder is unremarkable.  Prostate gland:  Enlarged measuring 5.4 x 4.3 cm transversely.  Lymph nodes: Multiple prominent lymph nodes. These are most evident in the upper abdomen and in the peritoneal cavity. There is a right lower quadrant mesenteric node measuring 10 mm in short axis. Focal soft tissue nodules anterior to the distal stomach below the left lobe of the liver are noted which may reflect additional lymph nodes, largest measuring 11 mm in short axis. There is more a ill-defined soft tissue nodularity in the teres celiac region extending toward the porta hepatis and pancreatic head likely also adenopathy.  Vascular: Patient has an aortic  stent graft excluding and infrarenal abdominal aortic aneurysm. Native aneurysm measures 4.9 x 5.6 cm. No evidence of rupture.  Gastrointestinal: Mild prominence of the colon and loops small bowel several with air-fluid levels. A mild adynamic ileus is suspected. There is no bowel wall thickening. There is no evidence of obstruction. Uninflamed diverticula are noted along the left colon.  Musculoskeletal:  No osteoblastic or osteolytic lesions.  IMPRESSION: 1. Pancreatic head mass is not well-defined on the unenhanced images. There is pancreatic head fullness. Abnormal low attenuation soft tissue extends from the pancreatic head along the porta hepatis, which may reflect edema. Some of this may be infiltrating tumor. There is shotty adenopathy in the upper abdomen along the periceliac chain, gastrohepatic ligament and in the peripancreatic region. There is additional adenopathy in the peritoneal cavity most prominent in the right mid to lower quadrant. There is also abnormal soft tissue nodules in the anterior upper abdomen just below the left lobe of the liver, which may reflect additional adenopathy or peritoneal/omental carcinoma deposits. Peritoneal spread of carcinoma is suspected. Small amount of ascites. 2. Well-positioned biliary stent, percutaneously placed. No significant intrahepatic bile duct dilation. 3. Mild prominence of the colon small bowel. Mild adynamic ileus is suspected. No bowel obstruction or bowel inflammation. 4. Small right minimal left pleural effusions. 5. 5 mm left lower lobe pulmonary nodule. This could reflect neoplastic disease or be due to scarring. The nodule appears new or increased compared the prior CT. 6. Left axillary adenopathy similar to the prior CT. 7. Increased soft tissue in the retroareolar region of the left breast. This most likely gynecomastia, but is new. Consider follow-up with mammography if this patient has a firm palpable lump in this region. 8. Chronic findings  include emphysema, changes from aortic aneurysm surgery and prostatic enlargement.   Electronically Signed   By: Lajean Manes M.D.   On: 01/16/2015 15:19        Scheduled Meds: . ciprofloxacin  500 mg Oral BID  . enoxaparin (LOVENOX) injection  40 mg Subcutaneous Q24H  . fluconazole  200 mg Oral Q24H  . furosemide  20 mg Oral Daily  . metoprolol  25 mg Oral BID  . pantoprazole  40 mg Oral Daily  . sodium chloride  3 mL Intravenous Q12H  . sodium chloride  5 mL Intracatheter Q8H   Continuous Infusions:   Principal Problem:   Malignant obstructive jaundice Active Problems:   Dysphagia, oropharyngeal   Acid reflux   Essential (primary) hypertension   Hypercholesteremia   Cancer of tonsil ([facility])   Pancreatic mass   SIADH (syndrome of inappropriate ADH production) ([facility])   Anemia   Hyperbilirubinemia   Euthyroid sick syndrome   AKI (acute kidney injury) ([place])   Biliary obstruction   Pancreatic cancer ([place])   Malignant neoplasm of pancreas ([place])    Time spent: 20 minutes.    Vernell Leep, MD, FACP, FHM. [facility] Pager [phone]  If 7PM-7AM, please contact night-coverage [email or web address] Password [email or web address] 01/18/2015, 12:07 PM    LOS: 9 days

## 2015-01-19 LAB — COMPREHENSIVE METABOLIC PANEL
ALK PHOS: 319 U/L — AB (ref 38–126)
ALT: 163 U/L — AB (ref 17–63)
AST: 217 U/L — ABNORMAL HIGH (ref 15–41)
Albumin: 1.6 g/dL — ABNORMAL LOW (ref 3.5–5.0)
Anion gap: 11 (ref 5–15)
BILIRUBIN TOTAL: 15.5 mg/dL — AB (ref 0.3–1.2)
BUN: 32 mg/dL — ABNORMAL HIGH (ref 6–20)
CALCIUM: 8.7 mg/dL — AB (ref 8.9–10.3)
CO2: 25 mmol/L (ref 22–32)
CREATININE: 1.44 mg/dL — AB (ref 0.61–1.24)
Chloride: 96 mmol/L — ABNORMAL LOW (ref 101–111)
GFR calc non Af Amer: 45 mL/min — ABNORMAL LOW (ref >60)
GFR, EST AFRICAN AMERICAN: 52 mL/min — AB (ref >60)
GLUCOSE: 128 mg/dL — AB (ref 65–99)
Potassium: 3.8 mmol/L (ref 3.5–5.1)
SODIUM: 132 mmol/L — AB (ref 135–145)
TOTAL PROTEIN: 5.6 g/dL — AB (ref 6.5–8.1)

## 2015-01-19 MED ORDER — PIPERACILLIN-TAZOBACTAM 3.375 G IVPB
3.3750 g | INTRAVENOUS | Status: AC
  Filled 2015-01-19: qty 50

## 2015-01-19 MED ORDER — ENOXAPARIN SODIUM 40 MG/0.4ML ~~LOC~~ SOLN
40.0000 mg | SUBCUTANEOUS | Status: AC
  Administered 2015-01-19: 40 mg via SUBCUTANEOUS
  Filled 2015-01-19: qty 0.4

## 2015-01-19 MED ORDER — ENOXAPARIN SODIUM 40 MG/0.4ML ~~LOC~~ SOLN
40.0000 mg | SUBCUTANEOUS | Status: DC
  Administered 2015-01-22: 40 mg via SUBCUTANEOUS
  Filled 2015-01-19: qty 0.4

## 2015-01-19 NOTE — Progress Notes (Addendum)
PROGRESS NOTE    Geoffrey Gregory NAT:[id number] DOB: 1932/10/20 DOA: 01/09/2015 PCP: [name], MD  HPI/Brief narrative 81 yo bm transferred from [facility] for biliary drain placement by IR and further workup of pancreatic mass. Was admitted there with sodium of 111 felt to be due to SIADH, jaundice and pancreatic mass. 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 Transferring physician reportedly spoke with interventional radiology and gastroenterology, although it's unclear which gastroenterologist was contacted. Had been on hypertonic saline and hydrochlorothiazide- stopped. Internal and external biliary drain placed. 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 After biliary drain was upsized, total bilirubin transiently dropped from 22-19 but has plateaued. Discussed with [facility] oncology who recommended getting inpatient medical oncology consultation.   Assessment/Plan:  Principal Problem:  Malignant obstructive jaundice:  - Status post external and internal biliary drain placed by IR.  - Bilirubin decreased initially then started climbing.  - Dr. [name] discussed with IR & with [name], Oncologist in [place] -His navigator will arrange f/u after patient discharged -  Cholangiogram 10/5 through existing tube demonstrated partial occlusion of the train and the drain was successfully exchanged for a new 10 French catheter -  After biliary drain was upsized, total bilirubin transiently dropped from 22 >19 but has plateaued. Discussed with Oncology (Dr. Metro Kung patner) on 10/7 recommended inpatient oncology consultation. Discussed with Dr. Alvy Bimler who recommended CT neck, chest, abdomen and pelvis without contrast to assess for spread. - Possible biliary infection with yeast and Enterobacter: Started Cipro 10/7 and fluconazole 10/6. -  Oncology recommended GI consultation for possible ERCP. GI follow-up 10/10 appreciated: Recommend IR to place internal metal stent and indicated that it should have the same success as ERCP for internal stenting. Discussed with oncology. - Discussed with IR on 10/10 and will pursue stenting ASAP. - Bilirubin has finally decreased to 15 range.  Active Problems:  Pancreatic mass:  - See above   SIADH (syndrome of inappropriate ADH production):  - Sodium had gradually improved after fluid restriction and Lasix. Lasix was temporarily stopped for 2 days due to worsening creatinine. Sodium has again started to drop. Discussed with Dr. Jimmy Footman, nephrology on 10/7: Recommends resuming Lasix 20 mg orally daily and we'll have to accept increased creatinine in the 1.5-2 range. Prognosis overall poor. - Lasix resumed. Sodium improved and stable and 130 range   Acid reflux:  - On PPI   Essential (primary) hypertension:  - Continue metoprolol. Would not resume thiazide. controlled   Hypercholesteremia   Cancer of tonsil:  - Status post chemoradiation, in remission per oncology note.    Anemia:  - No gross bleeding. anemia panel with minimally low iron at 41, but normal ferritin. Hb gradually dropping. Consider transfusion if hemoglobin less than 7 g per DL. Hemoglobin 7.9.   Euthyroid sick syndrome:  - Would repeat thyroid function tests in a few months.  Hypokalemia:  - Repleted by mouth.  AKI:  - See above. Lasix temporarily held due to worsening renal function. Creatinine has improved slightly. Lasix resumed. Creatinine stable in the 1.3-1.4 range   Type II DM -  Reasonably controlled. Not on meds currently    DVT prophylaxis: SCDs & Lovenox added 10/7. Code Status:  full Family Communication:  Discussed with spouse and youngest son at bedside on 10/10 Disposition Plan:  DC home when medically stable-pending improvement of LFTs.  Consultants:   Nephrology    Interventional radiology  Medical oncology.  [facility]-signed off 10/10  Procedures:   upsizing of biliary drain by IR on 10/5  Antibiotics:   IV ceftezolin 1 dose on 10/1   IV Zosyn 1 dose on 10/5   Oral Cipro 10/7 >  Oral fluconazole 10/7 >  Subjective: Denies complaints.  Objective: Filed Vitals:   01/18/15 1821 01/18/15 2229 01/19/15 0518 01/19/15 0847  BP: 127/61 109/64 112/62 114/74  Pulse: 78 76 77 91  Temp: 97.7 F (36.5 C) 98.2 F (36.8 C) 97.5 F (36.4 C) 98.2 F (36.8 C)  TempSrc: Axillary Oral Oral Oral  Resp: 16 17 18 18   Height:  5\' 7"  (1.702 m)    Weight:  60.782 kg (134 lb)    SpO2: 99% 99% 100% 100%    Intake/Output Summary (Last 24 hours) at 01/19/15 1256 Last data filed at 01/19/15 0847  Gross per 24 hour  Intake    880 ml  Output    530 ml  Net    350 ml   Filed Weights   01/16/15 2113 01/17/15 2135 01/18/15 2229  Weight: 59.58 kg (131 lb 5.6 oz) 60 kg (132 lb 4.4 oz) 60.782 kg (134 lb)     Exam:  General exam:  Elderly frail male lying comfortably supine in bed. Scleral and skin icterus. Respiratory system: Clear. No increased work of breathing. Cardiovascular system: S1 & S2 heard, RRR. No JVD, murmurs, gallops, clicks or pedal edema. Gastrointestinal system: Abdomen is nondistended, soft and nontender. Normal bowel sounds heard.  Biliary drain + Central nervous system: Alert and oriented. No focal neurological deficits. Extremities: Symmetric 5 x 5 power.   Data Reviewed: Basic Metabolic Panel:  Recent Labs Lab 01/12/15 1440  01/14/15 0510 01/15/15 0500 01/16/15 0535 01/17/15 0519 01/19/15 0458  NA 127*  < > 130* 129* 127* 130* 132*  K 3.4*  < > 3.6 3.6 3.6 3.9 3.8  CL 94*  < > 94* 90* 90* 94* 96*  CO2 24  < > 26 27 25 27 25   GLUCOSE 147*  < > 132* 130* 149* 123* 128*  BUN 23*  < > 31* 35* 35* 27* 32*  CREATININE 1.46*  < > 1.68* 1.72* 1.57* 1.37* 1.44*  CALCIUM 8.1*  < > 8.4* 8.5* 8.5* 8.7* 8.7*  PHOS 3.7  --    --   --   --   --   --   < > = values in this interval not displayed. Liver Function Tests:  Recent Labs Lab 01/14/15 0510 01/15/15 0500 01/16/15 0535 01/17/15 0519 01/19/15 0458  AST 119* 115* 149* 189* 217*  ALT 107* 107* 114* 129* 163*  ALKPHOS 475* 422* 382* 376* 319*  BILITOT 22.2* 19.8* 19.4* 19.3* 15.5*  PROT 5.9* 5.6* 5.9* 5.5* 5.6*  ALBUMIN 1.9* 1.9* 1.8* 1.6* 1.6*   No results for input(s): LIPASE, AMYLASE in the last 168 hours. No results for input(s): AMMONIA in the last 168 hours. CBC:  Recent Labs Lab 01/14/15 0510 01/15/15 0500 01/17/15 0519 01/18/15 0512  WBC 11.7* 10.6* 9.9 12.3*  HGB 8.3* 8.2* 7.7* 7.9*  HCT 24.4* 23.8* 22.2* 22.6*  MCV 83.8 83.5 83.8 84.0  PLT 366 345 340 338   Cardiac Enzymes: No results for input(s): CKTOTAL, CKMB, CKMBINDEX, TROPONINI in the last 168 hours. BNP (last 3 results) No results for input(s): PROBNP in the last 8760 hours. CBG: No results for input(s): GLUCAP in the last  168 hours.  Recent Results (from the past 240 hour(s))  Culture, body fluid-bottle     Status: None   Collection Time: 01/15/15  1:55 PM  Result Value Ref Range Status   Specimen Description BILE  Final   Special Requests BAA 10MLS  Final   Gram Stain   Final    GRAM NEGATIVE RODS IN BOTH AEROBIC AND ANAEROBIC BOTTLES BUDDING YEAST SEEN IN BOTH AEROBIC AND ANAEROBIC BOTTLES CRITICAL RESULT CALLED TO, READ BACK BY AND VERIFIED WITH: Pollie Meyer RN [id number] 01/15/15 A BROWNING    Culture ENTEROBACTER SPECIES CANDIDA ALBICANS   Final   Report Status 01/18/2015 FINAL  Final   Organism ID, Bacteria ENTEROBACTER SPECIES  Final      Susceptibility   Enterobacter species - MIC*    CEFAZOLIN >=64 RESISTANT Resistant     CEFEPIME 2 SENSITIVE Sensitive     CEFTAZIDIME >=64 RESISTANT Resistant     CEFTRIAXONE >=64 RESISTANT Resistant     CIPROFLOXACIN <=0.25 SENSITIVE Sensitive     GENTAMICIN <=1 SENSITIVE Sensitive     IMIPENEM <=0.25 SENSITIVE Sensitive       TRIMETH/SULFA <=20 SENSITIVE Sensitive     PIP/TAZO >=128 RESISTANT Resistant     * ENTEROBACTER SPECIES  Gram stain     Status: None   Collection Time: 01/15/15  1:55 PM  Result Value Ref Range Status   Specimen Description BILE  Final   Special Requests NONE  Final   Gram Stain   Final    YEAST Gram Stain Report Called to,Read Back By and Verified With: A [facility] RN [id number] 01/15/15  A BROWNING    Report Status 01/15/2015 FINAL  Final           Studies: No results found.      Scheduled Meds: . ciprofloxacin  500 mg Oral BID  . enoxaparin (LOVENOX) injection  40 mg Subcutaneous Q24H  . fluconazole  200 mg Oral Q24H  . furosemide  20 mg Oral Daily  . metoprolol  25 mg Oral BID  . pantoprazole  40 mg Oral Daily  . sodium chloride  3 mL Intravenous Q12H  . sodium chloride  5 mL Intracatheter Q8H   Continuous Infusions:   Principal Problem:   Malignant obstructive jaundice Active Problems:   Dysphagia, oropharyngeal   Acid reflux   Essential (primary) hypertension   Hypercholesteremia   Cancer of tonsil (HCC)   Pancreatic mass   SIADH (syndrome of inappropriate ADH production) (HCC)   Anemia   Hyperbilirubinemia   Euthyroid sick syndrome   AKI (acute kidney injury) ([place])   Biliary obstruction   Pancreatic cancer ([place])   Malignant neoplasm of pancreas ([place])    Time spent: 20 minutes.    Vernell Leep, MD, FACP, FHM. [facility] Pager [phone]  If 7PM-7AM, please contact night-coverage [email or web address] Password [email or web address] 01/19/2015, 12:56 PM    LOS: 10 days

## 2015-01-19 NOTE — Care Management Important Message (Signed)
Important Message  Patient Details  Name: Geoffrey Gregory MRN: [id number] Date of Birth: 12-Dec-1932   Medicare Important Message Given:  Yes-fourth notification given    Delorse Lek 01/19/2015, 2:31 PM

## 2015-01-19 NOTE — Progress Notes (Signed)
Pharmacy Antibiotic Time-Out Note  Geoffrey Gregory is a 81 y.o. year-old male admitted on 01/09/2015.  The patient is currently on Fluconazole for biliary infection.  Assessment/Plan: The patient continues on Fluconazole for yeast in the biliary drainage and Ciprofloxacin for coverage of GNR identified as Enterobacter (sensitive to ciprofloxacin). No stop date has been set at this time.  Afebrile, WBC 9.9 >12.3K yesterday. SCr 1.37 >1.44, CrCl~30-35 ml/min. Current dose of fluconazole an cipro remain appropriate.   Plan 1. Continue Fluconazole 200 mg po every 24 hours 2. Start Cipro 500 mg po twice daily 3. Will continue to follow renal function, culture results, LOT, and antibiotic de-escalation plans    Recent Labs Lab 01/14/15 0510 01/15/15 0500 01/17/15 0519 01/18/15 0512  WBC 11.7* 10.6* 9.9 12.3*     Recent Labs Lab 01/14/15 0510 01/15/15 0500 01/16/15 0535 01/17/15 0519 01/19/15 0458  CREATININE 1.68* 1.72* 1.57* 1.37* 1.44*   Estimated Creatinine Clearance: 35.8 mL/min (by C-G formula based on Cr of 1.44).   Tmax/24h: Afebrile  Antimicrobial allergies: NKA  Antimicrobials this admission: Fluconazole 10/6 >> Cipro 10/7 >>  Levels/dose changes this admission: n/a  Microbiology Results: 10/6 Bile >> yeast + Enterobacter (sensitive to ciprofloxacin)  Thank you for allowing pharmacy to be a part of this patient's care.  Nicole Cella, RPh Clinical Pharmacist Pager: [phone] 01/19/2015 2:56 PM

## 2015-01-19 NOTE — Consult Note (Signed)
Chief Complaint: Patient was seen in consultation today for pancreatic cancer; biliary obstruction at the request of Dr Algis Liming  Referring Physician(s): [facility]; Dr Watt Climes  History of Present Illness: Geoffrey Gregory is a 81 y.o. male   Pancreatic cancer; bx proven Biliary drain placed 10/1 in IR Bx that day: +adenocarcinoma Oncology has recommended metal stent Request now for internalization; metal biliary stent per [facility] Dr Algis Liming and Dr Watt Climes Dr Barbie Banner has reviewed imaging and chart and has approved procedure for 10/12 I have seen and examined pt Total Bili 15.5 today (19.3)  Past Medical History  Diagnosis Date  . Hyperlipidemia   . Cancer [facility]) Feb 2015    tonsil, chemo/radiation  . Hypertension   . Diabetes mellitus without complication ([place])   . CAD (coronary artery disease)   . Vitamin D deficiency   . AAA (abdominal aortic aneurysm) [facility])     Past Surgical History  Procedure Laterality Date  . Portacath placement  Fe 2015    [name]  . Axillary lymph node biopsy Left 11-28-13    atyical squamous cells  . Carotid stent  1999  . Hernia repair Bilateral 10-06-00    inguinal/ Dr. [name]  . Lymph node dissection Left 12-30-13    axilla  . Endoscopic retrograde cholangiopancreatography (ercp) with propofol N/A 01/08/2015    Procedure: ENDOSCOPIC RETROGRADE CHOLANGIOPANCREATOGRAPHY (ERCP) WITH PROPOFOL;  Surgeon: Lucilla Lame, MD;  Location: [facility] ENDOSCOPY;  Service: Endoscopy;  Laterality: N/A;    Allergies: Review of patient's allergies indicates no known allergies.  Medications: Prior to Admission medications   Medication Sig Start Date End Date Taking? Authorizing Provider  acetaminophen (TYLENOL) 500 MG tablet Take 500 mg by mouth every 6 (six) hours as needed.   Yes Historical Provider, MD  aspirin 81 MG tablet Take by mouth daily. 06/08/11  Yes Historical Provider, MD  cholecalciferol (VITAMIN D) 1000 UNITS tablet Take 1,000 Units by mouth daily.   Yes Historical  Provider, MD  lovastatin (MEVACOR) 40 MG tablet Take 40 mg by mouth at bedtime.   Yes Historical Provider, MD  metoprolol (LOPRESSOR) 50 MG tablet Take 1 tablet (50 mg total) by mouth 2 (two) times daily. 10/17/14  Yes [name], MD  omeprazole (PRILOSEC) 40 MG capsule Take 40 mg by mouth daily.  12/11/13  Yes Historical Provider, MD     Family History  Problem Relation Age of Onset  . Diabetes Mother   . Heart disease Mother   . Stroke Sister   . Kidney disease Sister     kidney failure  . Hypertension Brother     Social History   Social History  . Marital Status: Married    Spouse Name: N/A  . Number of Children: N/A  . Years of Education: N/A   Social History Main Topics  . Smoking status: Former Smoker -- 1.00 packs/day for 15 years    Types: Cigarettes    Quit date: 04/11/1997  . Smokeless tobacco: Never Used  . Alcohol Use: No  . Drug Use: No  . Sexual Activity: Not Asked   Other Topics Concern  . None   Social History Narrative    Review of Systems: A 12 point ROS discussed and pertinent positives are indicated in the HPI above.  All other systems are negative.  Review of Systems  Constitutional: Positive for activity change, appetite change and fatigue. Negative for fever.  Respiratory: Negative for shortness of breath.   Gastrointestinal: Positive for  nausea. Negative for abdominal pain.  Neurological: Positive for weakness.  Psychiatric/Behavioral: Negative for behavioral problems and confusion.    Vital Signs: BP 114/74 mmHg  Pulse 91  Temp(Src) 98.2 F (36.8 C) (Oral)  Resp 18  Ht 5\' 7"  (1.702 m)  Wt 134 lb (60.782 kg)  BMI 20.98 kg/m2  SpO2 100%  Physical Exam  Cardiovascular: Normal rate and regular rhythm.   Pulmonary/Chest: Effort normal and breath sounds normal.  Abdominal: Soft. Bowel sounds are normal.  Site of Bili drain is clean and dry Output great Bilious color  Musculoskeletal: Normal range of motion.  Neurological:  He is alert.  Skin: Skin is warm and dry.  Psychiatric: He has a normal mood and affect. His behavior is normal. Judgment and thought content normal.  Nursing note and vitals reviewed.   Mallampati Score:  MD Evaluation Airway: WNL Heart: WNL Abdomen: WNL Chest/ Lungs: WNL ASA  Classification: 3 Mallampati/Airway Score: Two  Imaging: Ct Abdomen Pelvis Wo Contrast  01/16/2015   CLINICAL DATA:  Cancer ([place]) C80.1 (ICD-10-CM). Patient originally presented with painless jaundice, weakness and mellitus. Patient had a pancreatic MRI, not currently available for review, demonstrating a pancreatic mass obstructing the common bile duct. Patient with elevated bilirubin as high as 19. Also was a history of left tonsillar squamous cell carcinoma.  EXAM: CT CHEST, ABDOMEN AND PELVIS WITHOUT CONTRAST  TECHNIQUE: Multidetector CT imaging of the chest, abdomen and pelvis was performed following the standard protocol without IV contrast.  COMPARISON:  PET-CT, 11/11/2013.  Chest CT, 05/06/2014.  FINDINGS: CT CHEST FINDINGS  Thoracic inlet and axilla. There is left axillary adenopathy, largest node measuring 12 mm in short axis. There is intervening inflammatory type stranding. There is increased density in the retroareolar region of the left breast, most likely gynecomastia. Axillary adenopathy was present previously. Apparent gynecomastia is new. No neck base masses or adenopathy.  Mediastinum and hila: Focal bulge from the aortic arch is stable from the prior exam. Heart is normal in size. There are coronary artery calcifications, also stable. Mildly enlarged precarinal lymph node measuring 13 mm, previously subcentimeter. No hilar masses or adenopathy.  Lungs and pleura: 5 mm left lower lobe pulmonary nodule, image 45, series 4, with associated linear opacity. Nodule is new or increased in size. Linear opacity, likely scarring, stable. No other discrete nodules. Small right pleural effusion and minimal left  pleural effusion. Mild lung base linear/reticular opacity is noted consistent subsegmental atelectasis. No lung consolidation or edema. There are moderate changes of centrilobular emphysema.  CT ABDOMEN AND PELVIS FINDINGS  Percutaneous biliary stent extends from the upper anterior abdominal wall to the region of the porta hepatis into the common bile duct passing through the ampulla of Vater into the third portion of the duodenum. There is some intrahepatic biliary air. There is prominence of the pancreatic head. Is no defined mass. Abnormal hypo attenuating soft tissue or fluid extends along the gastrohepatic ligament. Mild inflammatory type stranding lies adjacent to the distal stomach and pancreatic head as well as the duodenum and along the anterior para renal fascia. A trace amount of ascites is seen along the anterior perirenal fascia, adjacent to the liver and along the right pericolic gutter extending into the posterior pelvic recess.  Liver: No mass or focal lesion. Low density consistent with edema extends along the portal branches.  Spleen:  Unremarkable.  Gallbladder: Dense material lies within the gallbladder. No evidence of acute cholecystitis.  Adrenal glands:  No masses.  Kidneys,  ureters, bladder: 2.9 cm low-density mass projects medially from the mid to lower pole left kidney, likely a cyst. No other renal masses. No collecting system stones. No hydronephrosis. Ureters normal in course and in caliber. Bladder is unremarkable.  Prostate gland:  Enlarged measuring 5.4 x 4.3 cm transversely.  Lymph nodes: Multiple prominent lymph nodes. These are most evident in the upper abdomen and in the peritoneal cavity. There is a right lower quadrant mesenteric node measuring 10 mm in short axis. Focal soft tissue nodules anterior to the distal stomach below the left lobe of the liver are noted which may reflect additional lymph nodes, largest measuring 11 mm in short axis. There is more a ill-defined soft  tissue nodularity in the teres celiac region extending toward the porta hepatis and pancreatic head likely also adenopathy.  Vascular: Patient has an aortic stent graft excluding and infrarenal abdominal aortic aneurysm. Native aneurysm measures 4.9 x 5.6 cm. No evidence of rupture.  Gastrointestinal: Mild prominence of the colon and loops small bowel several with air-fluid levels. A mild adynamic ileus is suspected. There is no bowel wall thickening. There is no evidence of obstruction. Uninflamed diverticula are noted along the left colon.  Musculoskeletal:  No osteoblastic or osteolytic lesions.  IMPRESSION: 1. Pancreatic head mass is not well-defined on the unenhanced images. There is pancreatic head fullness. Abnormal low attenuation soft tissue extends from the pancreatic head along the porta hepatis, which may reflect edema. Some of this may be infiltrating tumor. There is shotty adenopathy in the upper abdomen along the periceliac chain, gastrohepatic ligament and in the peripancreatic region. There is additional adenopathy in the peritoneal cavity most prominent in the right mid to lower quadrant. There is also abnormal soft tissue nodules in the anterior upper abdomen just below the left lobe of the liver, which may reflect additional adenopathy or peritoneal/omental carcinoma deposits. Peritoneal spread of carcinoma is suspected. Small amount of ascites. 2. Well-positioned biliary stent, percutaneously placed. No significant intrahepatic bile duct dilation. 3. Mild prominence of the colon small bowel. Mild adynamic ileus is suspected. No bowel obstruction or bowel inflammation. 4. Small right minimal left pleural effusions. 5. 5 mm left lower lobe pulmonary nodule. This could reflect neoplastic disease or be due to scarring. The nodule appears new or increased compared the prior CT. 6. Left axillary adenopathy similar to the prior CT. 7. Increased soft tissue in the retroareolar region of the left breast.  This most likely gynecomastia, but is new. Consider follow-up with mammography if this patient has a firm palpable lump in this region. 8. Chronic findings include emphysema, changes from aortic aneurysm surgery and prostatic enlargement.   Electronically Signed   By: Lajean Manes M.D.   On: 01/16/2015 15:19   Ct Soft Tissue Neck Wo Contrast  01/16/2015   CLINICAL DATA:  Personal history of pancreatic and tonsillar cancer. Tonsillar cancer was on the left. Interval follow-up.  EXAM: CT NECK WITHOUT CONTRAST  TECHNIQUE: Multidetector CT imaging of the neck was performed following the standard protocol without intravenous contrast.  COMPARISON:  05/06/2014 CT.  PET scan 11/11/2013  FINDINGS: No evidence of residual or recurrent tonsillar mass. Mild chronic scarring in the tonsillar region on the left as seen previously. The glottis is largely closed. This could be due to voluntary glottic closure. Glottic/supraglottic mass is not excluded given this appearance however.  Along the right side of the trachea in the upper thoracic region, there is abnormal material which could be mucous most  likely. Tracheal mass lesion is not excluded but not favored.  Surgical clips are present in the left neck related to previous node dissection or vascular surgery. No enlarged or low-density nodes are demonstrated.  Lung apices are clear. There is atherosclerosis of the aorta with a small focal aneurysm of the arch, not completely evaluated today but not visibly change since the previous study.  Submandibular glands and parotid glands are normal.  Ordinary cervical spondylosis.  IMPRESSION: Prominent tissue at the glottis/supraglottic region. This could be voluntary glottic closure. The possibility of a glottic mass does exist. Direct inspection would be advisable.  No evidence of recurrent disease in the left tonsillar region. No enlarged lymph nodes.  Abnormal material along the right side of the trachea in the upper thoracic  region, most likely representing mucus. The possibility of a mass does exist but is less likely.   Electronically Signed   By: Nelson Chimes M.D.   On: 01/16/2015 15:39   Ct Chest Wo Contrast  01/16/2015   CLINICAL DATA:  Cancer ([place]) C80.1 (ICD-10-CM). Patient originally presented with painless jaundice, weakness and mellitus. Patient had a pancreatic MRI, not currently available for review, demonstrating a pancreatic mass obstructing the common bile duct. Patient with elevated bilirubin as high as 19. Also was a history of left tonsillar squamous cell carcinoma.  EXAM: CT CHEST, ABDOMEN AND PELVIS WITHOUT CONTRAST  TECHNIQUE: Multidetector CT imaging of the chest, abdomen and pelvis was performed following the standard protocol without IV contrast.  COMPARISON:  PET-CT, 11/11/2013.  Chest CT, 05/06/2014.  FINDINGS: CT CHEST FINDINGS  Thoracic inlet and axilla. There is left axillary adenopathy, largest node measuring 12 mm in short axis. There is intervening inflammatory type stranding. There is increased density in the retroareolar region of the left breast, most likely gynecomastia. Axillary adenopathy was present previously. Apparent gynecomastia is new. No neck base masses or adenopathy.  Mediastinum and hila: Focal bulge from the aortic arch is stable from the prior exam. Heart is normal in size. There are coronary artery calcifications, also stable. Mildly enlarged precarinal lymph node measuring 13 mm, previously subcentimeter. No hilar masses or adenopathy.  Lungs and pleura: 5 mm left lower lobe pulmonary nodule, image 45, series 4, with associated linear opacity. Nodule is new or increased in size. Linear opacity, likely scarring, stable. No other discrete nodules. Small right pleural effusion and minimal left pleural effusion. Mild lung base linear/reticular opacity is noted consistent subsegmental atelectasis. No lung consolidation or edema. There are moderate changes of centrilobular emphysema.  CT  ABDOMEN AND PELVIS FINDINGS  Percutaneous biliary stent extends from the upper anterior abdominal wall to the region of the porta hepatis into the common bile duct passing through the ampulla of Vater into the third portion of the duodenum. There is some intrahepatic biliary air. There is prominence of the pancreatic head. Is no defined mass. Abnormal hypo attenuating soft tissue or fluid extends along the gastrohepatic ligament. Mild inflammatory type stranding lies adjacent to the distal stomach and pancreatic head as well as the duodenum and along the anterior para renal fascia. A trace amount of ascites is seen along the anterior perirenal fascia, adjacent to the liver and along the right pericolic gutter extending into the posterior pelvic recess.  Liver: No mass or focal lesion. Low density consistent with edema extends along the portal branches.  Spleen:  Unremarkable.  Gallbladder: Dense material lies within the gallbladder. No evidence of acute cholecystitis.  Adrenal glands:  No masses.  Kidneys, ureters, bladder: 2.9 cm low-density mass projects medially from the mid to lower pole left kidney, likely a cyst. No other renal masses. No collecting system stones. No hydronephrosis. Ureters normal in course and in caliber. Bladder is unremarkable.  Prostate gland:  Enlarged measuring 5.4 x 4.3 cm transversely.  Lymph nodes: Multiple prominent lymph nodes. These are most evident in the upper abdomen and in the peritoneal cavity. There is a right lower quadrant mesenteric node measuring 10 mm in short axis. Focal soft tissue nodules anterior to the distal stomach below the left lobe of the liver are noted which may reflect additional lymph nodes, largest measuring 11 mm in short axis. There is more a ill-defined soft tissue nodularity in the teres celiac region extending toward the porta hepatis and pancreatic head likely also adenopathy.  Vascular: Patient has an aortic stent graft excluding and infrarenal  abdominal aortic aneurysm. Native aneurysm measures 4.9 x 5.6 cm. No evidence of rupture.  Gastrointestinal: Mild prominence of the colon and loops small bowel several with air-fluid levels. A mild adynamic ileus is suspected. There is no bowel wall thickening. There is no evidence of obstruction. Uninflamed diverticula are noted along the left colon.  Musculoskeletal:  No osteoblastic or osteolytic lesions.  IMPRESSION: 1. Pancreatic head mass is not well-defined on the unenhanced images. There is pancreatic head fullness. Abnormal low attenuation soft tissue extends from the pancreatic head along the porta hepatis, which may reflect edema. Some of this may be infiltrating tumor. There is shotty adenopathy in the upper abdomen along the periceliac chain, gastrohepatic ligament and in the peripancreatic region. There is additional adenopathy in the peritoneal cavity most prominent in the right mid to lower quadrant. There is also abnormal soft tissue nodules in the anterior upper abdomen just below the left lobe of the liver, which may reflect additional adenopathy or peritoneal/omental carcinoma deposits. Peritoneal spread of carcinoma is suspected. Small amount of ascites. 2. Well-positioned biliary stent, percutaneously placed. No significant intrahepatic bile duct dilation. 3. Mild prominence of the colon small bowel. Mild adynamic ileus is suspected. No bowel obstruction or bowel inflammation. 4. Small right minimal left pleural effusions. 5. 5 mm left lower lobe pulmonary nodule. This could reflect neoplastic disease or be due to scarring. The nodule appears new or increased compared the prior CT. 6. Left axillary adenopathy similar to the prior CT. 7. Increased soft tissue in the retroareolar region of the left breast. This most likely gynecomastia, but is new. Consider follow-up with mammography if this patient has a firm palpable lump in this region. 8. Chronic findings include emphysema, changes from  aortic aneurysm surgery and prostatic enlargement.   Electronically Signed   By: Lajean Manes M.D.   On: 01/16/2015 15:19   Mr Abd W/wo Cm/mrcp  01/07/2015   CLINICAL DATA:  Biliary obstruction.  Jaundice  EXAM: MRI ABDOMEN WITHOUT AND WITH CONTRAST (INCLUDING MRCP)  TECHNIQUE: Multiplanar multisequence MR imaging of the abdomen was performed both before and after the administration of intravenous contrast. Heavily T2-weighted images of the biliary and pancreatic ducts were obtained, and three-dimensional MRCP images were rendered by post processing.  CONTRAST:  48mL MULTIHANCE GADOBENATE DIMEGLUMINE 529 MG/ML IV SOLN  COMPARISON:  05/06/2014  FINDINGS: Lower chest: Small bilateral pleural effusions are identified. New from previous study.  Hepatobiliary: No focal liver lesions identified. Marked intrahepatic bile duct dilatation. The extrahepatic common bile duct measured 1 cm. Abrupt cut off of the common bile duct is identified.  Pancreas:  There is an ill defined mass arising from the head of pancreas which measures approximately 4.1 x 3.6 x 4.6 cm. The mass completely encases the portal venous confluence with significant diminished caliber of the portal vein. Branches of the celiac trunk appear encased by this mass. The SMA appears patent an unaffected.  Spleen: Normal appearance of the spleen.  Adrenals/Urinary Tract: The adrenal glands are unremarkable. Bilateral renal cysts noted.  Stomach/Bowel: The stomach and the upper abdominal bowel loops are unremarkable.  Vascular/Lymphatic: Status post repair of infrarenal abdominal aortic aneurysm. The aneurysm sac measures 4.6 cm, image 7 of series 13. Previously 5.5 cm. No retroperitoneal adenopathy identified.  Other: There is a small amount of ascites identified within the upper abdomen.  Musculoskeletal: No specific findings to suggest bone metastasis.  IMPRESSION: 1. Large mass arising from the head of pancreas is identified worrisome for primary pancreatic  neoplasm. 2. There is complete obstruction of the common bile duct. The portal venous confluence is completely encased by the lesion. There is also involvement of a branches of the celiac trunk. 3. No specific findings identified to suggest liver metastasis.   Electronically Signed   By: Kerby Moors M.D.   On: 01/07/2015 17:53   Ir Int Lianne Cure Biliary Drain With Cholangiogram  01/10/2015   INDICATION: Obstructing pancreatic mass. Intra and extrahepatic biliary ductal dilatation. Remote history of tonsillar carcinoma with resultant stricturing, complicating attempts at endoscopy and intubation.  EXAM: ULTRASOUND AND FLUOROSCOIC GUIDED PERCUTANEOUS TRANSHEPATIC CHOLANGIOGRAM  INTERNAL/EXTERNAL BILIARY DRAIN TUBE PLACEMENT  BILIARY BRUSH BIOPSY  COMPARISON:  MR 01/07/2015 AND EARLIER STUDIES  MEDICATIONS: cefazolin 2g preprocedural  CONTRAST:  26mL OMNIPAQUE IOHEXOL 300 MG/ML  SOLN  ANESTHESIA/SEDATION: Intravenous Fentanyl and Versed were administered as conscious sedation during continuous cardiorespiratory monitoring by the radiology RN, with a total moderate sedation time of 32 minutes.  FLUOROSCOPY TIME:  8 minutes 24 seconds, 419   mGy.  COMPLICATIONS: None immediate  TECHNIQUE: Informed written consent was obtained from [employer] [employer] after a discussion of the risks, benefits and alternatives to treatment. Questions regarding the procedure were encouraged and answered. A timeout was performed prior to the initiation of the procedure.  The right upper abdominal quadrant was prepped and draped in the usual sterile fashion, and a sterile drape was applied covering the operative field. Maximum barrier sterile technique with sterile gowns and gloves were used for the procedure. A timeout was performed prior to the initiation of the procedure.  Ultrasound scanning of the right upper abdominal quadrant was performed to delineate the anatomy and avoid transgression of the gallbladder or the pleural. An entry site in  the right epigastric region was identified .  After the overlying soft tissues were anesthetized with 1% Lidocaine , a 21 gauge micropuncture needle was utilized to access the peripheral aspect of an anterior left hepatic duct. A 018 guidewire advanced centrally easily. A 3 French micro dilator was placed over the guidewire and contrast injection confirmed appropriate positioning in the biliary tree. The micro dilator was exchanged for a transitional dilator, exchanged for a Kumpe catheter over a Benson wire. With the use of a regular glide wire, the Kumpe catheter was advanced central to an apparent CBD structure, and ultimately into the distal duodenum.  Over an Amplatz stiff wire, the tract was dilated and a 9 [place] long peel-away sheath was advanced. The dilator was removed and biliary brush biopsy of the CBD obstructive region performed x2, submitted to cytology. The peel-away was then  exchanged for a 10.2 [place] biliary drainage catheter, advanced with coil ultimately locked within the duodenum. Contrast was injected and a completion radiograph was obtained. The catheter was connected to a drainage bag which yielded the brisk return of clear bile. The catheter was secured to the skin with an 0 Prolene suture and StatLock. The patient tolerated the procedure well without immediate postprocedural complication.  FINDINGS: The initial percutaneous transhepatic cholangiogram demonstrates marked intrahepatic biliary ductal dilatation. The proximal common duct just beyond the biliary confluence is patent. There is a long irregular stricture of the mid and distal CBD, with associated mucosal irregularity. The duodenum is decompressed.  Percutaneous biliary brush biopsy of the CBD lesion was performed x2.  10 French internal external biliary drain catheter was placed to the duodenum without complication.  IMPRESSION: 1. Percutaneous transhepatic cholangiogram demonstrating marked intrahepatic biliary ductal  dilatation, long segment CBD irregularity and narrowing. 2. Technically successful brush biopsy of CBD lesion. 3. Technically successful internal external biliary drain catheter placement. The catheter was left to external drainage to maximize biliary decompression.   Electronically Signed   By: Lucrezia Europe M.D.   On: 01/10/2015 13:00   Ir Exchange Biliary Drain  01/14/2015   CLINICAL DATA:  81 year old with pancreatic cancer and biliary obstruction. The patient has an internal/external biliary drain. Despite drain placement, the bilirubin has been increasing. Cholangiogram scheduled for evaluation of the tube and biliary system.  EXAM: CHOLANGIOGRAM THROUGH EXISTING CATHETER ; EXCHANGE OF PERCUTANEOUS BILIARY CATHETER WITH FLUOROSCOPY  Physician: Stephan Minister. Henn, MD  FLUOROSCOPY TIME:  1 minutes and 24 seconds, 36.6 mGy  MEDICATIONS: None  ANESTHESIA/SEDATION: Moderate sedation time: None  PROCEDURE: The procedure was explained to the patient. The risks and benefits of the procedure were discussed and the patient's questions were addressed. Informed consent was obtained from the patient. The patient was placed supine on the interventional table. Contrast was injected through the existing catheter.  The catheter and surrounding skin were prepped and draped in sterile fashion. Maximal barrier sterile technique was utilized including caps, mask, sterile gowns, sterile gloves, sterile drape, hand hygiene and skin antiseptic. The retention suture was removed. The catheter was cut and removed over a Bentson wire. A new 10 [place] biliary drain was advanced over the wire and the tip was positioned in the duodenum. Small amount of contrast was injected to confirm placement. Catheter was flushed with saline. A dressing was placed over the catheter site.  FINDINGS: The left internal/external biliary drain was stable in position from the prior examination. The intrahepatic ducts are decompressed and appears to be draining both  the left and right ducts. The distal aspect of the biliary drain was patent but there was sluggish flow and appeared to be partial occlusion. New catheter was positioned in the biliary system with the tip in the duodenum. New catheter is widely patent following contrast injection.  Estimated blood loss: None  CONTRAST:  10 mL Omnipaque 735  COMPLICATIONS: None  IMPRESSION: The biliary drain was patent but there was sluggish flow through the distal component. Therefore, the biliary drain was exchanged. Continue to follow liver enzymes.   Electronically Signed   By: Markus Daft M.D.   On: 01/14/2015 10:34   Ir Endoluminal Bx Of Biliary Tree  01/10/2015   INDICATION: Obstructing pancreatic mass. Intra and extrahepatic biliary ductal dilatation. Remote history of tonsillar carcinoma with resultant stricturing, complicating attempts at endoscopy and intubation.  EXAM: ULTRASOUND AND FLUOROSCOIC GUIDED PERCUTANEOUS TRANSHEPATIC CHOLANGIOGRAM  INTERNAL/EXTERNAL BILIARY  DRAIN TUBE PLACEMENT  BILIARY BRUSH BIOPSY  COMPARISON:  MR 01/07/2015 AND EARLIER STUDIES  MEDICATIONS: cefazolin 2g preprocedural  CONTRAST:  16mL OMNIPAQUE IOHEXOL 300 MG/ML  SOLN  ANESTHESIA/SEDATION: Intravenous Fentanyl and Versed were administered as conscious sedation during continuous cardiorespiratory monitoring by the radiology RN, with a total moderate sedation time of 32 minutes.  FLUOROSCOPY TIME:  8 minutes 24 seconds, 419   mGy.  COMPLICATIONS: None immediate  TECHNIQUE: Informed written consent was obtained from Evern Bio after a discussion of the risks, benefits and alternatives to treatment. Questions regarding the procedure were encouraged and answered. A timeout was performed prior to the initiation of the procedure.  The right upper abdominal quadrant was prepped and draped in the usual sterile fashion, and a sterile drape was applied covering the operative field. Maximum barrier sterile technique with sterile gowns and gloves  were used for the procedure. A timeout was performed prior to the initiation of the procedure.  Ultrasound scanning of the right upper abdominal quadrant was performed to delineate the anatomy and avoid transgression of the gallbladder or the pleural. An entry site in the right epigastric region was identified .  After the overlying soft tissues were anesthetized with 1% Lidocaine , a 21 gauge micropuncture needle was utilized to access the peripheral aspect of an anterior left hepatic duct. A 018 guidewire advanced centrally easily. A 3 French micro dilator was placed over the guidewire and contrast injection confirmed appropriate positioning in the biliary tree. The micro dilator was exchanged for a transitional dilator, exchanged for a Kumpe catheter over a Benson wire. With the use of a regular glide wire, the Kumpe catheter was advanced central to an apparent CBD structure, and ultimately into the distal duodenum.  Over an Amplatz stiff wire, the tract was dilated and a 9 [place] long peel-away sheath was advanced. The dilator was removed and biliary brush biopsy of the CBD obstructive region performed x2, submitted to cytology. The peel-away was then exchanged for a 10.2 [place] biliary drainage catheter, advanced with coil ultimately locked within the duodenum. Contrast was injected and a completion radiograph was obtained. The catheter was connected to a drainage bag which yielded the brisk return of clear bile. The catheter was secured to the skin with an 0 Prolene suture and StatLock. The patient tolerated the procedure well without immediate postprocedural complication.  FINDINGS: The initial percutaneous transhepatic cholangiogram demonstrates marked intrahepatic biliary ductal dilatation. The proximal common duct just beyond the biliary confluence is patent. There is a long irregular stricture of the mid and distal CBD, with associated mucosal irregularity. The duodenum is decompressed.  Percutaneous  biliary brush biopsy of the CBD lesion was performed x2.  10 French internal external biliary drain catheter was placed to the duodenum without complication.  IMPRESSION: 1. Percutaneous transhepatic cholangiogram demonstrating marked intrahepatic biliary ductal dilatation, long segment CBD irregularity and narrowing. 2. Technically successful brush biopsy of CBD lesion. 3. Technically successful internal external biliary drain catheter placement. The catheter was left to external drainage to maximize biliary decompression.   Electronically Signed   By: Lucrezia Europe M.D.   On: 01/10/2015 13:00   US Abdomen Limited Ruq  01/07/2015   CLINICAL DATA:  Elevated liver function studies, history of diabetes, alcohol dependence in remission, coronary artery disease.  EXAM: US ABDOMEN LIMITED - RIGHT UPPER QUADRANT  COMPARISON:  Abdominal pelvic CT scan of December 20, 2013  FINDINGS: Gallbladder:  The gallbladder is adequately distended. There is echogenic bile  versus tiny floating stones within the gallbladder lumen. There is no gallbladder wall thickening, pericholecystic fluid, or positive sonographic Murphy's sign.  Common bile duct:  Diameter: 10 mm  Liver:  The intrahepatic ducts are dilated. No discrete hepatic mass is observed. There is a hypoechoic mass associated with the inferior aspect of the pancreatic head. There is ascites.  IMPRESSION: Suspicious masslike lesion in the region of the pancreatic head resulting in biliary ductal dilation. Possible tiny gallstones versus echogenic bile.  Abdominal MRI or CT scanning is recommended.   Electronically Signed   By: David  Martinique M.D.   On: 01/07/2015 14:03    Labs:  CBC:  Recent Labs  01/14/15 0510 01/15/15 0500 01/17/15 0519 01/18/15 0512  WBC 11.7* 10.6* 9.9 12.3*  HGB 8.3* 8.2* 7.7* 7.9*  HCT 24.4* 23.8* 22.2* 22.6*  PLT 366 345 340 338    COAGS:  Recent Labs  03/13/14 0425 01/09/15 1417 01/09/15 2108  INR 1.1 1.08 1.19  APTT 27.7 25   --     BMP:  Recent Labs  01/15/15 0500 01/16/15 0535 01/17/15 0519 01/19/15 0458  NA 129* 127* 130* 132*  K 3.6 3.6 3.9 3.8  CL 90* 90* 94* 96*  CO2 27 25 27 25   GLUCOSE 130* 149* 123* 128*  BUN 35* 35* 27* 32*  CALCIUM 8.5* 8.5* 8.7* 8.7*  CREATININE 1.72* 1.57* 1.37* 1.44*  GFRNONAA 36* 40* 47* 45*  GFRAA 42* 47* 55* 52*    LIVER FUNCTION TESTS:  Recent Labs  01/15/15 0500 01/16/15 0535 01/17/15 0519 01/19/15 0458  BILITOT 19.8* 19.4* 19.3* 15.5*  AST 115* 149* 189* 217*  ALT 107* 114* 129* 163*  ALKPHOS 422* 382* 376* 319*  PROT 5.6* 5.9* 5.5* 5.6*  ALBUMIN 1.9* 1.8* 1.6* 1.6*    TUMOR MARKERS:  Recent Labs  01/07/15 1953  CA199 418*    Assessment and Plan:  Panc cancer Biliary obstruction Biliary drain placed 10/1--upsized 10/5 TB 15.5--decreasing barely Now scheduled for metal biliary stent placement in IR On schedule for 10/12 Pt aware and agreeable Understands procedure benefits and risks including but not limited to Infection;  bleeding; damage to surrounding structures   Thank you for this interesting consult.  I greatly enjoyed meeting CLERENCE GUBSER and look forward to participating in their care.  A copy of this report was sent to the requesting provider on this date.  Signed: Finis Hendricksen A 01/19/2015, 2:15 PM   I spent a total of 40 Minutes    in face to face in clinical consultation, greater than 50% of which was counseling/coordinating care for internalization biliary stent

## 2015-01-19 NOTE — Progress Notes (Signed)
Evern Bio 9:47 AM  Subjective: Patient seen and examined and case discussed with the patient and his wife my partner Dr. Penelope Coop and the [facility] gastroenterologist Dr. Allen Norris and he is currently not having any complaints and is eating fine and his attempt at ERCP was not done because he was unable to be intubated in [facility] and it was an anesthesia problem and no attempt by GI to pass the scope was undertaken  Objective: Vital signs stable afebrile no acute distress exam pertinent for his abdomen being soft nontender CT and MRI and interventional studies reviewed bili slight decrease to 15  Assessment: Pancreatic mass with bile duct obstruction  Plan: I believe interventional radiology can place an internal metal stent which should have the same success as our placing a internal stent as well but I am willing to try if they feel that I would have better success otherwise will be on standby to help when necessary and once an internal stent is placed they can remove the external drain at the appropriate time  [facility] E  Pager [phone] After 5PM or if no answer call [phone]

## 2015-01-20 LAB — COMPREHENSIVE METABOLIC PANEL
ALBUMIN: 1.6 g/dL — AB (ref 3.5–5.0)
ALK PHOS: 297 U/L — AB (ref 38–126)
ALT: 178 U/L — AB (ref 17–63)
ANION GAP: 9 (ref 5–15)
AST: 230 U/L — ABNORMAL HIGH (ref 15–41)
BUN: 34 mg/dL — ABNORMAL HIGH (ref 6–20)
CALCIUM: 8.6 mg/dL — AB (ref 8.9–10.3)
CHLORIDE: 95 mmol/L — AB (ref 101–111)
CO2: 26 mmol/L (ref 22–32)
CREATININE: 1.44 mg/dL — AB (ref 0.61–1.24)
GFR calc Af Amer: 52 mL/min — ABNORMAL LOW (ref >60)
GFR calc non Af Amer: 45 mL/min — ABNORMAL LOW (ref >60)
GLUCOSE: 127 mg/dL — AB (ref 65–99)
Potassium: 4 mmol/L (ref 3.5–5.1)
SODIUM: 130 mmol/L — AB (ref 135–145)
Total Bilirubin: 14 mg/dL — ABNORMAL HIGH (ref 0.3–1.2)
Total Protein: 5.8 g/dL — ABNORMAL LOW (ref 6.5–8.1)

## 2015-01-20 NOTE — Progress Notes (Signed)
PROGRESS NOTE    Geoffrey Gregory ZJI:[id number] DOB: 16-Jan-1933 DOA: 01/09/2015 PCP: [name], MD  HPI/Brief narrative 81 yo bm transferred from [facility] for biliary drain placement by IR and further workup of pancreatic mass. Was admitted there with sodium of 111 felt to be due to SIADH, jaundice and pancreatic mass. 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 Transferring physician reportedly spoke with interventional radiology and gastroenterology, although it's unclear which gastroenterologist was contacted. Had been on hypertonic saline and hydrochlorothiazide- stopped. Internal and external biliary drain placed. 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 After biliary drain was upsized, total bilirubin transiently dropped from 22-19 but has plateaued. Medical Oncology and [facility] GI consulted. Unable to do ERCP d/t to difficult intubation. Awaiting stent internalization by IR 10/12. Could DC after that pending IR clearance and OP follow up with his primary Onc in [place] and IR as OP.  Assessment/Plan:  Principal Problem:  Malignant obstructive jaundice:  - Status post external and internal biliary drain placed by IR.  - Bilirubin decreased initially then started climbing.  - Dr. [name] discussed with IR & with [name], Oncologist in [place] -His navigator will arrange f/u after patient discharged -  Cholangiogram 10/5 through existing tube demonstrated partial occlusion of the train and the drain was successfully exchanged for a new 10 French catheter -  After biliary drain was upsized, total bilirubin transiently dropped from 22 >19 but has plateaued. Discussed with Oncology (Dr. Metro Kung patner) on 10/7 recommended inpatient oncology consultation. Discussed with Dr. Alvy Bimler who recommended CT neck, chest, abdomen and pelvis without  contrast to assess for spread. - Possible biliary infection with yeast and Enterobacter: Started Cipro 10/7 and fluconazole 10/6 : duration of Rx ? 2 weeks. - Oncology recommended GI consultation for possible ERCP. GI follow-up 10/10 appreciated: Recommend IR to place internal metal stent and indicated that it should have the same success as ERCP for internal stenting. Discussed with oncology. - Discussed with IR on 10/10 and plans for internalization of stent on 10/12. - Bilirubin has gradually decreased to 14.  Active Problems:  Pancreatic mass:  - See above   SIADH (syndrome of inappropriate ADH production):  - Sodium had gradually improved after fluid restriction and Lasix. Lasix was temporarily stopped for 2 days due to worsening creatinine. Sodium has again started to drop. Discussed with Dr. Jimmy Footman, nephrology on 10/7: Recommends resuming Lasix 20 mg orally daily and we'll have to accept increased creatinine in the 1.5-2 range. Prognosis overall poor. - Lasix resumed. Sodium improved and stable and 130 range   Acid reflux:  - On PPI   Essential (primary) hypertension:  - Continue metoprolol. Would not resume thiazide. controlled   Hypercholesteremia   Cancer of tonsil:  - Status post chemoradiation, in remission per oncology note.    Anemia:  - No gross bleeding. anemia panel with minimally low iron at 41, but normal ferritin. Hb gradually dropping. Consider transfusion if hemoglobin less than 7 g per DL. Hemoglobin 7.9.   Euthyroid sick syndrome:  - Would repeat thyroid function tests in 4-6 weeks  Hypokalemia:  - Repleted by mouth.  AKI:  - See above. Lasix temporarily held due to worsening renal function. Creatinine has improved slightly. Lasix resumed. Creatinine stable in the 1.3-1.4 range   Type II DM -  Reasonably controlled. Not on meds currently  DVT prophylaxis: SCDs & Lovenox added 10/7. Code Status:  full Family Communication:  Discussed  with spouse at bedside on 10/11 Disposition Plan:  DC home when medically stable-pending internalization of biliary stent and IR clearance.   Consultants:   Nephrology   Interventional radiology  Medical oncology.  [facility]-signed off 10/10  Procedures:   upsizing of biliary drain by IR on 10/5  Antibiotics:   IV ceftezolin 1 dose on 10/1   IV Zosyn 1 dose on 10/5   Oral Cipro 10/7 >  Oral fluconazole 10/7 >  Subjective: Denies complaints.  Objective: Filed Vitals:   01/19/15 1723 01/19/15 2111 01/20/15 0504 01/20/15 0842  BP: 121/56 133/67 120/61 116/57  Pulse: 80 83 79 82  Temp: 98.1 F (36.7 C) 97.8 F (36.6 C) 98.2 F (36.8 C) 98.5 F (36.9 C)  TempSrc: Oral Oral Oral Oral  Resp: 18 16 16 16   Height:      Weight:  60.694 kg (133 lb 12.9 oz)    SpO2: 100% 98% 98% 99%    Intake/Output Summary (Last 24 hours) at 01/20/15 1623 Last data filed at 01/20/15 1300  Gross per 24 hour  Intake    720 ml  Output   2025 ml  Net  -1305 ml   Filed Weights   01/17/15 2135 01/18/15 2229 01/19/15 2111  Weight: 60 kg (132 lb 4.4 oz) 60.782 kg (134 lb) 60.694 kg (133 lb 12.9 oz)     Exam:  General exam:  Elderly frail male lying comfortably sitting up in chair. Scleral and skin icterus-improving. Respiratory system: Clear. No increased work of breathing. Cardiovascular system: S1 & S2 heard, RRR. No JVD, murmurs, gallops, clicks or pedal edema. Gastrointestinal system: Abdomen is nondistended, soft and nontender. Normal bowel sounds heard.  Biliary drain + Central nervous system: Alert and oriented. No focal neurological deficits. Extremities: Symmetric 5 x 5 power.   Data Reviewed: Basic Metabolic Panel:  Recent Labs Lab 01/15/15 0500 01/16/15 0535 01/17/15 0519 01/19/15 0458 01/20/15 0451  NA 129* 127* 130* 132* 130*  K 3.6 3.6 3.9 3.8 4.0  CL 90* 90* 94* 96* 95*  CO2 27 25 27 25 26   GLUCOSE 130* 149* 123* 128* 127*  BUN 35* 35* 27* 32* 34*    CREATININE 1.72* 1.57* 1.37* 1.44* 1.44*  CALCIUM 8.5* 8.5* 8.7* 8.7* 8.6*   Liver Function Tests:  Recent Labs Lab 01/15/15 0500 01/16/15 0535 01/17/15 0519 01/19/15 0458 01/20/15 0451  AST 115* 149* 189* 217* 230*  ALT 107* 114* 129* 163* 178*  ALKPHOS 422* 382* 376* 319* 297*  BILITOT 19.8* 19.4* 19.3* 15.5* 14.0*  PROT 5.6* 5.9* 5.5* 5.6* 5.8*  ALBUMIN 1.9* 1.8* 1.6* 1.6* 1.6*   No results for input(s): LIPASE, AMYLASE in the last 168 hours. No results for input(s): AMMONIA in the last 168 hours. CBC:  Recent Labs Lab 01/14/15 0510 01/15/15 0500 01/17/15 0519 01/18/15 0512  WBC 11.7* 10.6* 9.9 12.3*  HGB 8.3* 8.2* 7.7* 7.9*  HCT 24.4* 23.8* 22.2* 22.6*  MCV 83.8 83.5 83.8 84.0  PLT 366 345 340 338   Cardiac Enzymes: No results for input(s): CKTOTAL, CKMB, CKMBINDEX, TROPONINI in the last 168 hours. BNP (last 3 results) No results for input(s): PROBNP in the last 8760 hours. CBG: No results for input(s): GLUCAP in the last 168 hours.  Recent Results (from the past 240 hour(s))  Culture, body fluid-bottle     Status: None   Collection Time: 01/15/15  1:55 PM  Result Value Ref Range Status   Specimen Description BILE  Final   Special Requests BAA 10MLS  Final   Gram Stain   Final    GRAM NEGATIVE RODS IN BOTH AEROBIC AND ANAEROBIC BOTTLES BUDDING YEAST SEEN IN BOTH AEROBIC AND ANAEROBIC BOTTLES CRITICAL RESULT CALLED TO, READ BACK BY AND VERIFIED WITH: Pollie Meyer RN [id number] 01/15/15 A BROWNING    Culture ENTEROBACTER SPECIES CANDIDA ALBICANS   Final   Report Status 01/18/2015 FINAL  Final   Organism ID, Bacteria ENTEROBACTER SPECIES  Final      Susceptibility   Enterobacter species - MIC*    CEFAZOLIN >=64 RESISTANT Resistant     CEFEPIME 2 SENSITIVE Sensitive     CEFTAZIDIME >=64 RESISTANT Resistant     CEFTRIAXONE >=64 RESISTANT Resistant     CIPROFLOXACIN <=0.25 SENSITIVE Sensitive     GENTAMICIN <=1 SENSITIVE Sensitive     IMIPENEM <=0.25 SENSITIVE  Sensitive     TRIMETH/SULFA <=20 SENSITIVE Sensitive     PIP/TAZO >=128 RESISTANT Resistant     * ENTEROBACTER SPECIES  Gram stain     Status: None   Collection Time: 01/15/15  1:55 PM  Result Value Ref Range Status   Specimen Description BILE  Final   Special Requests NONE  Final   Gram Stain   Final    YEAST Gram Stain Report Called to,Read Back By and Verified With: A [facility] RN [id number] 01/15/15  A BROWNING    Report Status 01/15/2015 FINAL  Final           Studies: No results found.      Scheduled Meds: . ciprofloxacin  500 mg Oral BID  . [START ON 01/22/2015] enoxaparin (LOVENOX) injection  40 mg Subcutaneous Q24H  . fluconazole  200 mg Oral Q24H  . furosemide  20 mg Oral Daily  . metoprolol  25 mg Oral BID  . pantoprazole  40 mg Oral Daily  . [START ON 01/21/2015] piperacillin-tazobactam (ZOSYN)  IV  3.375 g Intravenous to XRAY  . sodium chloride  3 mL Intravenous Q12H  . sodium chloride  5 mL Intracatheter Q8H   Continuous Infusions:   Principal Problem:   Malignant obstructive jaundice Active Problems:   Dysphagia, oropharyngeal   Acid reflux   Essential (primary) hypertension   Hypercholesteremia   Cancer of tonsil (HCC)   Pancreatic mass   SIADH (syndrome of inappropriate ADH production) (HCC)   Anemia   Hyperbilirubinemia   Euthyroid sick syndrome   AKI (acute kidney injury) ([place])   Biliary obstruction   Pancreatic cancer ([place])   Malignant neoplasm of pancreas ([place])    Time spent: 20 minutes.    Vernell Leep, MD, FACP, FHM. [facility] Pager [phone]  If 7PM-7AM, please contact night-coverage [email or web address] Password [email or web address] 01/20/2015, 4:23 PM    LOS: 11 days

## 2015-01-20 NOTE — Progress Notes (Signed)
Evern Bio 9:17 AM  Subjective: Patient without any further complaints awaiting stent internalization tomorrow and I discussed his case with the gastroenterologist at [facility] who said the problem was that anesthesia could not intubate the patient despite 45 minute attempt and I told the patient that although his wife was not present Objective: Vital signs stable afebrile no acute distress abdomen is soft nontender bilirubin trending downward  Assessment: Obstructive jaundice  Plan: Will follow at a distance please let us know if we could be of any further assistance and await IR intervention tomorrow  [facility] E  Pager [phone] After 5PM or if no answer call [phone]

## 2015-01-21 DIAGNOSIS — N179 Acute kidney failure, unspecified: Secondary | ICD-10-CM

## 2015-01-21 DIAGNOSIS — K838 Other specified diseases of biliary tract: Secondary | ICD-10-CM

## 2015-01-21 DIAGNOSIS — K831 Obstruction of bile duct: Secondary | ICD-10-CM

## 2015-01-21 DIAGNOSIS — I10 Essential (primary) hypertension: Secondary | ICD-10-CM

## 2015-01-21 LAB — CBC
HCT: 23.5 % — ABNORMAL LOW (ref 39.0–52.0)
HEMOGLOBIN: 8 g/dL — AB (ref 13.0–17.0)
MCH: 28.8 pg (ref 26.0–34.0)
MCHC: 34 g/dL (ref 30.0–36.0)
MCV: 84.5 fL (ref 78.0–100.0)
Platelets: 379 10*3/uL (ref 150–400)
RBC: 2.78 MIL/uL — AB (ref 4.22–5.81)
RDW: 21.1 % — ABNORMAL HIGH (ref 11.5–15.5)
WBC: 15.3 10*3/uL — AB (ref 4.0–10.5)

## 2015-01-21 LAB — COMPREHENSIVE METABOLIC PANEL
ALK PHOS: 291 U/L — AB (ref 38–126)
ALT: 190 U/L — AB (ref 17–63)
AST: 253 U/L — AB (ref 15–41)
Albumin: 1.6 g/dL — ABNORMAL LOW (ref 3.5–5.0)
Anion gap: 10 (ref 5–15)
BUN: 30 mg/dL — AB (ref 6–20)
CALCIUM: 8.5 mg/dL — AB (ref 8.9–10.3)
CO2: 24 mmol/L (ref 22–32)
CREATININE: 1.45 mg/dL — AB (ref 0.61–1.24)
Chloride: 94 mmol/L — ABNORMAL LOW (ref 101–111)
GFR, EST AFRICAN AMERICAN: 51 mL/min — AB (ref >60)
GFR, EST NON AFRICAN AMERICAN: 44 mL/min — AB (ref >60)
Glucose, Bld: 130 mg/dL — ABNORMAL HIGH (ref 65–99)
Potassium: 4.1 mmol/L (ref 3.5–5.1)
Sodium: 128 mmol/L — ABNORMAL LOW (ref 135–145)
Total Bilirubin: 13.2 mg/dL — ABNORMAL HIGH (ref 0.3–1.2)
Total Protein: 5.6 g/dL — ABNORMAL LOW (ref 6.5–8.1)

## 2015-01-21 LAB — APTT: aPTT: 26 seconds (ref 24–37)

## 2015-01-21 LAB — PROTIME-INR
INR: 1.15 (ref 0.00–1.49)
PROTHROMBIN TIME: 14.9 s (ref 11.6–15.2)

## 2015-01-21 MED ORDER — SODIUM CHLORIDE 1 G PO TABS
1.0000 g | ORAL_TABLET | Freq: Two times a day (BID) | ORAL | Status: DC
  Administered 2015-01-21 – 2015-01-22 (×3): 1 g via ORAL
  Filled 2015-01-21 (×4): qty 1

## 2015-01-21 MED ORDER — SODIUM CHLORIDE 0.9 % IV SOLN
INTRAVENOUS | Status: DC
Start: 2015-01-21 — End: 2015-01-22
  Administered 2015-01-21: 07:00:00 via INTRAVENOUS

## 2015-01-21 NOTE — Progress Notes (Signed)
[facility] PROGRESS NOTE  Geoffrey Gregory GYJ:[id number] DOB: 1932/04/30 DOA: 01/09/2015 PCP: [name], MD  HPI/Brief narrative 81 yo bm transferred from [facility] for biliary drain placement by IR and further workup of pancreatic mass. Was admitted there with sodium of 111 felt to be due to SIADH, jaundice and pancreatic mass. 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 Transferring physician reportedly spoke with interventional radiology and gastroenterology, although it's unclear which gastroenterologist was contacted. Had been on hypertonic saline and hydrochlorothiazide- stopped. Internal and external biliary drain placed. 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 After biliary drain was upsized, total bilirubin transiently dropped from 22-19 but has plateaued. Medical Oncology and [facility] consulted. Unable to do ERCP d/t to difficult intubation. Awaiting stent internalization by IR 10/12. Could DC after that pending IR clearance and OP follow up with his primary Onc in [place] and IR as OP.  Assessment/Plan: Principal Problem:  Malignant obstructive jaundice:  - Status post external and internal biliary drain placed by IR.  - Bilirubin decreased initially then started climbing.  - Dr. [name] had discussed case with IR & with [name], patient's Oncologist in [place] -His navigator will arrange f/u after patient discharged - Cholangiogram 10/5 through existing tube demonstrated partial occlusion of the train and the drain was successfully exchanged for a new 10 French catheter - After biliary drain was upsized, total bilirubin transiently dropped from 22 >19 but has since plateaued. Discussed with Oncology (Dr. Metro Kung patner) on 10/7 recommended inpatient oncology consultation. Discussed with Dr. Alvy Bimler who recommended CT neck,  chest, abdomen and pelvis without contrast to assess for spread. - Possible biliary infection with yeast and Enterobacter: Started Cipro 10/7 and fluconazole 10/6 : duration of Rx ? 2 weeks. - Oncology recommended GI consultation for possible ERCP. GI follow-up 10/10 appreciated: Recommend IR to place internal metal stent and indicated that it should have the same success as ERCP for internal stenting. Case was discussed with oncology. - Case was discussed with IR on 10/10 with plans for internalization of stent on 10/12, pending. - Bilirubin is trending down, currently 13.  Active Problems:  Pancreatic mass:  - Per above   SIADH (syndrome of inappropriate ADH production):  - Sodium had gradually improved after fluid restriction and Lasix. Lasix was temporarily stopped for 2 days due to worsening creatinine. Sodium has again started to drop. Discussed with Dr. Jimmy Footman, nephrology on 10/7: Recommends resuming Lasix 20 mg orally daily and we'll have to accept increased creatinine in the 1.5-2 range. Prognosis overall poor. - Lasix was resumed. Sodium dropped somewhat to 128 this AM - Given elevated LFT's would avoid tolvaptan  - Will start a trial of salt tabs and check bmet in AM   Acid reflux:  - On PPI   Essential (primary) hypertension:  - Continue metoprolol. Would not resume thiazide. controlled   Hypercholesteremia   Cancer of tonsil:  - Status post chemoradiation, in remission per oncology note.    Anemia:  - No gross bleeding. anemia panel with minimally low iron at 41, but normal ferritin. Hb gradually dropping. Consider transfusion if hemoglobin less than 7 g per DL. Hemoglobin 8   Euthyroid sick syndrome:  - Would repeat thyroid function tests in 4-6 weeks  Hypokalemia:  - Repleted by mouth.  AKI:  - See above. Lasix temporarily held due to worsening renal function. Creatinine  has improved slightly. Lasix resumed. Creatinine stable in the 1.3-1.4  range  Type II DM - Reasonably controlled. Not on meds currently  Code Status: Full Family Communication: Pt in room Disposition Plan: Pending   Consultants:  Nephrology  Interventional radiology  Medical oncology.  [facility]-signed off 10/10  Procedures:  upsizing of biliary drain by IR on 10/5  Antibiotics: Anti-infectives    Start     Dose/Rate Route Frequency Ordered Stop   01/21/15 0600  piperacillin-tazobactam (ZOSYN) IVPB 3.375 g    Comments:  To Radiology 10/12 for procedure   3.375 g 100 mL/hr over 30 Minutes Intravenous To Radiology 01/19/15 1412 01/22/15 0600   01/16/15 1600  ciprofloxacin (CIPRO) tablet 500 mg     500 mg Oral 2 times daily 01/16/15 1458     01/15/15 1700  fluconazole (DIFLUCAN) tablet 200 mg     200 mg Oral Every 24 hours 01/15/15 1649     01/14/15 1300  piperacillin-tazobactam (ZOSYN) IVPB 3.375 g     3.375 g 12.5 mL/hr over 240 Minutes Intravenous  Once 01/13/15 1213 01/14/15 1725   01/10/15 1215  ceFAZolin (ANCEF) IVPB 2 g/50 mL premix    Comments:  Already in IR   2 g 100 mL/hr over 30 Minutes Intravenous  Once 01/10/15 1208 01/10/15 1244      HPI/Subjective: No complaints this AM. Pt in good spirits  Objective: Filed Vitals:   01/20/15 2057 01/21/15 0424 01/21/15 0847 01/21/15 1057  BP: 115/66 118/61 124/70 130/67  Pulse: 84 82 87 91  Temp: 97.9 F (36.6 C) 98.2 F (36.8 C) 97.8 F (36.6 C)   TempSrc: Oral Oral Oral   Resp: 16 16 16    Height:      Weight:      SpO2: 100% 99% 99%     Intake/Output Summary (Last 24 hours) at 01/21/15 1625 Last data filed at 01/21/15 1520  Gross per 24 hour  Intake    255 ml  Output   1350 ml  Net  -1095 ml   Filed Weights   01/17/15 2135 01/18/15 2229 01/19/15 2111  Weight: 60 kg (132 lb 4.4 oz) 60.782 kg (134 lb) 60.694 kg (133 lb 12.9 oz)    Exam:   General:  Awake, in nad  Cardiovascular: regular, s1, s2  Respiratory: normal resp effort, no wheezing  Abdomen:  soft,nondistended  Musculoskeletal: perfused, no clubbing   Data Reviewed: Basic Metabolic Panel:  Recent Labs Lab 01/16/15 0535 01/17/15 0519 01/19/15 0458 01/20/15 0451 01/21/15 0517  NA 127* 130* 132* 130* 128*  K 3.6 3.9 3.8 4.0 4.1  CL 90* 94* 96* 95* 94*  CO2 25 27 25 26 24   GLUCOSE 149* 123* 128* 127* 130*  BUN 35* 27* 32* 34* 30*  CREATININE 1.57* 1.37* 1.44* 1.44* 1.45*  CALCIUM 8.5* 8.7* 8.7* 8.6* 8.5*   Liver Function Tests:  Recent Labs Lab 01/16/15 0535 01/17/15 0519 01/19/15 0458 01/20/15 0451 01/21/15 0517  AST 149* 189* 217* 230* 253*  ALT 114* 129* 163* 178* 190*  ALKPHOS 382* 376* 319* 297* 291*  BILITOT 19.4* 19.3* 15.5* 14.0* 13.2*  PROT 5.9* 5.5* 5.6* 5.8* 5.6*  ALBUMIN 1.8* 1.6* 1.6* 1.6* 1.6*   No results for input(s): LIPASE, AMYLASE in the last 168 hours. No results for input(s): AMMONIA in the last 168 hours. CBC:  Recent Labs Lab 01/15/15 0500 01/17/15 0519 01/18/15 0512 01/21/15 0517  WBC 10.6* 9.9 12.3* 15.3*  HGB 8.2* 7.7* 7.9* 8.0*  HCT 23.8* 22.2* 22.6* 23.5*  MCV 83.5 83.8 84.0 84.5  PLT 345 340 338 379   Cardiac Enzymes: No results for input(s): CKTOTAL, CKMB, CKMBINDEX, TROPONINI in the last 168 hours. BNP (last 3 results) No results for input(s): BNP in the last 8760 hours.  ProBNP (last 3 results) No results for input(s): PROBNP in the last 8760 hours.  CBG: No results for input(s): GLUCAP in the last 168 hours.  Recent Results (from the past 240 hour(s))  Culture, body fluid-bottle     Status: None   Collection Time: 01/15/15  1:55 PM  Result Value Ref Range Status   Specimen Description BILE  Final   Special Requests BAA 10MLS  Final   Gram Stain   Final    GRAM NEGATIVE RODS IN BOTH AEROBIC AND ANAEROBIC BOTTLES BUDDING YEAST SEEN IN BOTH AEROBIC AND ANAEROBIC BOTTLES CRITICAL RESULT CALLED TO, READ BACK BY AND VERIFIED WITH: Pollie Meyer RN [id number] 01/15/15 A BROWNING    Culture ENTEROBACTER SPECIES CANDIDA  ALBICANS   Final   Report Status 01/18/2015 FINAL  Final   Organism ID, Bacteria ENTEROBACTER SPECIES  Final      Susceptibility   Enterobacter species - MIC*    CEFAZOLIN >=64 RESISTANT Resistant     CEFEPIME 2 SENSITIVE Sensitive     CEFTAZIDIME >=64 RESISTANT Resistant     CEFTRIAXONE >=64 RESISTANT Resistant     CIPROFLOXACIN <=0.25 SENSITIVE Sensitive     GENTAMICIN <=1 SENSITIVE Sensitive     IMIPENEM <=0.25 SENSITIVE Sensitive     TRIMETH/SULFA <=20 SENSITIVE Sensitive     PIP/TAZO >=128 RESISTANT Resistant     * ENTEROBACTER SPECIES  Gram stain     Status: None   Collection Time: 01/15/15  1:55 PM  Result Value Ref Range Status   Specimen Description BILE  Final   Special Requests NONE  Final   Gram Stain   Final    YEAST Gram Stain Report Called to,Read Back By and Verified With: A [facility] RN [id number] 01/15/15  A BROWNING    Report Status 01/15/2015 FINAL  Final     Studies: No results found.  Scheduled Meds: . ciprofloxacin  500 mg Oral BID  . [START ON 01/22/2015] enoxaparin (LOVENOX) injection  40 mg Subcutaneous Q24H  . fluconazole  200 mg Oral Q24H  . furosemide  20 mg Oral Daily  . metoprolol  25 mg Oral BID  . pantoprazole  40 mg Oral Daily  . piperacillin-tazobactam (ZOSYN)  IV  3.375 g Intravenous to XRAY  . sodium chloride  3 mL Intravenous Q12H  . sodium chloride  5 mL Intracatheter Q8H  . sodium chloride  1 g Oral BID WC   Continuous Infusions: . sodium chloride 10 mL/hr at 01/21/15 0700    Principal Problem:   Malignant obstructive jaundice Active Problems:   Dysphagia, oropharyngeal   Acid reflux   Essential (primary) hypertension   Hypercholesteremia   Cancer of tonsil (HCC)   Pancreatic mass   SIADH (syndrome of inappropriate ADH production) ([place])   Anemia   Hyperbilirubinemia   Euthyroid sick syndrome   AKI (acute kidney injury) ([place])   Biliary obstruction   Pancreatic cancer ([place])   Malignant neoplasm of pancreas ([place])    [facility],  [facility] Pager [phone]. If 7PM-7AM, please contact night-coverage at [email or web address], password [facility] 01/21/2015, 4:25 PM  LOS: 12 days

## 2015-01-21 NOTE — Progress Notes (Signed)
01/21/2015 6:01 PM  Patient and family informed that patient will not be going to IR today due to the IR scheduling conflicits. Will be going tomorrow per IR Nurse. Patient has resumed his Carb Modified diet and will be placed back NPO at midnight. Will continue to assess and monitor the patient.   [employer], RN-BC, [facility] Phone [phone]

## 2015-01-22 ENCOUNTER — Inpatient Hospital Stay (HOSPITAL_COMMUNITY): Payer: Medicare HMO

## 2015-01-22 ENCOUNTER — Inpatient Hospital Stay: Payer: Medicare HMO | Admitting: Oncology

## 2015-01-22 LAB — COMPREHENSIVE METABOLIC PANEL
ALT: 212 U/L — ABNORMAL HIGH (ref 17–63)
ANION GAP: 12 (ref 5–15)
AST: 316 U/L — AB (ref 15–41)
Albumin: 1.6 g/dL — ABNORMAL LOW (ref 3.5–5.0)
Alkaline Phosphatase: 279 U/L — ABNORMAL HIGH (ref 38–126)
BUN: 40 mg/dL — ABNORMAL HIGH (ref 6–20)
CHLORIDE: 95 mmol/L — AB (ref 101–111)
CO2: 22 mmol/L (ref 22–32)
Calcium: 8.6 mg/dL — ABNORMAL LOW (ref 8.9–10.3)
Creatinine, Ser: 1.71 mg/dL — ABNORMAL HIGH (ref 0.61–1.24)
GFR, EST AFRICAN AMERICAN: 42 mL/min — AB (ref >60)
GFR, EST NON AFRICAN AMERICAN: 36 mL/min — AB (ref >60)
Glucose, Bld: 187 mg/dL — ABNORMAL HIGH (ref 65–99)
POTASSIUM: 4.5 mmol/L (ref 3.5–5.1)
Sodium: 129 mmol/L — ABNORMAL LOW (ref 135–145)
TOTAL PROTEIN: 5.6 g/dL — AB (ref 6.5–8.1)
Total Bilirubin: 14.3 mg/dL — ABNORMAL HIGH (ref 0.3–1.2)

## 2015-01-22 MED ORDER — PIPERACILLIN-TAZOBACTAM 3.375 G IVPB
3.3750 g | INTRAVENOUS | Status: AC
Start: 2015-01-22 — End: 2015-01-22
  Administered 2015-01-22: 3.375 g via INTRAVENOUS
  Filled 2015-01-22 (×2): qty 50

## 2015-01-22 MED ORDER — ENOXAPARIN SODIUM 30 MG/0.3ML ~~LOC~~ SOLN
30.0000 mg | SUBCUTANEOUS | Status: DC
Start: 2015-01-23 — End: 2015-01-22

## 2015-01-22 MED ORDER — METOPROLOL TARTRATE 25 MG PO TABS: 25.0000 mg | ORAL_TABLET | Freq: Two times a day (BID) | ORAL | Status: AC

## 2015-01-22 MED ORDER — MIDAZOLAM HCL 2 MG/2ML IJ SOLN
INTRAMUSCULAR | Status: AC | PRN
  Administered 2015-01-22: 1 mg via INTRAVENOUS
  Administered 2015-01-22 (×2): 0.5 mg via INTRAVENOUS

## 2015-01-22 MED ORDER — FENTANYL CITRATE (PF) 100 MCG/2ML IJ SOLN
INTRAMUSCULAR | Status: AC
  Filled 2015-01-22: qty 4

## 2015-01-22 MED ORDER — MIDAZOLAM HCL 2 MG/2ML IJ SOLN
INTRAMUSCULAR | Status: AC
  Filled 2015-01-22: qty 4

## 2015-01-22 MED ORDER — SODIUM CHLORIDE 1 G PO TABS: 1.0000 g | ORAL_TABLET | Freq: Two times a day (BID) | ORAL | Status: AC

## 2015-01-22 MED ORDER — HEPARIN SOD (PORK) LOCK FLUSH 100 UNIT/ML IV SOLN
500.0000 [IU] | INTRAVENOUS | Status: AC | PRN
Start: 2015-01-22 — End: 2015-01-22
  Administered 2015-01-22: 500 [IU]

## 2015-01-22 MED ORDER — FLUCONAZOLE 200 MG PO TABS: 200.0000 mg | ORAL_TABLET | ORAL | Status: AC

## 2015-01-22 MED ORDER — HYDROCODONE-ACETAMINOPHEN 5-325 MG PO TABS: 1.0000 | ORAL_TABLET | ORAL | Status: AC | PRN

## 2015-01-22 MED ORDER — CIPROFLOXACIN HCL 500 MG PO TABS: 500.0000 mg | ORAL_TABLET | Freq: Every day | ORAL | Status: AC

## 2015-01-22 MED ORDER — IOHEXOL 300 MG/ML  SOLN
50.0000 mL | Freq: Once | INTRAMUSCULAR | Status: DC | PRN
  Administered 2015-01-22: 20 mL via INTRAVENOUS
  Filled 2015-01-22: qty 50

## 2015-01-22 MED ORDER — CIPROFLOXACIN HCL 500 MG PO TABS
500.0000 mg | ORAL_TABLET | Freq: Every day | ORAL | Status: DC
Start: 2015-01-23 — End: 2015-01-22

## 2015-01-22 MED ORDER — FUROSEMIDE 20 MG PO TABS: 20.0000 mg | ORAL_TABLET | Freq: Every day | ORAL | Status: AC

## 2015-01-22 MED ORDER — LIDOCAINE HCL 1 % IJ SOLN
INTRAMUSCULAR | Status: AC
  Filled 2015-01-22: qty 20

## 2015-01-22 MED ORDER — FENTANYL CITRATE (PF) 100 MCG/2ML IJ SOLN
INTRAMUSCULAR | Status: AC | PRN
  Administered 2015-01-22 (×4): 25 ug via INTRAVENOUS

## 2015-01-22 NOTE — Procedures (Signed)
Interventional Radiology Procedure Note  Procedure:  Biliary stenting and cholangiogram  Complications:  None  Estimated Blood Loss: < 10 mL  Common bile duct stricture treated with 10 mm x 40 mm Wallflex biliary stent.  Good flow via stent into duodenum. Percutaneous drain access removed.  Venetia Night. Kathlene Cote, M.D Pager:  [phone]

## 2015-01-22 NOTE — Progress Notes (Signed)
  Oncology Nurse Navigator Documentation      Patient Visit Type: Inpatient (01/22/15 1500)                    Time Spent with Patient: 15 (01/22/15 1500)   Pt scheduled for discharged likely in the next 48 hours. Appt rescheduled for hospital follow up with medical oncology (Dr [name]) for 01/26/15 at 3:45pm. Will notify Mr Mecham when he is discharged of appt.

## 2015-01-22 NOTE — Sedation Documentation (Signed)
Patient is resting comfortably. 

## 2015-01-22 NOTE — Progress Notes (Signed)
Pharmacy Antibiotic Time-Out Note  Geoffrey Gregory is a 81 y.o. year-old male admitted on 01/09/2015.  The patient is currently on Fluconazole and cipro for biliary infection.     Recent Labs Lab 01/17/15 0519 01/19/15 0458 01/20/15 0451 01/21/15 0517 01/22/15 1515  CREATININE 1.37* 1.44* 1.44* 1.45* 1.71*   Estimated Creatinine Clearance: 29.5 mL/min (by C-G formula based on Cr of 1.71).   Tmax/24h: Afebrile  Antimicrobial allergies: NKA  Antimicrobials this admission: Fluconazole 10/6 >> Cipro 10/7 >>  Levels/dose changes this admission: n/a  Microbiology Results: 10/6 Bile >> yeast + Enterobacter (sensitive to ciprofloxacin)   Assessment/Plan: 81 y.o male with Malignant obstructive jaundice. s/p external and internal biliary drain placed by IR on 10/1, exhanged on 10/5. The patient continues on Day #8 Fluconazole for yeast in the biliary drainage and Day #7 Ciprofloxacin for coverage of GNR identified as Enterobacter (sensitive to ciprofloxacin). Afebrile, WBC 9.9 >12.3 up to 15.3K. SCr trending down but today SCr has increased to 1.71 (1.72>1.57>1.37>1.44>1.44>1.71),  CrCl~29.5 ml/min    S/p internal metal stent 1012 per IR. No bleeding, no new cultures. Afebrile. Duration of ABX Rx ? 2 weeks. No stop date has been set at this time.    DVT prophylaxis on Lovenox 40mg  SQ q24h , held on 10/11 & 10/12 prior to internal metal stent placement on 10/12. Lovenox resumed today 01/22/15.  SCr has increased to 1.71, estimated CrCl ~ 29 ml/min.  Weight = 59.5 kg, Hgb 7.7>7.9>8.0, pts wnl, no bleeding noted. I will decrease Lovenox DVT prophylaxis dose to 30 mg SQ q24h (for CrCl <30 ml/min)     Plan 1. Continue Fluconazole 200 mg po every 24 hours 2. Decrease Cipro 500 mg po to q24h 3. Will continue to follow renal function, culture results, LOT, and antibiotic de-escalation plans  4. Decrease Lovenox DVT prophylaxis dose to 30 mg SQ q24h (for CrCl <30 ml/min)   Thank you for  allowing pharmacy to be a part of this patient's care.  Nicole Cella, RPh Clinical Pharmacist Pager: [phone] 01/22/2015 4:06 PM

## 2015-01-22 NOTE — Sedation Documentation (Addendum)
Patient is resting comfortably. 

## 2015-01-22 NOTE — Sedation Documentation (Addendum)
Patient is resting comfortably.  Denies pain.

## 2015-01-22 NOTE — Sedation Documentation (Signed)
Patient is resting comfortably.  Denies pain.

## 2015-01-22 NOTE — Progress Notes (Signed)
Evern Bio to be D/C'd Home per MD order.  Discussed prescriptions and follow up appointments with the patient. Prescriptions given to patient, medication list explained in detail. Pt verbalized understanding.    Medication List    STOP taking these medications        acetaminophen 500 MG tablet  Commonly known as:  TYLENOL     lovastatin 40 MG tablet  Commonly known as:  MEVACOR      TAKE these medications        aspirin 81 MG tablet  Take by mouth daily.     cholecalciferol 1000 UNITS tablet  Commonly known as:  VITAMIN D  Take 1,000 Units by mouth daily.     ciprofloxacin 500 MG tablet  Commonly known as:  CIPRO  Take 1 tablet (500 mg total) by mouth daily with breakfast.  Start taking on:  01/23/2015     fluconazole 200 MG tablet  Commonly known as:  DIFLUCAN  Take 1 tablet (200 mg total) by mouth daily.     furosemide 20 MG tablet  Commonly known as:  LASIX  Take 1 tablet (20 mg total) by mouth daily.     HYDROcodone-acetaminophen 5-325 MG tablet  Commonly known as:  NORCO/VICODIN  Take 1-2 tablets by mouth every 4 (four) hours as needed for moderate pain.     metoprolol tartrate 25 MG tablet  Commonly known as:  LOPRESSOR  Take 1 tablet (25 mg total) by mouth 2 (two) times daily.     omeprazole 40 MG capsule  Commonly known as:  PRILOSEC  Take 40 mg by mouth daily.     sodium chloride 1 G tablet  Take 1 tablet (1 g total) by mouth 2 (two) times daily with a meal.        Filed Vitals:   01/22/15 1618  BP: 115/63  Pulse: 82  Temp: 97.5 F (36.4 C)  Resp: 18    Skin clean, dry and intact without evidence of skin break down, no evidence of skin tears noted. Port-a-cath de accessed by IV team per MD order. Pt denies pain at this time. No complaints noted.  An After Visit Summary was printed and given to the patient. Patient escorted via [place], and D/C home via private auto.  Marijean Heath C 01/22/2015 7:26 PM

## 2015-01-22 NOTE — Sedation Documentation (Signed)
Stent is now here and procedure resuming.

## 2015-01-22 NOTE — Care Management Important Message (Signed)
Important Message  Patient Details  Name: Geoffrey Gregory MRN: [id number] Date of Birth: 1933/03/15   Medicare Important Message Given:  Yes-second notification given    Nathen May 01/22/2015, 11:04 AM

## 2015-01-22 NOTE — Sedation Documentation (Addendum)
Patient is resting fairly comfortably. Having some discomfort, procedure almost finished as per Dr Kathlene Cote.

## 2015-01-22 NOTE — Sedation Documentation (Signed)
Need a different size stent from [facility], so will continue to monitor patient during delay in procedure.

## 2015-01-22 NOTE — Discharge Summary (Signed)
Physician Discharge Summary  Geoffrey Gregory:[id number] DOB: Aug 19, 1932 DOA: 01/09/2015  PCP: [name], MD  Admit date: 01/09/2015 Discharge date: 01/22/2015  Time spent: 20 minutes  Recommendations for Outpatient Follow-up:  1. Follow up with PCP in 1-2 weeks 2. Would repeat comprehensive metabolic panel in 1 week, focus on LFT's and sodium  Discharge Diagnoses:  Principal Problem:   Malignant obstructive jaundice Active Problems:   Dysphagia, oropharyngeal   Acid reflux   Essential (primary) hypertension   Hypercholesteremia   Cancer of tonsil (HCC)   Pancreatic mass   SIADH (syndrome of inappropriate ADH production) (HCC)   Anemia   Hyperbilirubinemia   Euthyroid sick syndrome   AKI (acute kidney injury) ([place])   Biliary obstruction   Pancreatic cancer ([place])   Malignant neoplasm of pancreas [facility])   Discharge Condition: Stable  Diet recommendation: Regular  Filed Weights   01/18/15 2229 01/19/15 2111 01/21/15 2050  Weight: 60.782 kg (134 lb) 60.694 kg (133 lb 12.9 oz) 59.53 kg (131 lb 3.8 oz)    History of present illness:  Please see admit h and p from 9/30 for details. Geoffrey Gregory,Geoffrey Gregory bm transferred from [facility] for biliary drain placement by IR and further workup of pancreatic mass. Was admitted there with sodium of 111 felt to be due to SIADH, jaundice and pancreatic mass. 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 Transferring physician reportedly spoke with interventional radiology and gastroenterology, although it's unclear which gastroenterologist was contacted. Patient was transferred for further work up.  Hospital Course:  Principal Problem:  Malignant obstructive jaundice:  - Status post external and internal biliary drain placed by IR.  - Bilirubin decreased initially then started climbing again.  - Dr. [name] had  discussed case with IR & with [name], patient's Oncologist in [place] -His navigator will arrange f/u after patient discharged - Cholangiogram 10/5 through existing tube demonstrated partial occlusion of the train and the drain was successfully exchanged for a new 10 French catheter - After biliary drain was upsized, total bilirubin transiently dropped from 22 >19 but has since plateaued. Case was discussed with Oncology (Dr. Metro Kung patner) on 10/7 recommended inpatient oncology consultation. Discussed with Dr. Alvy Bimler who recommended CT neck, chest, abdomen and pelvis without contrast to assess for spread. - Possible biliary infection with yeast and Enterobacter, therefore started Cipro 10/7 and fluconazole 10/6 : duration of Rx ? 2 weeks with anticipated stop date on 10/20 - Oncology later recommended GI consultation for possible ERCP. GI follow-up 10/10 appreciated with recommendation for IR to place internal metal stent since it should have the same success as ERCP for internal stenting - Case was further discussed with IR on 10/10 and patient ultimately underwent biliary stent placement on 10/13. Per IR, pt is clear for d/c today  Active Problems:  Pancreatic mass:  - Per above   SIADH (syndrome of inappropriate ADH production):  - Sodium had gradually improved after fluid restriction and Lasix. Lasix was temporarily stopped for 2 days due to worsening creatinine. Sodium has again started to drop. Discussed with Dr. Jimmy Footman, nephrology on 10/7: Recommends resuming Lasix 20 mg orally daily and we'll have to accept increased creatinine in the 1.5-2 range. Prognosis overall poor. - Lasix was resumed. Remained near 130 (was as low as 116 on day of transfer) - Given elevated LFT's would avoid tolvaptan  - Started trial of salt  tabs - Would recommend following Comprehensive Panel in one week   Acid reflux:  - On PPI   Essential (primary) hypertension:  - Continued  metoprolol. Would not resume thiazide. controlled   Hypercholesteremia   Cancer of tonsil:  - Status post chemoradiation, in remission per oncology note.    Anemia:  - No gross bleeding. anemia panel with minimally low iron at 41, but normal ferritin. Hb gradually dropping. Consider transfusion if hemoglobin less than 7 g per DL. Hemoglobin 8   Euthyroid sick syndrome:  - Would repeat thyroid function tests in 4-6 weeks  Hypokalemia:  - Repleted by mouth.  AKI:  - See above. Lasix temporarily held due to worsening renal function. Creatinine has improved slightly. Lasix was resumed. Creatinine stable in the 1.3-1.4 range  Type II DM - Reasonably controlled. Not on meds currently  Procedures:  upsizing of biliary drain by IR on 10/5  Biliary stenting and cholangiogram 10/13  Consultations:  Nephrology  Interventional radiology  Medical oncology.  [facility]-signed off 10/10  Discharge Exam: Filed Vitals:   01/22/15 1125 01/22/15 1130 01/22/15 1137 01/22/15 1618  BP: 158/82 136/80  115/63  Pulse: 91 89  82  Temp:    97.5 F (36.4 C)  TempSrc:    Oral  Resp: 18 19  18   Height:      Weight:      SpO2: 100% 100% 100% 100%    General: Awake, eating, in nad Cardiovascular: regular, s1, s2 Respiratory: normal resp effort, no wheezing  Discharge Instructions     Medication List    STOP taking these medications        acetaminophen 500 MG tablet  Commonly known as:  TYLENOL     lovastatin 40 MG tablet  Commonly known as:  MEVACOR      TAKE these medications        aspirin 81 MG tablet  Take by mouth daily.     cholecalciferol 1000 UNITS tablet  Commonly known as:  VITAMIN D  Take 1,000 Units by mouth daily.     ciprofloxacin 500 MG tablet  Commonly known as:  CIPRO  Take 1 tablet (500 mg total) by mouth daily with breakfast.  Start taking on:  01/23/2015     fluconazole 200 MG tablet  Commonly known as:  DIFLUCAN  Take 1 tablet (200  mg total) by mouth daily.     furosemide 20 MG tablet  Commonly known as:  LASIX  Take 1 tablet (20 mg total) by mouth daily.     HYDROcodone-acetaminophen 5-325 MG tablet  Commonly known as:  NORCO/VICODIN  Take 1-2 tablets by mouth every 4 (four) hours as needed for moderate pain.     metoprolol tartrate 25 MG tablet  Commonly known as:  LOPRESSOR  Take 1 tablet (25 mg total) by mouth 2 (two) times daily.     omeprazole 40 MG capsule  Commonly known as:  PRILOSEC  Take 40 mg by mouth daily.     sodium chloride 1 G tablet  Take 1 tablet (1 g total) by mouth 2 (two) times daily with a meal.       No Known Allergies Follow-up Information    Follow up with [name], MD. Schedule an appointment as soon as possible for a visit in 1 week.   Specialty:  Family Medicine   Why:  Hospital follow up   Contact information:   [address] [place] [address]  [phone]       Follow up with Forest Gleason, MD.   Specialty:  Oncology   Why:  you will be contacted to schedule an appointment   Contact information:   [address] [place] [address] [phone]        The results of significant diagnostics from this hospitalization (including imaging, microbiology, ancillary and laboratory) are listed below for reference.    Significant Diagnostic Studies: Ct Abdomen Pelvis Wo Contrast  01/16/2015  CLINICAL DATA:  Cancer ([place]) C80.1 (ICD-10-CM). Patient originally presented with painless jaundice, weakness and mellitus. Patient had a pancreatic MRI, not currently available for review, demonstrating a pancreatic mass obstructing the common bile duct. Patient with elevated bilirubin as high as 19. Also was a history of left tonsillar squamous cell carcinoma. EXAM: CT CHEST, ABDOMEN AND PELVIS WITHOUT CONTRAST TECHNIQUE: Multidetector CT imaging of the chest, abdomen and pelvis was performed following the standard protocol without IV  contrast. COMPARISON:  PET-CT, 11/11/2013.  Chest CT, 05/06/2014. FINDINGS: CT CHEST FINDINGS Thoracic inlet and axilla. There is left axillary adenopathy, largest node measuring 12 mm in short axis. There is intervening inflammatory type stranding. There is increased density in the retroareolar region of the left breast, most likely gynecomastia. Axillary adenopathy was present previously. Apparent gynecomastia is new. No neck base masses or adenopathy. Mediastinum and hila: Focal bulge from the aortic arch is stable from the prior exam. Heart is normal in size. There are coronary artery calcifications, also stable. Mildly enlarged precarinal lymph node measuring 13 mm, previously subcentimeter. No hilar masses or adenopathy. Lungs and pleura: 5 mm left lower lobe pulmonary nodule, image 45, series 4, with associated linear opacity. Nodule is new or increased in size. Linear opacity, likely scarring, stable. No other discrete nodules. Small right pleural effusion and minimal left pleural effusion. Mild lung base linear/reticular opacity is noted consistent subsegmental atelectasis. No lung consolidation or edema. There are moderate changes of centrilobular emphysema. CT ABDOMEN AND PELVIS FINDINGS Percutaneous biliary stent extends from the upper anterior abdominal wall to the region of the porta hepatis into the common bile duct passing through the ampulla of Vater into the third portion of the duodenum. There is some intrahepatic biliary air. There is prominence of the pancreatic head. Is no defined mass. Abnormal hypo attenuating soft tissue or fluid extends along the gastrohepatic ligament. Mild inflammatory type stranding lies adjacent to the distal stomach and pancreatic head as well as the duodenum and along the anterior para renal fascia. A trace amount of ascites is seen along the anterior perirenal fascia, adjacent to the liver and along the right pericolic gutter extending into the posterior pelvic  recess. Liver: No mass or focal lesion. Low density consistent with edema extends along the portal branches. Spleen:  Unremarkable. Gallbladder: Dense material lies within the gallbladder. No evidence of acute cholecystitis. Adrenal glands:  No masses. Kidneys, ureters, bladder: 2.9 cm low-density mass projects medially from the mid to lower pole left kidney, likely a cyst. No other renal masses. No collecting system stones. No hydronephrosis. Ureters normal in course and in caliber. Bladder is unremarkable. Prostate gland:  Enlarged measuring 5.4 x 4.3 cm transversely. Lymph nodes: Multiple prominent lymph nodes. These are most evident in the upper abdomen and in the peritoneal cavity. There is a right lower quadrant mesenteric node measuring 10 mm in short axis. Focal soft tissue nodules anterior to the distal stomach below the left lobe of the liver are noted which  may reflect additional lymph nodes, largest measuring 11 mm in short axis. There is more a ill-defined soft tissue nodularity in the teres celiac region extending toward the porta hepatis and pancreatic head likely also adenopathy. Vascular: Patient has an aortic stent graft excluding and infrarenal abdominal aortic aneurysm. Native aneurysm measures 4.9 x 5.6 cm. No evidence of rupture. Gastrointestinal: Mild prominence of the colon and loops small bowel several with air-fluid levels. A mild adynamic ileus is suspected. There is no bowel wall thickening. There is no evidence of obstruction. Uninflamed diverticula are noted along the left colon. Musculoskeletal:  No osteoblastic or osteolytic lesions. IMPRESSION: 1. Pancreatic head mass is not well-defined on the unenhanced images. There is pancreatic head fullness. Abnormal low attenuation soft tissue extends from the pancreatic head along the porta hepatis, which may reflect edema. Some of this may be infiltrating tumor. There is shotty adenopathy in the upper abdomen along the periceliac chain,  gastrohepatic ligament and in the peripancreatic region. There is additional adenopathy in the peritoneal cavity most prominent in the right mid to lower quadrant. There is also abnormal soft tissue nodules in the anterior upper abdomen just below the left lobe of the liver, which may reflect additional adenopathy or peritoneal/omental carcinoma deposits. Peritoneal spread of carcinoma is suspected. Small amount of ascites. 2. Well-positioned biliary stent, percutaneously placed. No significant intrahepatic bile duct dilation. 3. Mild prominence of the colon small bowel. Mild adynamic ileus is suspected. No bowel obstruction or bowel inflammation. 4. Small right minimal left pleural effusions. 5. 5 mm left lower lobe pulmonary nodule. This could reflect neoplastic disease or be due to scarring. The nodule appears new or increased compared the prior CT. 6. Left axillary adenopathy similar to the prior CT. 7. Increased soft tissue in the retroareolar region of the left breast. This most likely gynecomastia, but is new. Consider follow-up with mammography if this patient has a firm palpable lump in this region. 8. Chronic findings include emphysema, changes from aortic aneurysm surgery and prostatic enlargement. Electronically Signed   By: Lajean Manes M.D.   On: 01/16/2015 15:19   Ct Soft Tissue Neck Wo Contrast  01/16/2015  CLINICAL DATA:  Personal history of pancreatic and tonsillar cancer. Tonsillar cancer was on the left. Interval follow-up. EXAM: CT NECK WITHOUT CONTRAST TECHNIQUE: Multidetector CT imaging of the neck was performed following the standard protocol without intravenous contrast. COMPARISON:  05/06/2014 CT.  PET scan 11/11/2013 FINDINGS: No evidence of residual or recurrent tonsillar mass. Mild chronic scarring in the tonsillar region on the left as seen previously. The glottis is largely closed. This could be due to voluntary glottic closure. Glottic/supraglottic mass is not excluded given this  appearance however. Along the right side of the trachea in the upper thoracic region, there is abnormal material which could be mucous most likely. Tracheal mass lesion is not excluded but not favored. Surgical clips are present in the left neck related to previous node dissection or vascular surgery. No enlarged or low-density nodes are demonstrated. Lung apices are clear. There is atherosclerosis of the aorta with a small focal aneurysm of the arch, not completely evaluated today but not visibly change since the previous study. Submandibular glands and parotid glands are normal. Ordinary cervical spondylosis. IMPRESSION: Prominent tissue at the glottis/supraglottic region. This could be voluntary glottic closure. The possibility of a glottic mass does exist. Direct inspection would be advisable. No evidence of recurrent disease in the left tonsillar region. No enlarged lymph nodes. Abnormal material  along the right side of the trachea in the upper thoracic region, most likely representing mucus. The possibility of a mass does exist but is less likely. Electronically Signed   By: Nelson Chimes M.D.   On: 01/16/2015 15:39   Ct Chest Wo Contrast  01/16/2015  CLINICAL DATA:  Cancer ([place]) C80.1 (ICD-10-CM). Patient originally presented with painless jaundice, weakness and mellitus. Patient had a pancreatic MRI, not currently available for review, demonstrating a pancreatic mass obstructing the common bile duct. Patient with elevated bilirubin as high as 19. Also was a history of left tonsillar squamous cell carcinoma. EXAM: CT CHEST, ABDOMEN AND PELVIS WITHOUT CONTRAST TECHNIQUE: Multidetector CT imaging of the chest, abdomen and pelvis was performed following the standard protocol without IV contrast. COMPARISON:  PET-CT, 11/11/2013.  Chest CT, 05/06/2014. FINDINGS: CT CHEST FINDINGS Thoracic inlet and axilla. There is left axillary adenopathy, largest node measuring 12 mm in short axis. There is intervening  inflammatory type stranding. There is increased density in the retroareolar region of the left breast, most likely gynecomastia. Axillary adenopathy was present previously. Apparent gynecomastia is new. No neck base masses or adenopathy. Mediastinum and hila: Focal bulge from the aortic arch is stable from the prior exam. Heart is normal in size. There are coronary artery calcifications, also stable. Mildly enlarged precarinal lymph node measuring 13 mm, previously subcentimeter. No hilar masses or adenopathy. Lungs and pleura: 5 mm left lower lobe pulmonary nodule, image 45, series 4, with associated linear opacity. Nodule is new or increased in size. Linear opacity, likely scarring, stable. No other discrete nodules. Small right pleural effusion and minimal left pleural effusion. Mild lung base linear/reticular opacity is noted consistent subsegmental atelectasis. No lung consolidation or edema. There are moderate changes of centrilobular emphysema. CT ABDOMEN AND PELVIS FINDINGS Percutaneous biliary stent extends from the upper anterior abdominal wall to the region of the porta hepatis into the common bile duct passing through the ampulla of Vater into the third portion of the duodenum. There is some intrahepatic biliary air. There is prominence of the pancreatic head. Is no defined mass. Abnormal hypo attenuating soft tissue or fluid extends along the gastrohepatic ligament. Mild inflammatory type stranding lies adjacent to the distal stomach and pancreatic head as well as the duodenum and along the anterior para renal fascia. A trace amount of ascites is seen along the anterior perirenal fascia, adjacent to the liver and along the right pericolic gutter extending into the posterior pelvic recess. Liver: No mass or focal lesion. Low density consistent with edema extends along the portal branches. Spleen:  Unremarkable. Gallbladder: Dense material lies within the gallbladder. No evidence of acute cholecystitis.  Adrenal glands:  No masses. Kidneys, ureters, bladder: 2.9 cm low-density mass projects medially from the mid to lower pole left kidney, likely a cyst. No other renal masses. No collecting system stones. No hydronephrosis. Ureters normal in course and in caliber. Bladder is unremarkable. Prostate gland:  Enlarged measuring 5.4 x 4.3 cm transversely. Lymph nodes: Multiple prominent lymph nodes. These are most evident in the upper abdomen and in the peritoneal cavity. There is a right lower quadrant mesenteric node measuring 10 mm in short axis. Focal soft tissue nodules anterior to the distal stomach below the left lobe of the liver are noted which may reflect additional lymph nodes, largest measuring 11 mm in short axis. There is more a ill-defined soft tissue nodularity in the teres celiac region extending toward the porta hepatis and pancreatic head likely also adenopathy.  Vascular: Patient has an aortic stent graft excluding and infrarenal abdominal aortic aneurysm. Native aneurysm measures 4.9 x 5.6 cm. No evidence of rupture. Gastrointestinal: Mild prominence of the colon and loops small bowel several with air-fluid levels. A mild adynamic ileus is suspected. There is no bowel wall thickening. There is no evidence of obstruction. Uninflamed diverticula are noted along the left colon. Musculoskeletal:  No osteoblastic or osteolytic lesions. IMPRESSION: 1. Pancreatic head mass is not well-defined on the unenhanced images. There is pancreatic head fullness. Abnormal low attenuation soft tissue extends from the pancreatic head along the porta hepatis, which may reflect edema. Some of this may be infiltrating tumor. There is shotty adenopathy in the upper abdomen along the periceliac chain, gastrohepatic ligament and in the peripancreatic region. There is additional adenopathy in the peritoneal cavity most prominent in the right mid to lower quadrant. There is also abnormal soft tissue nodules in the anterior upper  abdomen just below the left lobe of the liver, which may reflect additional adenopathy or peritoneal/omental carcinoma deposits. Peritoneal spread of carcinoma is suspected. Small amount of ascites. 2. Well-positioned biliary stent, percutaneously placed. No significant intrahepatic bile duct dilation. 3. Mild prominence of the colon small bowel. Mild adynamic ileus is suspected. No bowel obstruction or bowel inflammation. 4. Small right minimal left pleural effusions. 5. 5 mm left lower lobe pulmonary nodule. This could reflect neoplastic disease or be due to scarring. The nodule appears new or increased compared the prior CT. 6. Left axillary adenopathy similar to the prior CT. 7. Increased soft tissue in the retroareolar region of the left breast. This most likely gynecomastia, but is new. Consider follow-up with mammography if this patient has a firm palpable lump in this region. 8. Chronic findings include emphysema, changes from aortic aneurysm surgery and prostatic enlargement. Electronically Signed   By: Lajean Manes M.D.   On: 01/16/2015 15:19   Mr Abd W/wo Cm/mrcp  01/07/2015  CLINICAL DATA:  Biliary obstruction.  Jaundice EXAM: MRI ABDOMEN WITHOUT AND WITH CONTRAST (INCLUDING MRCP) TECHNIQUE: Multiplanar multisequence MR imaging of the abdomen was performed both before and after the administration of intravenous contrast. Heavily T2-weighted images of the biliary and pancreatic ducts were obtained, and three-dimensional MRCP images were rendered by post processing. CONTRAST:  37mL MULTIHANCE GADOBENATE DIMEGLUMINE 529 MG/ML IV SOLN COMPARISON:  05/06/2014 FINDINGS: Lower chest: Small bilateral pleural effusions are identified. New from previous study. Hepatobiliary: No focal liver lesions identified. Marked intrahepatic bile duct dilatation. The extrahepatic common bile duct measured 1 cm. Abrupt cut off of the common bile duct is identified. Pancreas: There is an ill defined mass arising from the  head of pancreas which measures approximately 4.1 x 3.6 x 4.6 cm. The mass completely encases the portal venous confluence with significant diminished caliber of the portal vein. Branches of the celiac trunk appear encased by this mass. The SMA appears patent an unaffected. Spleen: Normal appearance of the spleen. Adrenals/Urinary Tract: The adrenal glands are unremarkable. Bilateral renal cysts noted. Stomach/Bowel: The stomach and the upper abdominal bowel loops are unremarkable. Vascular/Lymphatic: Status post repair of infrarenal abdominal aortic aneurysm. The aneurysm sac measures 4.6 cm, image 7 of series 13. Previously 5.5 cm. No retroperitoneal adenopathy identified. Other: There is a small amount of ascites identified within the upper abdomen. Musculoskeletal: No specific findings to suggest bone metastasis. IMPRESSION: 1. Large mass arising from the head of pancreas is identified worrisome for primary pancreatic neoplasm. 2. There is complete obstruction of the common  bile duct. The portal venous confluence is completely encased by the lesion. There is also involvement of a branches of the celiac trunk. 3. No specific findings identified to suggest liver metastasis. Electronically Signed   By: Kerby Moors M.D.   On: 01/07/2015 17:53   Ir Int Lianne Cure Biliary Drain With Cholangiogram  01/10/2015  INDICATION: Obstructing pancreatic mass. Intra and extrahepatic biliary ductal dilatation. Remote history of tonsillar carcinoma with resultant stricturing, complicating attempts at endoscopy and intubation. EXAM: ULTRASOUND AND FLUOROSCOIC GUIDED PERCUTANEOUS TRANSHEPATIC CHOLANGIOGRAM INTERNAL/EXTERNAL BILIARY DRAIN TUBE PLACEMENT BILIARY BRUSH BIOPSY COMPARISON:  MR 01/07/2015 AND EARLIER STUDIES MEDICATIONS: cefazolin 2g preprocedural CONTRAST:  32mL OMNIPAQUE IOHEXOL 300 MG/ML  SOLN ANESTHESIA/SEDATION: Intravenous Fentanyl and Versed were administered as conscious sedation during continuous cardiorespiratory  monitoring by the radiology RN, with a total moderate sedation time of 32 minutes. FLUOROSCOPY TIME:  8 minutes 24 seconds, 419   mGy. COMPLICATIONS: None immediate TECHNIQUE: Informed written consent was obtained from [employer] after a discussion of the risks, benefits and alternatives to treatment. Questions regarding the procedure were encouraged and answered. A timeout was performed prior to the initiation of the procedure. The right upper abdominal quadrant was prepped and draped in the usual sterile fashion, and a sterile drape was applied covering the operative field. Maximum barrier sterile technique with sterile gowns and gloves were used for the procedure. A timeout was performed prior to the initiation of the procedure. Ultrasound scanning of the right upper abdominal quadrant was performed to delineate the anatomy and avoid transgression of the gallbladder or the pleural. An entry site in the right epigastric region was identified . After the overlying soft tissues were anesthetized with 1% Lidocaine , a 21 gauge micropuncture needle was utilized to access the peripheral aspect of an anterior left hepatic duct. A 018 guidewire advanced centrally easily. A 3 French micro dilator was placed over the guidewire and contrast injection confirmed appropriate positioning in the biliary tree. The micro dilator was exchanged for a transitional dilator, exchanged for a Kumpe catheter over a Benson wire. With the use of a regular glide wire, the Kumpe catheter was advanced central to an apparent CBD structure, and ultimately into the distal duodenum. Over an Amplatz stiff wire, the tract was dilated and a 9 [place] long peel-away sheath was advanced. The dilator was removed and biliary brush biopsy of the CBD obstructive region performed x2, submitted to cytology. The peel-away was then exchanged for a 10.2 [place] biliary drainage catheter, advanced with coil ultimately locked within the duodenum. Contrast was  injected and a completion radiograph was obtained. The catheter was connected to a drainage bag which yielded the brisk return of clear bile. The catheter was secured to the skin with an 0 Prolene suture and StatLock. The patient tolerated the procedure well without immediate postprocedural complication. FINDINGS: The initial percutaneous transhepatic cholangiogram demonstrates marked intrahepatic biliary ductal dilatation. The proximal common duct just beyond the biliary confluence is patent. There is a long irregular stricture of the mid and distal CBD, with associated mucosal irregularity. The duodenum is decompressed. Percutaneous biliary brush biopsy of the CBD lesion was performed x2. 10 French internal external biliary drain catheter was placed to the duodenum without complication. IMPRESSION: 1. Percutaneous transhepatic cholangiogram demonstrating marked intrahepatic biliary ductal dilatation, long segment CBD irregularity and narrowing. 2. Technically successful brush biopsy of CBD lesion. 3. Technically successful internal external biliary drain catheter placement. The catheter was left to external drainage to maximize biliary decompression.  Electronically Signed   By: Lucrezia Europe M.D.   On: 01/10/2015 13:00   Ir Exchange Biliary Drain  01/14/2015  CLINICAL DATA:  81 year old with pancreatic cancer and biliary obstruction. The patient has an internal/external biliary drain. Despite drain placement, the bilirubin has been increasing. Cholangiogram scheduled for evaluation of the tube and biliary system. EXAM: CHOLANGIOGRAM THROUGH EXISTING CATHETER ; EXCHANGE OF PERCUTANEOUS BILIARY CATHETER WITH FLUOROSCOPY Physician: Stephan Minister. Henn, MD FLUOROSCOPY TIME:  1 minutes and 24 seconds, 36.6 mGy MEDICATIONS: None ANESTHESIA/SEDATION: Moderate sedation time: None PROCEDURE: The procedure was explained to the patient. The risks and benefits of the procedure were discussed and the patient's questions were  addressed. Informed consent was obtained from the patient. The patient was placed supine on the interventional table. Contrast was injected through the existing catheter. The catheter and surrounding skin were prepped and draped in sterile fashion. Maximal barrier sterile technique was utilized including caps, mask, sterile gowns, sterile gloves, sterile drape, hand hygiene and skin antiseptic. The retention suture was removed. The catheter was cut and removed over a Bentson wire. A new 10 [place] biliary drain was advanced over the wire and the tip was positioned in the duodenum. Small amount of contrast was injected to confirm placement. Catheter was flushed with saline. A dressing was placed over the catheter site. FINDINGS: The left internal/external biliary drain was stable in position from the prior examination. The intrahepatic ducts are decompressed and appears to be draining both the left and right ducts. The distal aspect of the biliary drain was patent but there was sluggish flow and appeared to be partial occlusion. New catheter was positioned in the biliary system with the tip in the duodenum. New catheter is widely patent following contrast injection. Estimated blood loss: None CONTRAST:  10 mL Omnipaque 371 COMPLICATIONS: None IMPRESSION: The biliary drain was patent but there was sluggish flow through the distal component. Therefore, the biliary drain was exchanged. Continue to follow liver enzymes. Electronically Signed   By: Markus Daft M.D.   On: 01/14/2015 10:34   Ir Endoluminal Bx Of Biliary Tree  01/10/2015  INDICATION: Obstructing pancreatic mass. Intra and extrahepatic biliary ductal dilatation. Remote history of tonsillar carcinoma with resultant stricturing, complicating attempts at endoscopy and intubation. EXAM: ULTRASOUND AND FLUOROSCOIC GUIDED PERCUTANEOUS TRANSHEPATIC CHOLANGIOGRAM INTERNAL/EXTERNAL BILIARY DRAIN TUBE PLACEMENT BILIARY BRUSH BIOPSY COMPARISON:  MR 01/07/2015 AND  EARLIER STUDIES MEDICATIONS: cefazolin 2g preprocedural CONTRAST:  56mL OMNIPAQUE IOHEXOL 300 MG/ML  SOLN ANESTHESIA/SEDATION: Intravenous Fentanyl and Versed were administered as conscious sedation during continuous cardiorespiratory monitoring by the radiology RN, with a total moderate sedation time of 32 minutes. FLUOROSCOPY TIME:  8 minutes 24 seconds, 419   mGy. COMPLICATIONS: None immediate TECHNIQUE: Informed written consent was obtained from [facility] [employer] after a discussion of the risks, benefits and alternatives to treatment. Questions regarding the procedure were encouraged and answered. A timeout was performed prior to the initiation of the procedure. The right upper abdominal quadrant was prepped and draped in the usual sterile fashion, and a sterile drape was applied covering the operative field. Maximum barrier sterile technique with sterile gowns and gloves were used for the procedure. A timeout was performed prior to the initiation of the procedure. Ultrasound scanning of the right upper abdominal quadrant was performed to delineate the anatomy and avoid transgression of the gallbladder or the pleural. An entry site in the right epigastric region was identified . After the overlying soft tissues were anesthetized with 1% Lidocaine , a  21 gauge micropuncture needle was utilized to access the peripheral aspect of an anterior left hepatic duct. A 018 guidewire advanced centrally easily. A 3 French micro dilator was placed over the guidewire and contrast injection confirmed appropriate positioning in the biliary tree. The micro dilator was exchanged for a transitional dilator, exchanged for a Kumpe catheter over a Benson wire. With the use of a regular glide wire, the Kumpe catheter was advanced central to an apparent CBD structure, and ultimately into the distal duodenum. Over an Amplatz stiff wire, the tract was dilated and a 9 [place] long peel-away sheath was advanced. The dilator was removed and  biliary brush biopsy of the CBD obstructive region performed x2, submitted to cytology. The peel-away was then exchanged for a 10.2 [place] biliary drainage catheter, advanced with coil ultimately locked within the duodenum. Contrast was injected and a completion radiograph was obtained. The catheter was connected to a drainage bag which yielded the brisk return of clear bile. The catheter was secured to the skin with an 0 Prolene suture and StatLock. The patient tolerated the procedure well without immediate postprocedural complication. FINDINGS: The initial percutaneous transhepatic cholangiogram demonstrates marked intrahepatic biliary ductal dilatation. The proximal common duct just beyond the biliary confluence is patent. There is a long irregular stricture of the mid and distal CBD, with associated mucosal irregularity. The duodenum is decompressed. Percutaneous biliary brush biopsy of the CBD lesion was performed x2. 10 French internal external biliary drain catheter was placed to the duodenum without complication. IMPRESSION: 1. Percutaneous transhepatic cholangiogram demonstrating marked intrahepatic biliary ductal dilatation, long segment CBD irregularity and narrowing. 2. Technically successful brush biopsy of CBD lesion. 3. Technically successful internal external biliary drain catheter placement. The catheter was left to external drainage to maximize biliary decompression. Electronically Signed   By: Lucrezia Europe M.D.   On: 01/10/2015 13:00   US Abdomen Limited Ruq  01/07/2015  CLINICAL DATA:  Elevated liver function studies, history of diabetes, alcohol dependence in remission, coronary artery disease. EXAM: US ABDOMEN LIMITED - RIGHT UPPER QUADRANT COMPARISON:  Abdominal pelvic CT scan of December 20, 2013 FINDINGS: Gallbladder: The gallbladder is adequately distended. There is echogenic bile versus tiny floating stones within the gallbladder lumen. There is no gallbladder wall thickening,  pericholecystic fluid, or positive sonographic Murphy's sign. Common bile duct: Diameter: 10 mm Liver: The intrahepatic ducts are dilated. No discrete hepatic mass is observed. There is a hypoechoic mass associated with the inferior aspect of the pancreatic head. There is ascites. IMPRESSION: Suspicious masslike lesion in the region of the pancreatic head resulting in biliary ductal dilation. Possible tiny gallstones versus echogenic bile. Abdominal MRI or CT scanning is recommended. Electronically Signed   By: David  Martinique M.D.   On: 01/07/2015 14:03    Microbiology: Recent Results (from the past 240 hour(s))  Culture, body fluid-bottle     Status: None   Collection Time: 01/15/15  1:55 PM  Result Value Ref Range Status   Specimen Description BILE  Final   Special Requests BAA 10MLS  Final   Gram Stain   Final    GRAM NEGATIVE RODS IN BOTH AEROBIC AND ANAEROBIC BOTTLES BUDDING YEAST SEEN IN BOTH AEROBIC AND ANAEROBIC BOTTLES CRITICAL RESULT CALLED TO, READ BACK BY AND VERIFIED WITH: Pollie Meyer RN [id number] 01/15/15 A BROWNING    Culture ENTEROBACTER SPECIES CANDIDA ALBICANS   Final   Report Status 01/18/2015 FINAL  Final   Organism ID, Bacteria ENTEROBACTER SPECIES  Final  Susceptibility   Enterobacter species - MIC*    CEFAZOLIN >=64 RESISTANT Resistant     CEFEPIME 2 SENSITIVE Sensitive     CEFTAZIDIME >=64 RESISTANT Resistant     CEFTRIAXONE >=64 RESISTANT Resistant     CIPROFLOXACIN <=0.25 SENSITIVE Sensitive     GENTAMICIN <=1 SENSITIVE Sensitive     IMIPENEM <=0.25 SENSITIVE Sensitive     TRIMETH/SULFA <=20 SENSITIVE Sensitive     PIP/TAZO >=128 RESISTANT Resistant     * ENTEROBACTER SPECIES  Gram stain     Status: None   Collection Time: 01/15/15  1:55 PM  Result Value Ref Range Status   Specimen Description BILE  Final   Special Requests NONE  Final   Gram Stain   Final    YEAST Gram Stain Report Called to,Read Back By and Verified With: A [facility] RN [id number] 01/15/15  A  BROWNING    Report Status 01/15/2015 FINAL  Final     Labs: Basic Metabolic Panel:  Recent Labs Lab 01/17/15 0519 01/19/15 0458 01/20/15 0451 01/21/15 0517 01/22/15 1515  NA 130* 132* 130* 128* 129*  K 3.9 3.8 4.0 4.1 4.5  CL 94* 96* 95* 94* 95*  CO2 27 25 26 24 22   GLUCOSE 123* 128* 127* 130* 187*  BUN 27* 32* 34* 30* 40*  CREATININE 1.37* 1.44* 1.44* 1.45* 1.71*  CALCIUM 8.7* 8.7* 8.6* 8.5* 8.6*   Liver Function Tests:  Recent Labs Lab 01/17/15 0519 01/19/15 0458 01/20/15 0451 01/21/15 0517 01/22/15 1515  AST 189* 217* 230* 253* 316*  ALT 129* 163* 178* 190* 212*  ALKPHOS 376* 319* 297* 291* 279*  BILITOT 19.3* 15.5* 14.0* 13.2* 14.3*  PROT 5.5* 5.6* 5.8* 5.6* 5.6*  ALBUMIN 1.6* 1.6* 1.6* 1.6* 1.6*   No results for input(s): LIPASE, AMYLASE in the last 168 hours. No results for input(s): AMMONIA in the last 168 hours. CBC:  Recent Labs Lab 01/17/15 0519 01/18/15 0512 01/21/15 0517  WBC 9.9 12.3* 15.3*  HGB 7.7* 7.9* 8.0*  HCT 22.2* 22.6* 23.5*  MCV 83.8 84.0 84.5  PLT 340 338 379   Cardiac Enzymes: No results for input(s): CKTOTAL, CKMB, CKMBINDEX, TROPONINI in the last 168 hours. BNP: BNP (last 3 results) No results for input(s): BNP in the last 8760 hours.  ProBNP (last 3 results) No results for input(s): PROBNP in the last 8760 hours.  CBG: No results for input(s): GLUCAP in the last 168 hours.   Signed:  Kynan Peasley, Orpah Melter  [facility] 01/22/2015, 4:50 PM

## 2015-01-22 NOTE — Sedation Documentation (Signed)
02 d/c 

## 2015-01-23 ENCOUNTER — Telehealth: Payer: Self-pay

## 2015-01-23 NOTE — Telephone Encounter (Signed)
  Oncology Nurse Navigator Documentation      Patient Visit Type: Follow-up (01/23/15 1200)                    Time Spent with Patient: 15 (01/23/15 1200)   Notified of appt 10/17 at 1545 with Dr [name]

## 2015-01-23 NOTE — Telephone Encounter (Signed)
  Oncology Nurse Navigator Documentation      Patient Visit Type: Follow-up (01/23/15 1000)                    Time Spent with Patient: 15 (01/23/15 1000)   Geoffrey Gregory is home from the hospital. States he is feeling really weak. Wants me to call back after lunch when his wife his home to give him his appt with Dr [name].

## 2015-01-24 ENCOUNTER — Ambulatory Visit: Payer: Medicare HMO

## 2015-01-26 ENCOUNTER — Inpatient Hospital Stay (HOSPITAL_BASED_OUTPATIENT_CLINIC_OR_DEPARTMENT_OTHER): Payer: Medicare HMO | Admitting: Oncology

## 2015-01-26 ENCOUNTER — Encounter: Payer: Self-pay | Admitting: Oncology

## 2015-01-26 ENCOUNTER — Ambulatory Visit: Payer: Medicare HMO

## 2015-01-26 VITALS — BP 106/72 | HR 90 | Temp 95.7°F | Wt 132.0 lb

## 2015-01-26 DIAGNOSIS — E871 Hypo-osmolality and hyponatremia: Secondary | ICD-10-CM

## 2015-01-26 DIAGNOSIS — Z85818 Personal history of malignant neoplasm of other sites of lip, oral cavity, and pharynx: Secondary | ICD-10-CM

## 2015-01-26 DIAGNOSIS — K831 Obstruction of bile duct: Secondary | ICD-10-CM | POA: Diagnosis not present

## 2015-01-26 DIAGNOSIS — R17 Unspecified jaundice: Secondary | ICD-10-CM | POA: Diagnosis not present

## 2015-01-26 DIAGNOSIS — Z79899 Other long term (current) drug therapy: Secondary | ICD-10-CM

## 2015-01-26 DIAGNOSIS — C250 Malignant neoplasm of head of pancreas: Secondary | ICD-10-CM

## 2015-01-26 DIAGNOSIS — I10 Essential (primary) hypertension: Secondary | ICD-10-CM | POA: Diagnosis not present

## 2015-01-26 DIAGNOSIS — Z7982 Long term (current) use of aspirin: Secondary | ICD-10-CM | POA: Diagnosis not present

## 2015-01-26 DIAGNOSIS — I2510 Atherosclerotic heart disease of native coronary artery without angina pectoris: Secondary | ICD-10-CM | POA: Diagnosis not present

## 2015-01-26 DIAGNOSIS — Z87891 Personal history of nicotine dependence: Secondary | ICD-10-CM | POA: Diagnosis not present

## 2015-01-26 DIAGNOSIS — I714 Abdominal aortic aneurysm, without rupture: Secondary | ICD-10-CM | POA: Diagnosis not present

## 2015-01-26 DIAGNOSIS — E559 Vitamin D deficiency, unspecified: Secondary | ICD-10-CM | POA: Diagnosis not present

## 2015-01-26 DIAGNOSIS — E785 Hyperlipidemia, unspecified: Secondary | ICD-10-CM | POA: Diagnosis not present

## 2015-01-26 DIAGNOSIS — E119 Type 2 diabetes mellitus without complications: Secondary | ICD-10-CM | POA: Diagnosis not present

## 2015-01-26 DIAGNOSIS — C259 Malignant neoplasm of pancreas, unspecified: Secondary | ICD-10-CM

## 2015-01-26 LAB — CBC WITH DIFFERENTIAL/PLATELET
BASOS PCT: 0 %
Basophils Absolute: 0 10*3/uL (ref 0–0.1)
EOS ABS: 0 10*3/uL (ref 0–0.7)
EOS PCT: 0 %
HCT: 28.1 % — ABNORMAL LOW (ref 40.0–52.0)
Hemoglobin: 9 g/dL — ABNORMAL LOW (ref 13.0–18.0)
LYMPHS ABS: 0.5 10*3/uL — AB (ref 1.0–3.6)
Lymphocytes Relative: 3 %
MCH: 28.7 pg (ref 26.0–34.0)
MCHC: 32 g/dL (ref 32.0–36.0)
MCV: 89.8 fL (ref 80.0–100.0)
MONO ABS: 0.9 10*3/uL (ref 0.2–1.0)
MONOS PCT: 7 %
NEUTROS PCT: 90 %
Neutro Abs: 12.5 10*3/uL — ABNORMAL HIGH (ref 1.4–6.5)
PLATELETS: 564 10*3/uL — AB (ref 150–440)
RBC: 3.12 MIL/uL — ABNORMAL LOW (ref 4.40–5.90)
RDW: 21.7 % — AB (ref 11.5–14.5)
WBC: 14 10*3/uL — ABNORMAL HIGH (ref 3.8–10.6)

## 2015-01-26 LAB — COMPREHENSIVE METABOLIC PANEL
ALBUMIN: 2.1 g/dL — AB (ref 3.5–5.0)
ALK PHOS: 777 U/L — AB (ref 38–126)
ALT: 285 U/L — ABNORMAL HIGH (ref 17–63)
ANION GAP: 12 (ref 5–15)
AST: 462 U/L — ABNORMAL HIGH (ref 15–41)
BUN: 58 mg/dL — ABNORMAL HIGH (ref 6–20)
CALCIUM: 9 mg/dL (ref 8.9–10.3)
CO2: 22 mmol/L (ref 22–32)
Chloride: 95 mmol/L — ABNORMAL LOW (ref 101–111)
Creatinine, Ser: 1.73 mg/dL — ABNORMAL HIGH (ref 0.61–1.24)
GFR calc non Af Amer: 36 mL/min — ABNORMAL LOW (ref >60)
GFR, EST AFRICAN AMERICAN: 41 mL/min — AB (ref >60)
GLUCOSE: 153 mg/dL — AB (ref 65–99)
POTASSIUM: 4.4 mmol/L (ref 3.5–5.1)
SODIUM: 129 mmol/L — AB (ref 135–145)
TOTAL PROTEIN: 6.8 g/dL (ref 6.5–8.1)
Total Bilirubin: 12.3 mg/dL — ABNORMAL HIGH (ref 0.3–1.2)

## 2015-01-26 MED ORDER — PREDNISONE 20 MG PO TABS: 20.0000 mg | ORAL_TABLET | Freq: Every day | ORAL | Status: AC

## 2015-01-26 NOTE — Progress Notes (Signed)
Patient discharged from hospital last  Thursday due to elevated bilirubin.  States he is having problems swallowing.  Occasionally coughs up thick mucus.

## 2015-01-28 ENCOUNTER — Ambulatory Visit: Payer: Medicare HMO

## 2015-01-30 ENCOUNTER — Inpatient Hospital Stay: Payer: Medicare HMO

## 2015-02-03 ENCOUNTER — Inpatient Hospital Stay: Payer: Medicare HMO | Admitting: Oncology

## 2015-02-04 ENCOUNTER — Ambulatory Visit: Payer: Medicare HMO | Admitting: Oncology

## 2015-02-08 ENCOUNTER — Encounter: Payer: Self-pay | Admitting: Oncology

## 2015-02-08 NOTE — Progress Notes (Signed)
[place] @ [facility] [phone]  [phone]     Geoffrey Gregory OB: 1932-08-15  MR#: [id number]  JAS#:[id number]  Patient Care Team: Jerrol Banana., MD as PCP - General (Family Medicine) Seeplaputhur Robinette Haines, MD (General Surgery) Clent Jacks, RN as Registered Nurse Forest Gleason, MD (Oncology)  CHIEF COMPLAINT:  Chief Complaint  Patient presents with  . OTHER   Oncology History   81 year old male referred by [facility]  with newly diagnosed left tonsillar squamous cell cancer.patient had presented to Dr. Rosanna Randy for left-sided throat pain and his wife had noted some mild swelling in his neck.  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  Had been previously treated with oral antibiotics .remote history of smoking that he quit in 1999. Patient underwent fiberoptic laryngoscopy on 05/09/2013 with findings of left posterior displacement fullness inferior aspect of the left nasopharynxwith findings highly suspicious for tonsillar squamous cell carcinoma. Dr. Carlis Abbott noted  tonsillar mass is 3 x 3 cm also noted a discrete left neck nodule 1.5 x 2 and centimeters, was not able to be removed due to extension beyond the tonsillar fossa      2.  Recent diagnosis of obstructive jaundice with pancreatic cancer Multiple admission in [facility] in the [place] Status post stent placement.  ERCP showing biopsy result to be adenocarcinoma (September of 2016)  Oncology Flowsheet 01/08/2015 01/16/2015 01/17/2015 01/18/2015 01/19/2015 01/22/2015  dexamethasone (DECADRON) IJ - - - - - -  enoxaparin (LOVENOX) [place] - 40 mg 40 mg 40 mg 40 mg 40 mg    INTERVAL HISTORY:  Patient was recently admitted in the hospital with obstructive jaundice O on transferred to [facility] for ERCP and stent placement.  Patient had a prolonged hospital stay and multiple stent placement.  Patient's condition continues  to decline.  Remains very jaundice.  Poor appetite.  Declining performance status.  Patient also has hyponatremia other electrolyte imbalance in wheelchair  REVIEW OF SYSTEMS:   Gen. status: Declining performance status in wheelchair losing weight GI: Discomfort in the right upper quadrant poor appetite persistent nausea GU: No dysuria hematuria HEENT: No difficulty swallowing Lower extremity swelling Skin: No rash Neurological system no headache no dizziness no other focal signs All other 12 systems have been reviewed As per HPI. Otherwise, a complete review of systems is negatve.  PAST MEDICAL HISTORY: Past Medical History  Diagnosis Date  . Hyperlipidemia   . Cancer [facility]) Feb 2015    tonsil, chemo/radiation  . Hypertension   . Diabetes mellitus without complication ([place])   . CAD (coronary artery disease)   . Vitamin D deficiency   . AAA (abdominal aortic aneurysm) ([place])     PAST SURGICAL HISTORY: Past Surgical History  Procedure Laterality Date  . Portacath placement  Fe 2015    [name]  . Axillary lymph node biopsy Left 11-28-13    atyical squamous cells  . Carotid stent  1999  . Hernia repair Bilateral 10-06-00    inguinal/ Dr. [name]  . Lymph node dissection Left 12-30-13    axilla  . Endoscopic retrograde cholangiopancreatography (ercp) with propofol N/A 01/08/2015    Procedure: ENDOSCOPIC RETROGRADE CHOLANGIOPANCREATOGRAPHY (ERCP) WITH PROPOFOL;  Surgeon: Lucilla Lame, MD;  Location: [facility] ENDOSCOPY;  Service: Endoscopy;  Laterality: N/A;    FAMILY HISTORY Family History  Problem Relation Age of Onset  . Diabetes Mother   . Heart disease Mother   .  Stroke Sister   . Kidney disease Sister     kidney failure  . Hypertension Brother     ADVANCED DIRECTIVES:  No flowsheet data found.  HEALTH MAINTENANCE: Social History  Substance Use Topics  . Smoking status: Former Smoker -- 1.00 packs/day for 15 years    Types: Cigarettes    Quit date: 04/11/1997  .  Smokeless tobacco: Never Used  . Alcohol Use: No      No Known Allergies  Current Outpatient Prescriptions  Medication Sig Dispense Refill  . aspirin 81 MG tablet Take by mouth daily.    . cholecalciferol (VITAMIN D) 1000 UNITS tablet Take 1,000 Units by mouth daily.    . ciprofloxacin (CIPRO) 500 MG tablet Take 1 tablet (500 mg total) by mouth daily with breakfast. 7 tablet 0  . fluconazole (DIFLUCAN) 200 MG tablet Take 1 tablet (200 mg total) by mouth daily. 7 tablet 0  . furosemide (LASIX) 20 MG tablet Take 1 tablet (20 mg total) by mouth daily. 30 tablet 0  . HYDROcodone-acetaminophen (NORCO/VICODIN) 5-325 MG tablet Take 1-2 tablets by mouth every 4 (four) hours as needed for moderate pain. 20 tablet 0  . metoprolol tartrate (LOPRESSOR) 25 MG tablet Take 1 tablet (25 mg total) by mouth 2 (two) times daily. 60 tablet 0  . omeprazole (PRILOSEC) 40 MG capsule Take 40 mg by mouth daily.     . sodium chloride 1 G tablet Take 1 tablet (1 g total) by mouth 2 (two) times daily with a meal. 60 tablet 0  . predniSONE (DELTASONE) 20 MG tablet Take 1 tablet (20 mg total) by mouth daily with breakfast. 30 tablet 3   No current facility-administered medications for this visit.   Facility-Administered Medications Ordered in Other Visits  Medication Dose Route Frequency Provider Last Rate Last Dose  . sodium chloride 0.9 % injection 10 mL  10 mL Intravenous PRN Evlyn Kanner, NP   10 mL at 12/05/14 1103    OBJECTIVE:  Filed Vitals:   01/26/15 1603  BP: 106/72  Pulse: 90  Temp: 95.7 F (35.4 C)     Body mass index is 20.67 kg/(m^2).    ECOG FS:2 - Symptomatic, <50% confined to bed  PHYSICAL EXAM: GENERAL:  Patient is in wheelchair.  Cachectic has lost a lot of weight not any acute distress MENTAL STATUS:  Alert and oriented to person, place and time.  EYES:  .  Pupils equal round and reactive to light and accomodation.  Jaundice ENT:  Oropharynx clear without lesion.  Tongue normal.  Mucous membranes moist.  RESPIRATORY:  Clear to auscultation without rales, wheezes or rhonchi. CARDIOVASCULAR:  Regular rate and rhythm without murmur, rub or gallop. BREAST:  Right breast without masses, skin changes or nipple discharge.  Left breast without masses, skin changes or nipple discharge. ABDOMEN:  Soft, -tender, with active bowel sounds, and no hepatosplenomegaly.  No masses. Tenderness in the right upper quadrant with a palpable mass BACK:  No CVA tenderness.  No tenderness on percussion of the back or rib cage. SKIN:  No rashes, ulcers or lesions. EXTREMITIES: No edema, no skin discoloration or tenderness.  No palpable cords. LYMPH NODES: No palpable cervical, supraclavicular, axillary or inguinal adenopathy  NEUROLOGICAL: Unremarkable. PSYCH:  Appropriate.   LAB RESULTS:  CBC Latest Ref Rng 01/26/2015 01/21/2015  WBC 3.8 - 10.6 K/uL 14.0(H) 15.3(H)  Hemoglobin 13.0 - 18.0 g/dL 9.0(L) 8.0(L)  Hematocrit 40.0 - 52.0 % 28.1(L) 23.5(L)  Platelets 150 -  440 K/uL 564(H) 379    Appointment on 01/26/2015  Component Date Value Ref Range Status  . WBC 01/26/2015 14.0* 3.8 - 10.6 K/uL Final  . RBC 01/26/2015 3.12* 4.40 - 5.90 MIL/uL Final  . Hemoglobin 01/26/2015 9.0* 13.0 - 18.0 g/dL Final  . HCT 01/26/2015 28.1* 40.0 - 52.0 % Final  . MCV 01/26/2015 89.8  80.0 - 100.0 fL Final  . MCH 01/26/2015 28.7  26.0 - 34.0 pg Final  . MCHC 01/26/2015 32.0  32.0 - 36.0 g/dL Final  . RDW 01/26/2015 21.7* 11.5 - 14.5 % Final  . Platelets 01/26/2015 564* 150 - 440 K/uL Final  . Neutrophils Relative % 01/26/2015 90   Final  . Neutro Abs 01/26/2015 12.5* 1.4 - 6.5 K/uL Final  . Lymphocytes Relative 01/26/2015 3   Final  . Lymphs Abs 01/26/2015 0.5* 1.0 - 3.6 K/uL Final  . Monocytes Relative 01/26/2015 7   Final  . Monocytes Absolute 01/26/2015 0.9  0.2 - 1.0 K/uL Final  . Eosinophils Relative 01/26/2015 0   Final  . Eosinophils Absolute 01/26/2015 0.0  0 - 0.7 K/uL Final  . Basophils  Relative 01/26/2015 0   Final  . Basophils Absolute 01/26/2015 0.0  0 - 0.1 K/uL Final  . Sodium 01/26/2015 129* 135 - 145 mmol/L Final  . Potassium 01/26/2015 4.4  3.5 - 5.1 mmol/L Final  . Chloride 01/26/2015 95* 101 - 111 mmol/L Final  . CO2 01/26/2015 22  22 - 32 mmol/L Final  . Glucose, Bld 01/26/2015 153* 65 - 99 mg/dL Final  . BUN 01/26/2015 58* 6 - 20 mg/dL Final  . Creatinine, Ser 01/26/2015 1.73* 0.61 - 1.24 mg/dL Final  . Calcium 01/26/2015 9.0  8.9 - 10.3 mg/dL Final  . Total Protein 01/26/2015 6.8  6.5 - 8.1 g/dL Final  . Albumin 01/26/2015 2.1* 3.5 - 5.0 g/dL Final  . AST 01/26/2015 462* 15 - 41 U/L Final  . ALT 01/26/2015 285* 17 - 63 U/L Final  . Alkaline Phosphatase 01/26/2015 777* 38 - 126 U/L Final  . Total Bilirubin 01/26/2015 12.3* 0.3 - 1.2 mg/dL Final  . GFR calc non Af Amer 01/26/2015 36* >60 mL/min Final  . GFR calc Af Amer 01/26/2015 41* >60 mL/min Final   Comment: (NOTE) The eGFR has been calculated using the CKD EPI equation. This calculation has not been validated in all clinical situations. eGFR's persistently <60 mL/min signify possible Chronic Kidney Disease.   . Anion gap 01/26/2015 12  5 - 15 Final  Admission on 01/09/2015, Discharged on 01/22/2015  No results displayed because visit has over 200 results.         STUDIES: Ct Abdomen Pelvis Wo Contrast  01/16/2015  CLINICAL DATA:  Cancer ([place]) C80.1 (ICD-10-CM). Patient originally presented with painless jaundice, weakness and mellitus. Patient had a pancreatic MRI, not currently available for review, demonstrating a pancreatic mass obstructing the common bile duct. Patient with elevated bilirubin as high as 19. Also was a history of left tonsillar squamous cell carcinoma. EXAM: CT CHEST, ABDOMEN AND PELVIS WITHOUT CONTRAST TECHNIQUE: Multidetector CT imaging of the chest, abdomen and pelvis was performed following the standard protocol without IV contrast. COMPARISON:  PET-CT, 11/11/2013.  Chest  CT, 05/06/2014. FINDINGS: CT CHEST FINDINGS Thoracic inlet and axilla. There is left axillary adenopathy, largest node measuring 12 mm in short axis. There is intervening inflammatory type stranding. There is increased density in the retroareolar region of the left breast, most likely gynecomastia. Axillary adenopathy was  present previously. Apparent gynecomastia is new. No neck base masses or adenopathy. Mediastinum and hila: Focal bulge from the aortic arch is stable from the prior exam. Heart is normal in size. There are coronary artery calcifications, also stable. Mildly enlarged precarinal lymph node measuring 13 mm, previously subcentimeter. No hilar masses or adenopathy. Lungs and pleura: 5 mm left lower lobe pulmonary nodule, image 45, series 4, with associated linear opacity. Nodule is new or increased in size. Linear opacity, likely scarring, stable. No other discrete nodules. Small right pleural effusion and minimal left pleural effusion. Mild lung base linear/reticular opacity is noted consistent subsegmental atelectasis. No lung consolidation or edema. There are moderate changes of centrilobular emphysema. CT ABDOMEN AND PELVIS FINDINGS Percutaneous biliary stent extends from the upper anterior abdominal wall to the region of the porta hepatis into the common bile duct passing through the ampulla of Vater into the third portion of the duodenum. There is some intrahepatic biliary air. There is prominence of the pancreatic head. Is no defined mass. Abnormal hypo attenuating soft tissue or fluid extends along the gastrohepatic ligament. Mild inflammatory type stranding lies adjacent to the distal stomach and pancreatic head as well as the duodenum and along the anterior para renal fascia. A trace amount of ascites is seen along the anterior perirenal fascia, adjacent to the liver and along the right pericolic gutter extending into the posterior pelvic recess. Liver: No mass or focal lesion. Low density  consistent with edema extends along the portal branches. Spleen:  Unremarkable. Gallbladder: Dense material lies within the gallbladder. No evidence of acute cholecystitis. Adrenal glands:  No masses. Kidneys, ureters, bladder: 2.9 cm low-density mass projects medially from the mid to lower pole left kidney, likely a cyst. No other renal masses. No collecting system stones. No hydronephrosis. Ureters normal in course and in caliber. Bladder is unremarkable. Prostate gland:  Enlarged measuring 5.4 x 4.3 cm transversely. Lymph nodes: Multiple prominent lymph nodes. These are most evident in the upper abdomen and in the peritoneal cavity. There is a right lower quadrant mesenteric node measuring 10 mm in short axis. Focal soft tissue nodules anterior to the distal stomach below the left lobe of the liver are noted which may reflect additional lymph nodes, largest measuring 11 mm in short axis. There is more a ill-defined soft tissue nodularity in the teres celiac region extending toward the porta hepatis and pancreatic head likely also adenopathy. Vascular: Patient has an aortic stent graft excluding and infrarenal abdominal aortic aneurysm. Native aneurysm measures 4.9 x 5.6 cm. No evidence of rupture. Gastrointestinal: Mild prominence of the colon and loops small bowel several with air-fluid levels. A mild adynamic ileus is suspected. There is no bowel wall thickening. There is no evidence of obstruction. Uninflamed diverticula are noted along the left colon. Musculoskeletal:  No osteoblastic or osteolytic lesions. IMPRESSION: 1. Pancreatic head mass is not well-defined on the unenhanced images. There is pancreatic head fullness. Abnormal low attenuation soft tissue extends from the pancreatic head along the porta hepatis, which may reflect edema. Some of this may be infiltrating tumor. There is shotty adenopathy in the upper abdomen along the periceliac chain, gastrohepatic ligament and in the peripancreatic region.  There is additional adenopathy in the peritoneal cavity most prominent in the right mid to lower quadrant. There is also abnormal soft tissue nodules in the anterior upper abdomen just below the left lobe of the liver, which may reflect additional adenopathy or peritoneal/omental carcinoma deposits. Peritoneal spread of carcinoma is  suspected. Small amount of ascites. 2. Well-positioned biliary stent, percutaneously placed. No significant intrahepatic bile duct dilation. 3. Mild prominence of the colon small bowel. Mild adynamic ileus is suspected. No bowel obstruction or bowel inflammation. 4. Small right minimal left pleural effusions. 5. 5 mm left lower lobe pulmonary nodule. This could reflect neoplastic disease or be due to scarring. The nodule appears new or increased compared the prior CT. 6. Left axillary adenopathy similar to the prior CT. 7. Increased soft tissue in the retroareolar region of the left breast. This most likely gynecomastia, but is new. Consider follow-up with mammography if this patient has a firm palpable lump in this region. 8. Chronic findings include emphysema, changes from aortic aneurysm surgery and prostatic enlargement. Electronically Signed   By: Lajean Manes M.D.   On: 01/16/2015 15:19   Ct Soft Tissue Neck Wo Contrast  01/16/2015  CLINICAL DATA:  Personal history of pancreatic and tonsillar cancer. Tonsillar cancer was on the left. Interval follow-up. EXAM: CT NECK WITHOUT CONTRAST TECHNIQUE: Multidetector CT imaging of the neck was performed following the standard protocol without intravenous contrast. COMPARISON:  05/06/2014 CT.  PET scan 11/11/2013 FINDINGS: No evidence of residual or recurrent tonsillar mass. Mild chronic scarring in the tonsillar region on the left as seen previously. The glottis is largely closed. This could be due to voluntary glottic closure. Glottic/supraglottic mass is not excluded given this appearance however. Along the right side of the trachea  in the upper thoracic region, there is abnormal material which could be mucous most likely. Tracheal mass lesion is not excluded but not favored. Surgical clips are present in the left neck related to previous node dissection or vascular surgery. No enlarged or low-density nodes are demonstrated. Lung apices are clear. There is atherosclerosis of the aorta with a small focal aneurysm of the arch, not completely evaluated today but not visibly change since the previous study. Submandibular glands and parotid glands are normal. Ordinary cervical spondylosis. IMPRESSION: Prominent tissue at the glottis/supraglottic region. This could be voluntary glottic closure. The possibility of a glottic mass does exist. Direct inspection would be advisable. No evidence of recurrent disease in the left tonsillar region. No enlarged lymph nodes. Abnormal material along the right side of the trachea in the upper thoracic region, most likely representing mucus. The possibility of a mass does exist but is less likely. Electronically Signed   By: Nelson Chimes M.D.   On: 01/16/2015 15:39   Ct Chest Wo Contrast  01/16/2015  CLINICAL DATA:  Cancer ([place]) C80.1 (ICD-10-CM). Patient originally presented with painless jaundice, weakness and mellitus. Patient had a pancreatic MRI, not currently available for review, demonstrating a pancreatic mass obstructing the common bile duct. Patient with elevated bilirubin as high as 19. Also was a history of left tonsillar squamous cell carcinoma. EXAM: CT CHEST, ABDOMEN AND PELVIS WITHOUT CONTRAST TECHNIQUE: Multidetector CT imaging of the chest, abdomen and pelvis was performed following the standard protocol without IV contrast. COMPARISON:  PET-CT, 11/11/2013.  Chest CT, 05/06/2014. FINDINGS: CT CHEST FINDINGS Thoracic inlet and axilla. There is left axillary adenopathy, largest node measuring 12 mm in short axis. There is intervening inflammatory type stranding. There is increased density in the  retroareolar region of the left breast, most likely gynecomastia. Axillary adenopathy was present previously. Apparent gynecomastia is new. No neck base masses or adenopathy. Mediastinum and hila: Focal bulge from the aortic arch is stable from the prior exam. Heart is normal in size. There are coronary artery  calcifications, also stable. Mildly enlarged precarinal lymph node measuring 13 mm, previously subcentimeter. No hilar masses or adenopathy. Lungs and pleura: 5 mm left lower lobe pulmonary nodule, image 45, series 4, with associated linear opacity. Nodule is new or increased in size. Linear opacity, likely scarring, stable. No other discrete nodules. Small right pleural effusion and minimal left pleural effusion. Mild lung base linear/reticular opacity is noted consistent subsegmental atelectasis. No lung consolidation or edema. There are moderate changes of centrilobular emphysema. CT ABDOMEN AND PELVIS FINDINGS Percutaneous biliary stent extends from the upper anterior abdominal wall to the region of the porta hepatis into the common bile duct passing through the ampulla of Vater into the third portion of the duodenum. There is some intrahepatic biliary air. There is prominence of the pancreatic head. Is no defined mass. Abnormal hypo attenuating soft tissue or fluid extends along the gastrohepatic ligament. Mild inflammatory type stranding lies adjacent to the distal stomach and pancreatic head as well as the duodenum and along the anterior para renal fascia. A trace amount of ascites is seen along the anterior perirenal fascia, adjacent to the liver and along the right pericolic gutter extending into the posterior pelvic recess. Liver: No mass or focal lesion. Low density consistent with edema extends along the portal branches. Spleen:  Unremarkable. Gallbladder: Dense material lies within the gallbladder. No evidence of acute cholecystitis. Adrenal glands:  No masses. Kidneys, ureters, bladder: 2.9 cm  low-density mass projects medially from the mid to lower pole left kidney, likely a cyst. No other renal masses. No collecting system stones. No hydronephrosis. Ureters normal in course and in caliber. Bladder is unremarkable. Prostate gland:  Enlarged measuring 5.4 x 4.3 cm transversely. Lymph nodes: Multiple prominent lymph nodes. These are most evident in the upper abdomen and in the peritoneal cavity. There is a right lower quadrant mesenteric node measuring 10 mm in short axis. Focal soft tissue nodules anterior to the distal stomach below the left lobe of the liver are noted which may reflect additional lymph nodes, largest measuring 11 mm in short axis. There is more a ill-defined soft tissue nodularity in the teres celiac region extending toward the porta hepatis and pancreatic head likely also adenopathy. Vascular: Patient has an aortic stent graft excluding and infrarenal abdominal aortic aneurysm. Native aneurysm measures 4.9 x 5.6 cm. No evidence of rupture. Gastrointestinal: Mild prominence of the colon and loops small bowel several with air-fluid levels. A mild adynamic ileus is suspected. There is no bowel wall thickening. There is no evidence of obstruction. Uninflamed diverticula are noted along the left colon. Musculoskeletal:  No osteoblastic or osteolytic lesions. IMPRESSION: 1. Pancreatic head mass is not well-defined on the unenhanced images. There is pancreatic head fullness. Abnormal low attenuation soft tissue extends from the pancreatic head along the porta hepatis, which may reflect edema. Some of this may be infiltrating tumor. There is shotty adenopathy in the upper abdomen along the periceliac chain, gastrohepatic ligament and in the peripancreatic region. There is additional adenopathy in the peritoneal cavity most prominent in the right mid to lower quadrant. There is also abnormal soft tissue nodules in the anterior upper abdomen just below the left lobe of the liver, which may  reflect additional adenopathy or peritoneal/omental carcinoma deposits. Peritoneal spread of carcinoma is suspected. Small amount of ascites. 2. Well-positioned biliary stent, percutaneously placed. No significant intrahepatic bile duct dilation. 3. Mild prominence of the colon small bowel. Mild adynamic ileus is suspected. No bowel obstruction or bowel inflammation.  4. Small right minimal left pleural effusions. 5. 5 mm left lower lobe pulmonary nodule. This could reflect neoplastic disease or be due to scarring. The nodule appears new or increased compared the prior CT. 6. Left axillary adenopathy similar to the prior CT. 7. Increased soft tissue in the retroareolar region of the left breast. This most likely gynecomastia, but is new. Consider follow-up with mammography if this patient has a firm palpable lump in this region. 8. Chronic findings include emphysema, changes from aortic aneurysm surgery and prostatic enlargement. Electronically Signed   By: Lajean Manes M.D.   On: 01/16/2015 15:19   Ir Int Lianne Cure Biliary Drain With Cholangiogram  01/10/2015  INDICATION: Obstructing pancreatic mass. Intra and extrahepatic biliary ductal dilatation. Remote history of tonsillar carcinoma with resultant stricturing, complicating attempts at endoscopy and intubation. EXAM: ULTRASOUND AND FLUOROSCOIC GUIDED PERCUTANEOUS TRANSHEPATIC CHOLANGIOGRAM INTERNAL/EXTERNAL BILIARY DRAIN TUBE PLACEMENT BILIARY BRUSH BIOPSY COMPARISON:  MR 01/07/2015 AND EARLIER STUDIES MEDICATIONS: cefazolin 2g preprocedural CONTRAST:  65mL OMNIPAQUE IOHEXOL 300 MG/ML  SOLN ANESTHESIA/SEDATION: Intravenous Fentanyl and Versed were administered as conscious sedation during continuous cardiorespiratory monitoring by the radiology RN, with a total moderate sedation time of 32 minutes. FLUOROSCOPY TIME:  8 minutes 24 seconds, 419   mGy. COMPLICATIONS: None immediate TECHNIQUE: Informed written consent was obtained from Geoffrey Gregory after a discussion  of the risks, benefits and alternatives to treatment. Questions regarding the procedure were encouraged and answered. A timeout was performed prior to the initiation of the procedure. The right upper abdominal quadrant was prepped and draped in the usual sterile fashion, and a sterile drape was applied covering the operative field. Maximum barrier sterile technique with sterile gowns and gloves were used for the procedure. A timeout was performed prior to the initiation of the procedure. Ultrasound scanning of the right upper abdominal quadrant was performed to delineate the anatomy and avoid transgression of the gallbladder or the pleural. An entry site in the right epigastric region was identified . After the overlying soft tissues were anesthetized with 1% Lidocaine , a 21 gauge micropuncture needle was utilized to access the peripheral aspect of an anterior left hepatic duct. A 018 guidewire advanced centrally easily. A 3 French micro dilator was placed over the guidewire and contrast injection confirmed appropriate positioning in the biliary tree. The micro dilator was exchanged for a transitional dilator, exchanged for a Kumpe catheter over a Benson wire. With the use of a regular glide wire, the Kumpe catheter was advanced central to an apparent CBD structure, and ultimately into the distal duodenum. Over an Amplatz stiff wire, the tract was dilated and a 9 [place] long peel-away sheath was advanced. The dilator was removed and biliary brush biopsy of the CBD obstructive region performed x2, submitted to cytology. The peel-away was then exchanged for a 10.2 [place] biliary drainage catheter, advanced with coil ultimately locked within the duodenum. Contrast was injected and a completion radiograph was obtained. The catheter was connected to a drainage bag which yielded the brisk return of clear bile. The catheter was secured to the skin with an 0 Prolene suture and StatLock. The patient tolerated the procedure  well without immediate postprocedural complication. FINDINGS: The initial percutaneous transhepatic cholangiogram demonstrates marked intrahepatic biliary ductal dilatation. The proximal common duct just beyond the biliary confluence is patent. There is a long irregular stricture of the mid and distal CBD, with associated mucosal irregularity. The duodenum is decompressed. Percutaneous biliary brush biopsy of the CBD lesion was performed x2.  28 [place] internal external biliary drain catheter was placed to the duodenum without complication. IMPRESSION: 1. Percutaneous transhepatic cholangiogram demonstrating marked intrahepatic biliary ductal dilatation, long segment CBD irregularity and narrowing. 2. Technically successful brush biopsy of CBD lesion. 3. Technically successful internal external biliary drain catheter placement. The catheter was left to external drainage to maximize biliary decompression. Electronically Signed   By: Lucrezia Europe M.D.   On: 01/10/2015 13:00   Ir Exchange Biliary Drain  01/14/2015  CLINICAL DATA:  81 year old with pancreatic cancer and biliary obstruction. The patient has an internal/external biliary drain. Despite drain placement, the bilirubin has been increasing. Cholangiogram scheduled for evaluation of the tube and biliary system. EXAM: CHOLANGIOGRAM THROUGH EXISTING CATHETER ; EXCHANGE OF PERCUTANEOUS BILIARY CATHETER WITH FLUOROSCOPY Physician: Stephan Minister. Henn, MD FLUOROSCOPY TIME:  1 minutes and 24 seconds, 36.6 mGy MEDICATIONS: None ANESTHESIA/SEDATION: Moderate sedation time: None PROCEDURE: The procedure was explained to the patient. The risks and benefits of the procedure were discussed and the patient's questions were addressed. Informed consent was obtained from the patient. The patient was placed supine on the interventional table. Contrast was injected through the existing catheter. The catheter and surrounding skin were prepped and draped in sterile fashion. Maximal  barrier sterile technique was utilized including caps, mask, sterile gowns, sterile gloves, sterile drape, hand hygiene and skin antiseptic. The retention suture was removed. The catheter was cut and removed over a Bentson wire. A new 10 [place] biliary drain was advanced over the wire and the tip was positioned in the duodenum. Small amount of contrast was injected to confirm placement. Catheter was flushed with saline. A dressing was placed over the catheter site. FINDINGS: The left internal/external biliary drain was stable in position from the prior examination. The intrahepatic ducts are decompressed and appears to be draining both the left and right ducts. The distal aspect of the biliary drain was patent but there was sluggish flow and appeared to be partial occlusion. New catheter was positioned in the biliary system with the tip in the duodenum. New catheter is widely patent following contrast injection. Estimated blood loss: None CONTRAST:  10 mL Omnipaque 595 COMPLICATIONS: None IMPRESSION: The biliary drain was patent but there was sluggish flow through the distal component. Therefore, the biliary drain was exchanged. Continue to follow liver enzymes. Electronically Signed   By: Markus Daft M.D.   On: 01/14/2015 10:34   Ir Biliary Stent(s) Existing Access Inc Dilation Cath Exchange  01/22/2015  INDICATION: Carcinoma of the pancreatic head causing biliary obstruction and status post percutaneous biliary drainage procedure with placement of internal/ external biliary drain via the left lobe that extends into the duodenum. Patient now presents for further assessment and potential placement of internalized common bile duct stent. EXAM: BILIARY STENT PLACEMENT IN COMMON BILE DUCT VIA PRE-EXISTING CATHETER ACCESS WITH CHOLANGIOGRAPHY. COMPARISON:  Imaging during initial catheter placement on 01/10/2015 and catheter exchange on 01/14/2015. MEDICATIONS: 3.375 g IV Zosyn CONTRAST:  24m OMNIPAQUE IOHEXOL 300  MG/ML  SOLN ANESTHESIA/SEDATION: 2.0 mg IV Versed sec, 100 mcg IV fentanyl. Total Moderate Sedation Time 51 minutes FLUOROSCOPY TIME:  6 minutes and 36 seconds. COMPLICATIONS: None TECHNIQUE: Informed written consent was obtained from [employer] a discussion of the risks, benefits and alternatives to treatment. Questions regarding the procedure were encouraged and answered. The right upper abdominal quadrant was prepped and draped in the usual sterile fashion, and a sterile drape was applied covering the operative field. Maximum barrier sterile technique with sterile  gowns and gloves were used for the procedure. A timeout was performed prior to the initiation of the procedure. The pre-existing biliary drainage catheter was prepped. Contrast injection was performed the catheter and initial cholangiogram images obtained. The catheter was then cut and removed over a guidewire. A 7 French sheath was advanced into the biliary system. Additional contrast injection was performed. A 5 French catheter was advanced over a wire into the duodenum. Contrast injection was performed in the duodenal lumen. A guidewire was kinked to estimate appropriate length of biliary stent. The sheath was removed over a guidewire. A 10 mm diameter, 40 mm length Wallflex cover metallic stent was advanced over a guidewire. After positioning of the delivery system, the stent was deployed. Additional cholangiogram images were then obtained after advancement of the sheath back into the biliary tree and contrast injection to assess stent patency. After the procedure percutaneous access into the biliary system was removed entirely. FINDINGS: Initial cholangiogram shows decompressed intrahepatic bile ducts. There is a persistent high-grade stricture/ obstruction of the mid to distal common bile duct. After deployment of the self expanding covered stent, there is excellent flow noted in the stented common bile duct segment into the duodenum.  The stent completely encompasses the strictured segment. Due to good flow through the stented common bile duct, percutaneous access was removed after stent deployment. IMPRESSION: Treatment of common bile duct stricture with placement of a 10 mm x 40 mm covered Wallflex biliary stent. The stented segment is now widely patent with excellent flow of contrast noted. Percutaneous biliary access was removed upon completion of the procedure. Electronically Signed   By: Aletta Edouard M.D.   On: 01/22/2015 18:17   Ir Endoluminal Bx Of Biliary Tree  01/10/2015  INDICATION: Obstructing pancreatic mass. Intra and extrahepatic biliary ductal dilatation. Remote history of tonsillar carcinoma with resultant stricturing, complicating attempts at endoscopy and intubation. EXAM: ULTRASOUND AND FLUOROSCOIC GUIDED PERCUTANEOUS TRANSHEPATIC CHOLANGIOGRAM INTERNAL/EXTERNAL BILIARY DRAIN TUBE PLACEMENT BILIARY BRUSH BIOPSY COMPARISON:  MR 01/07/2015 AND EARLIER STUDIES MEDICATIONS: cefazolin 2g preprocedural CONTRAST:  70m OMNIPAQUE IOHEXOL 300 MG/ML  SOLN ANESTHESIA/SEDATION: Intravenous Fentanyl and Versed were administered as conscious sedation during continuous cardiorespiratory monitoring by the radiology RN, with a total moderate sedation time of 32 minutes. FLUOROSCOPY TIME:  8 minutes 24 seconds, 419   mGy. COMPLICATIONS: None immediate TECHNIQUE: Informed written consent was obtained from [employer] a discussion of the risks, benefits and alternatives to treatment. Questions regarding the procedure were encouraged and answered. A timeout was performed prior to the initiation of the procedure. The right upper abdominal quadrant was prepped and draped in the usual sterile fashion, and a sterile drape was applied covering the operative field. Maximum barrier sterile technique with sterile gowns and gloves were used for the procedure. A timeout was performed prior to the initiation of the procedure. Ultrasound  scanning of the right upper abdominal quadrant was performed to delineate the anatomy and avoid transgression of the gallbladder or the pleural. An entry site in the right epigastric region was identified . After the overlying soft tissues were anesthetized with 1% Lidocaine , a 21 gauge micropuncture needle was utilized to access the peripheral aspect of an anterior left hepatic duct. A 018 guidewire advanced centrally easily. A 3 French micro dilator was placed over the guidewire and contrast injection confirmed appropriate positioning in the biliary tree. The micro dilator was exchanged for a transitional dilator, exchanged for a Kumpe catheter over a Benson wire. With the  use of a regular glide wire, the Kumpe catheter was advanced central to an apparent CBD structure, and ultimately into the distal duodenum. Over an Amplatz stiff wire, the tract was dilated and a 9 [place] long peel-away sheath was advanced. The dilator was removed and biliary brush biopsy of the CBD obstructive region performed x2, submitted to cytology. The peel-away was then exchanged for a 10.2 [place] biliary drainage catheter, advanced with coil ultimately locked within the duodenum. Contrast was injected and a completion radiograph was obtained. The catheter was connected to a drainage bag which yielded the brisk return of clear bile. The catheter was secured to the skin with an 0 Prolene suture and StatLock. The patient tolerated the procedure well without immediate postprocedural complication. FINDINGS: The initial percutaneous transhepatic cholangiogram demonstrates marked intrahepatic biliary ductal dilatation. The proximal common duct just beyond the biliary confluence is patent. There is a long irregular stricture of the mid and distal CBD, with associated mucosal irregularity. The duodenum is decompressed. Percutaneous biliary brush biopsy of the CBD lesion was performed x2. 10 [place] internal external biliary drain catheter was  placed to the duodenum without complication. IMPRESSION: 1. Percutaneous transhepatic cholangiogram demonstrating marked intrahepatic biliary ductal dilatation, long segment CBD irregularity and narrowing. 2. Technically successful brush biopsy of CBD lesion. 3. Technically successful internal external biliary drain catheter placement. The catheter was left to external drainage to maximize biliary decompression. Electronically Signed   By: Lucrezia Europe M.D.   On: 01/10/2015 13:00    ASSESSMENT: Recent diagnosis of pancreatic cancer unresectable T4 N1 M0 tumor in a patient with a previous history of tonsillar cancer Obstructive jaundice status post stent placement without clinically significant decompression bilirubin is still very high Patient's condition is declining performance status is poor  MEDICAL DECISION MAKING:  A prolonged discussion with patient and family that unless patient general condition improved is not a candidate for any chemotherapy and we may have to consider hospice and palliative care Multiple admissions CT scan pathology reports have been reviewed I also had discussion with hospital his was discharging patient from [place] therapy needs to be considered with palliative symptoms Patient and family is going to think about all these options Total duration of visit was 45 minutes.  50% or more time was spent in counseling patient and family regarding prognosis and options of treatment and available resources  Patient expressed understanding and was in agreement with this plan. He also understands that He can call clinic at any time with any questions, concerns, or complaints.    No matching staging information was found for the patient.  Forest Gleason, MD   02/08/2015 11:41 AM

## 2015-02-09 ENCOUNTER — Telehealth: Payer: Self-pay | Admitting: *Deleted

## 2015-02-09 NOTE — Telephone Encounter (Signed)
-----   Message from Forest Gleason, MD sent at 02/08/2015 11:47 AM EDT ----- Regarding: follow up May need to find out about what happened to this patient.  We may have to refer him to hospice.

## 2015-02-09 NOTE — Telephone Encounter (Signed)
Patient deceased

## 2015-02-10 DEATH — deceased

## 2015-04-02 ENCOUNTER — Other Ambulatory Visit: Payer: Medicare HMO

## 2015-04-02 ENCOUNTER — Ambulatory Visit: Payer: Medicare HMO | Admitting: Oncology

## 2015-05-05 ENCOUNTER — Encounter: Payer: Medicare HMO | Admitting: Family Medicine

## 2015-08-07 ENCOUNTER — Ambulatory Visit: Payer: Self-pay | Admitting: Radiation Oncology

## 2017-03-18 IMAGING — MR MR ABDOMEN WO/W CM MRCP
12 of 20 series · 28 of 48 positions shown · IV contrast (multihance)
Comparison: 05/06/2014

CLINICAL DATA: Biliary obstruction.  Jaundice

EXAM:
MRI ABDOMEN WITHOUT AND WITH CONTRAST (INCLUDING MRCP)
TECHNIQUE: Multiplanar multisequence MR imaging of the abdomen was performed
both before and after the administration of intravenous contrast.
Heavily T2-weighted images of the biliary and pancreatic ducts were
obtained, and three-dimensional MRCP images were rendered by post
processing.
CONTRAST:  13mL MULTIHANCE GADOBENATE DIMEGLUMINE 529 MG/ML IV SOLN

[Series 3: T2 fat-sat · axial · 7.0mm · 0.74mm/px · z∈[-135,+67]mm · 2 of 25 slices shown]
[im 1/25]
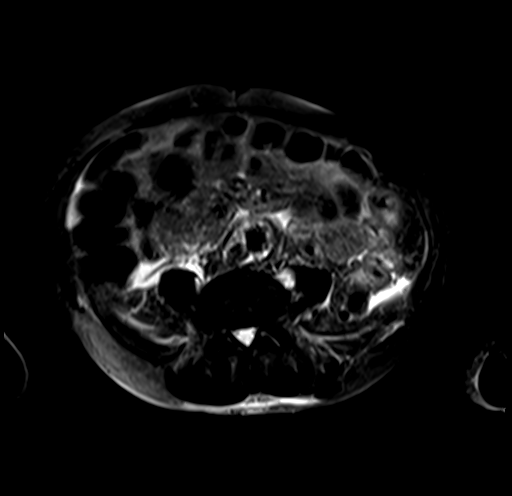
[im 25/25]
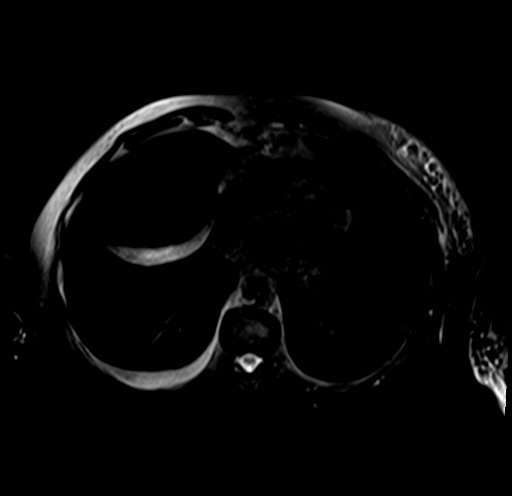

[Series 5: T2 · axial · 7.0mm · 0.74mm/px · z∈[-135,+67]mm · 2 of 25 slices shown]
[im 1/25]
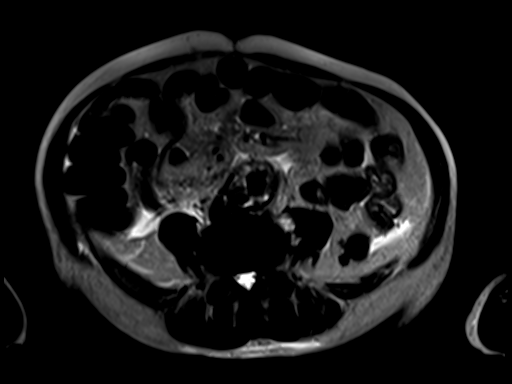
[im 25/25]
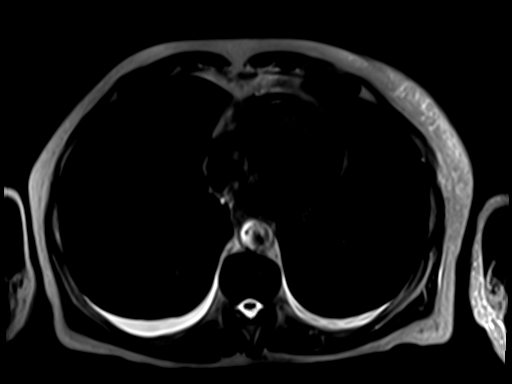

[Series 9: cor thins · coronal · 4.0mm · 0.89mm/px · 1 of 14 slices shown]
[im 1/14]
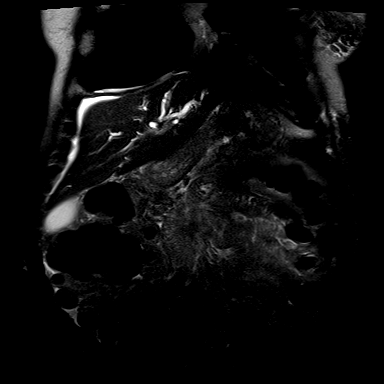

[Series 10: cor tru fisp · coronal · 4.0mm · 0.74mm/px · 2 of 50 slices shown]
[im 1/50]
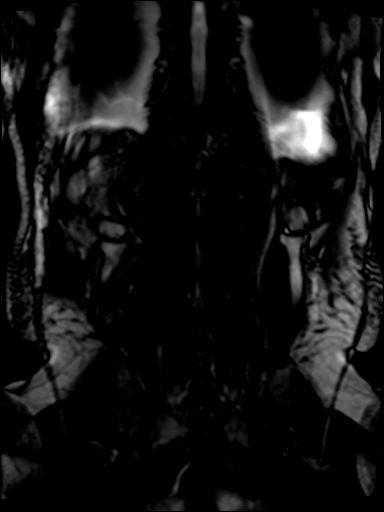
[im 50/50]
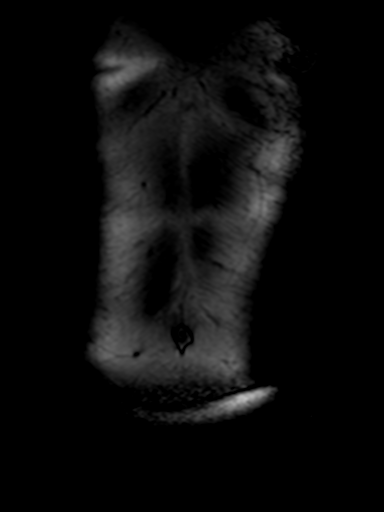

[Series 11: MRCP · coronal · 40.0mm · 0.91mm/px · 1 of 6 slices shown]
[im 1/6]
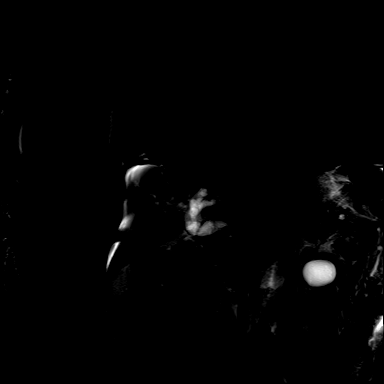

[Series 13: DWI · axial · 6.0mm · 1.98mm/px · z∈[-128,+81]mm · 4 of 86 slices shown]
[im 1/86]
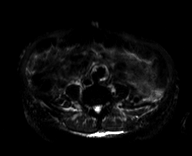
[im 29/86]
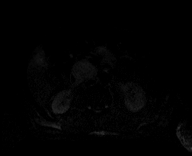
[im 57/86]
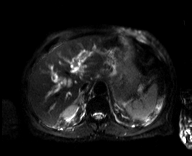
[im 86/86]
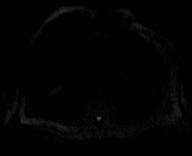

[Series 14: ax dwi_adc · axial · 6.0mm · 1.98mm/px · 1 of 30 slices shown]
[im 1/30]
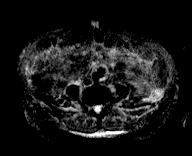

[Series 18: T1 dynamic fat-sat · axial · non-contrast · 2.5mm · 0.74mm/px · z∈[-116,+61]mm · 3 of 72 slices shown (1 of 2)]
[im 1/72]
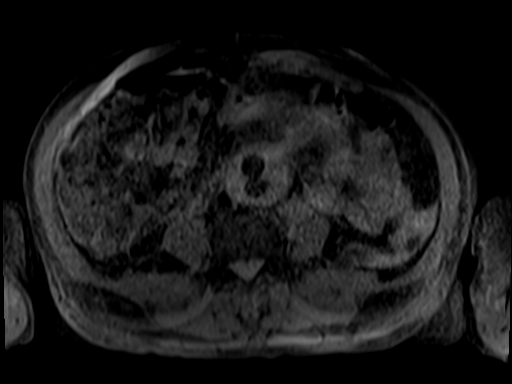
[im 36/72]
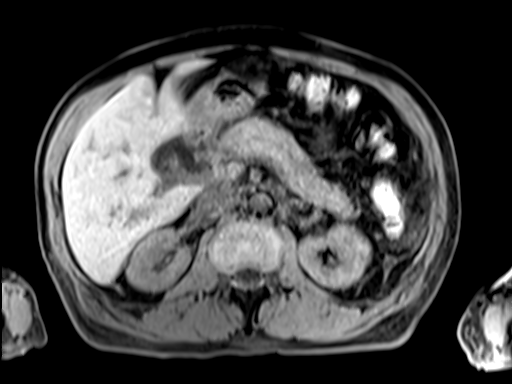
[im 72/72]
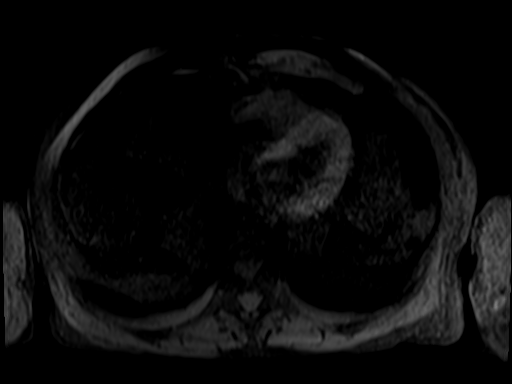

[Series 19: T1 dynamic fat-sat · axial · 2.5mm · 0.74mm/px · z∈[-116,+61]mm · 3 of 72 slices shown (2 of 2)]
[im 1/72]
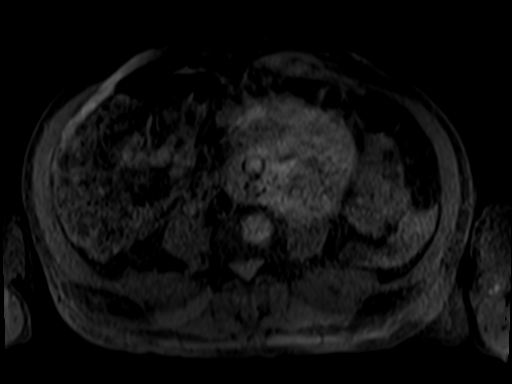
[im 36/72]
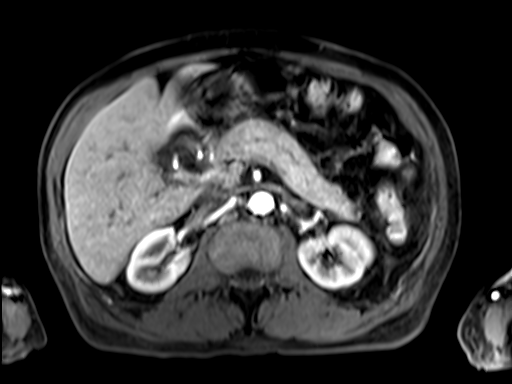
[im 72/72]
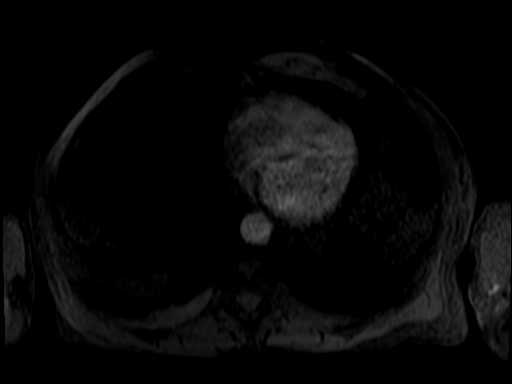

[Series 20: T1 dynamic fat-sat post-contrast · axial · 2.5mm · 0.74mm/px · z∈[-116,+61]mm · 3 of 72 slices shown (1 of 3)]
[im 1/72]
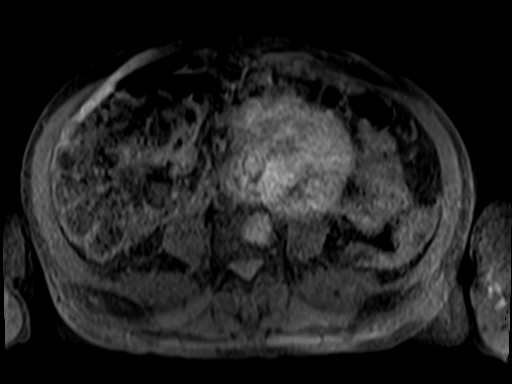
[im 36/72]
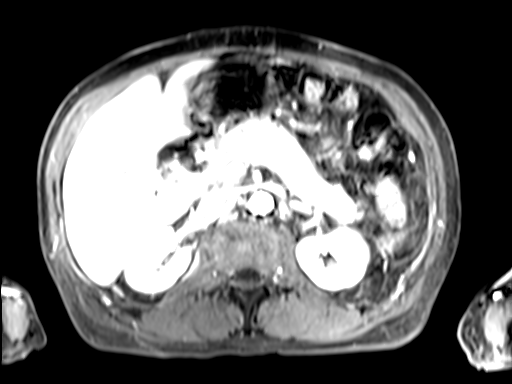
[im 72/72]
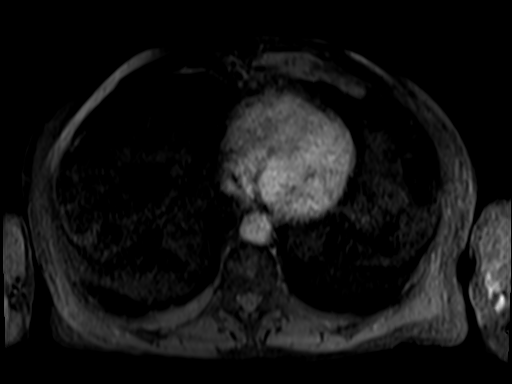

[Series 21: T1 dynamic fat-sat post-contrast · axial · 2.5mm · 0.74mm/px · z∈[-116,+61]mm · 3 of 72 slices shown (2 of 3)]
[im 1/72]
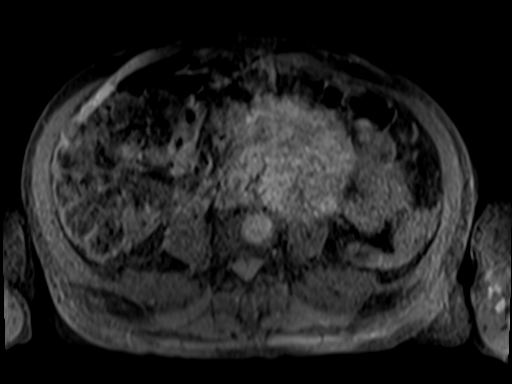
[im 36/72]
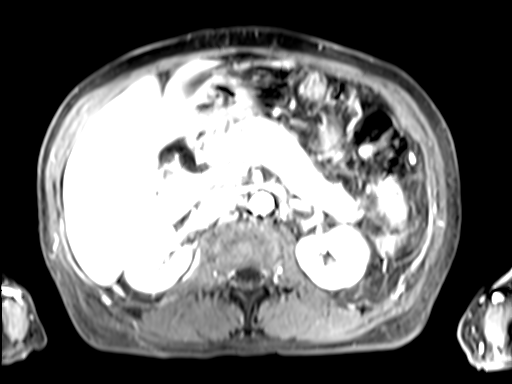
[im 72/72]
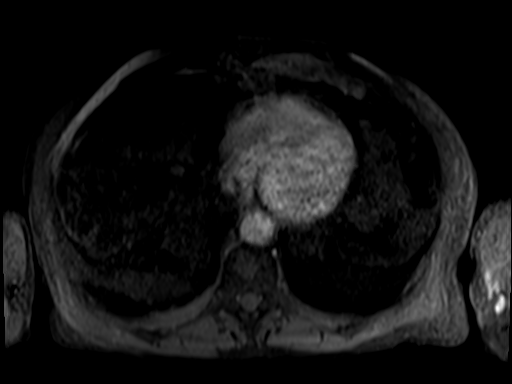

[Series 23: T1 dynamic fat-sat post-contrast · coronal · 2.5mm · 0.82mm/px · 3 of 96 slices shown (3 of 3)]
[im 1/96]
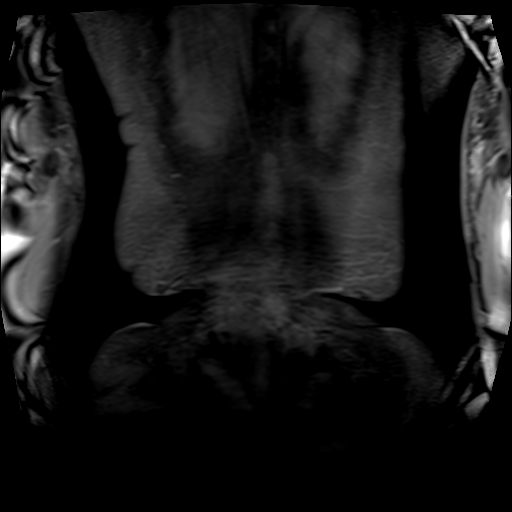
[im 32/96]
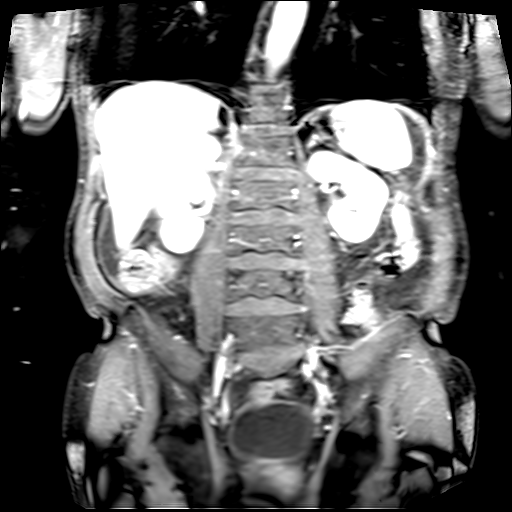
[im 64/96]
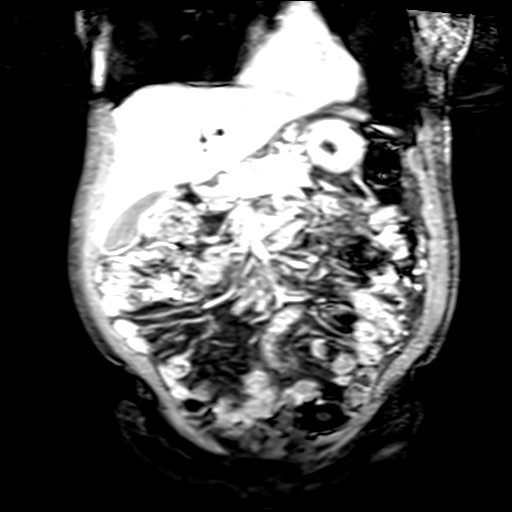

[28 of 48 positions shown; findings below may reference images not displayed]

FINDINGS: Lower chest: Small bilateral pleural effusions are identified. New
from previous study.

Hepatobiliary: No focal liver lesions identified. Marked
intrahepatic bile duct dilatation. The extrahepatic common bile duct
measured 1 cm. Abrupt cut off of the common bile duct is identified.

Pancreas: There is an ill defined mass arising from the head of
pancreas which measures approximately 4.1 x 3.6 x 4.6 cm. The mass
completely encases the portal venous confluence with significant
diminished caliber of the portal vein. Branches of the celiac trunk
appear encased by this mass. The SMA appears patent an unaffected.

Spleen: Normal appearance of the spleen.

Adrenals/Urinary Tract: The adrenal glands are unremarkable.
Bilateral renal cysts noted.

Stomach/Bowel: The stomach and the upper abdominal bowel loops are
unremarkable.

Vascular/Lymphatic: Status post repair of infrarenal abdominal
aortic aneurysm. The aneurysm sac measures 4.6 cm, image 7 of series
13. Previously 5.5 cm. No retroperitoneal adenopathy identified.

Other: There is a small amount of ascites identified within the
upper abdomen.

Musculoskeletal: No specific findings to suggest bone metastasis.
IMPRESSION: 1. Large mass arising from the head of pancreas is identified
worrisome for primary pancreatic neoplasm.
2. There is complete obstruction of the common bile duct. The portal
venous confluence is completely encased by the lesion. There is also
involvement of a branches of the celiac trunk.
3. No specific findings identified to suggest liver metastasis.
